# Patient Record
Sex: Male | Born: 1960
Health system: Southern US, Community
[De-identification: ages and names within clinical notes are randomized; demographics above are authoritative.]

## PROBLEM LIST (undated history)

## (undated) DIAGNOSIS — N401 Enlarged prostate with lower urinary tract symptoms: Secondary | ICD-10-CM

## (undated) DIAGNOSIS — G4730 Sleep apnea, unspecified: Secondary | ICD-10-CM

## (undated) DIAGNOSIS — R739 Hyperglycemia, unspecified: Secondary | ICD-10-CM

## (undated) DIAGNOSIS — I10 Essential (primary) hypertension: Secondary | ICD-10-CM

## (undated) DIAGNOSIS — R21 Rash and other nonspecific skin eruption: Secondary | ICD-10-CM

## (undated) DIAGNOSIS — N5082 Scrotal pain: Secondary | ICD-10-CM

## (undated) DIAGNOSIS — N138 Other obstructive and reflux uropathy: Secondary | ICD-10-CM

## (undated) DIAGNOSIS — R944 Abnormal results of kidney function studies: Secondary | ICD-10-CM

## (undated) DIAGNOSIS — N451 Epididymitis: Secondary | ICD-10-CM

## (undated) DIAGNOSIS — E663 Overweight: Secondary | ICD-10-CM

## (undated) DIAGNOSIS — J309 Allergic rhinitis, unspecified: Secondary | ICD-10-CM

## (undated) DIAGNOSIS — H9209 Otalgia, unspecified ear: Secondary | ICD-10-CM

## (undated) DIAGNOSIS — E291 Testicular hypofunction: Secondary | ICD-10-CM

## (undated) HISTORY — DX: Allergic rhinitis, unspecified: J30.9

## (undated) HISTORY — DX: Benign prostatic hyperplasia with lower urinary tract symptoms: N13.8

## (undated) HISTORY — DX: Rash and other nonspecific skin eruption: R21

## (undated) HISTORY — DX: Hyperglycemia, unspecified: R73.9

## (undated) HISTORY — DX: Testicular hypofunction: E29.1

## (undated) HISTORY — DX: Sleep apnea, unspecified: G47.30

## (undated) HISTORY — DX: Other obstructive and reflux uropathy: N40.1

## (undated) HISTORY — DX: Epididymitis: N45.1

## (undated) HISTORY — DX: Otalgia, unspecified ear: H92.09

## (undated) HISTORY — PX: WISDOM TOOTH EXTRACTION: SHX21

## (undated) HISTORY — DX: Scrotal pain: N50.82

## (undated) HISTORY — DX: Overweight: E66.3

## (undated) HISTORY — DX: Essential (primary) hypertension: I10

## (undated) HISTORY — DX: Abnormal results of kidney function studies: R94.4

---

## 2005-03-31 ENCOUNTER — Emergency Department: Payer: Self-pay | Admitting: Internal Medicine

## 2012-07-01 ENCOUNTER — Ambulatory Visit: Payer: Self-pay | Admitting: Gastroenterology

## 2013-05-19 ENCOUNTER — Encounter: Payer: Self-pay | Admitting: *Deleted

## 2013-05-20 ENCOUNTER — Encounter: Payer: Self-pay | Admitting: Cardiovascular Disease

## 2013-05-20 ENCOUNTER — Encounter (INDEPENDENT_AMBULATORY_CARE_PROVIDER_SITE_OTHER): Payer: Self-pay

## 2013-05-20 ENCOUNTER — Ambulatory Visit (INDEPENDENT_AMBULATORY_CARE_PROVIDER_SITE_OTHER): Payer: BC Managed Care – PPO | Admitting: Cardiovascular Disease

## 2013-05-20 VITALS — BP 116/79 | HR 89 | Ht 73.0 in | Wt 258.5 lb

## 2013-05-20 DIAGNOSIS — R0789 Other chest pain: Secondary | ICD-10-CM

## 2013-05-20 DIAGNOSIS — I10 Essential (primary) hypertension: Secondary | ICD-10-CM | POA: Insufficient documentation

## 2013-05-20 DIAGNOSIS — R0602 Shortness of breath: Secondary | ICD-10-CM

## 2013-05-20 MED ORDER — ASPIRIN 81 MG PO TABS: 81.0000 mg | ORAL_TABLET | Freq: Every day | ORAL | Status: DC

## 2013-05-20 NOTE — Assessment & Plan Note (Signed)
Blood pressure is controlled on current dose of lisinopril.

## 2013-05-20 NOTE — Assessment & Plan Note (Signed)
His exertional chest tightness is suggestive of class II angina. Symptoms have been stable over the last year. Physical exam is normal and baseline ECG is also normal. I advised him to start taking aspirin 81 mg once daily. I recommend proceeding with a stress echocardiogram for risk stratification.

## 2013-05-20 NOTE — Progress Notes (Signed)
Primary care physician: Dr. Jacqualine Code  HPI  This is a pleasant 53 year old man who was referred for evaluation of chest pain. He has no previous cardiac history. He was diagnosed with hypertension about one to 2 years ago and was started on lisinopril. Since that time, he reports exertional dizziness and chest tightness. The chest tightness is usually substernal with no radiation. It happens mainly if he goes up one or 2 flights of stairs or he walks uphill. It does not happen if he is walking on a flat level even brisk walking. The tightness usually last for less than half a minute and resolves with rest. It is associated with dizziness and mild shortness of breath. No orthopnea, PND or lower extremity edema. He reports that the symptoms have been stable over the last year. He is a lifelong nonsmoker and does not drink alcohol. 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 He has no family history of coronary artery disease.  No Known Allergies   Current Outpatient Prescriptions on File Prior to Visit  Medication Sig Dispense Refill  . lisinopril (PRINIVIL,ZESTRIL) 10 MG tablet Take 10 mg by mouth 2 (two) times daily.        No current facility-administered medications on file prior to visit.     Past Medical History  Diagnosis Date  . Sebaceous cyst   . Allergic rhinitis, cause unspecified   . Sprain of lumbar region   . Acute sinusitis, unspecified   . Other abnormal glucose   . Dizziness and giddiness   . Rash and other nonspecific skin eruption   . Hypertension      Past Surgical History  Procedure Laterality Date  . Wisdom tooth extraction       Family History  Problem Relation Age of Onset  . Hypertension Mother   . Diabetes Mother   . Hypertension Father   . Prostate cancer Father      History   Social History  . Marital Status: Married    Spouse Name: N/A    Number of Children: N/A  . Years of Education: N/A   Occupational History  . Not on file.     Social History Main Topics  . Smoking status: Never Smoker   . Smokeless tobacco: Not on file  . Alcohol Use: No     Comment: occasional  . Drug Use: No  . Sexual Activity: Not on file   Other Topics Concern  . Not on file   Social History Narrative  . No narrative on file     ROS A 10 point review of system was performed. It is negative other than that mentioned in the history of present illness.   PHYSICAL EXAM   BP 116/79  Pulse 89  Ht 6\' 1"  (1.854 m)  Wt 258 lb 8 oz (117.255 kg)  BMI 34.11 kg/m2 Constitutional: He is oriented to person, place, and time. He appears well-developed and well-nourished. No distress.  HENT: No nasal discharge.  Head: Normocephalic and atraumatic.  Eyes: Pupils are equal and round.  No discharge. Neck: Normal range of motion. Neck supple. No JVD present. No thyromegaly present.  Cardiovascular: Normal rate, regular rhythm, normal heart sounds. Exam reveals no gallop and no friction rub. No murmur heard.  Pulmonary/Chest: Effort normal and breath sounds normal. No stridor. No respiratory distress. He has no wheezes. He has no rales. He exhibits no tenderness.  Abdominal: Soft. Bowel sounds are normal. He exhibits no distension. There is no  tenderness. There is no rebound and no guarding.  Musculoskeletal: Normal range of motion. He exhibits no edema and no tenderness.  Neurological: He is alert and oriented to person, place, and time. Coordination normal.  Skin: Skin is warm and dry. No rash noted. He is not diaphoretic. No erythema. No pallor.  Psychiatric: He has a normal mood and affect. His behavior is normal. Judgment and thought content normal.       EKG: Normal sinus rhythm with no significant ST or T wave changes.   ASSESSMENT AND PLAN

## 2013-05-20 NOTE — Patient Instructions (Signed)
Your physician has requested that you have a stress echocardiogram within 2 weeks. For further information please visit [email or web address]. Please follow instruction sheet as given.   Your physician has recommended you make the following change in your medication:  Start Aspirin 81 mg daily   Your physician recommends that you schedule a follow-up appointment in:  1 month

## 2013-06-17 ENCOUNTER — Telehealth: Payer: Self-pay | Admitting: *Deleted

## 2013-06-17 NOTE — Telephone Encounter (Signed)
Reviewed stress echo instructions with patient  Patient verbalized understanding  

## 2013-06-17 NOTE — Telephone Encounter (Signed)
LVM to discuss Stress Echo instructions for his 5/28 appt.

## 2013-06-19 ENCOUNTER — Other Ambulatory Visit (INDEPENDENT_AMBULATORY_CARE_PROVIDER_SITE_OTHER): Payer: BC Managed Care – PPO

## 2013-06-19 DIAGNOSIS — R079 Chest pain, unspecified: Secondary | ICD-10-CM

## 2013-06-19 DIAGNOSIS — R0789 Other chest pain: Secondary | ICD-10-CM

## 2013-06-19 DIAGNOSIS — R0602 Shortness of breath: Secondary | ICD-10-CM

## 2013-06-23 ENCOUNTER — Ambulatory Visit: Payer: BC Managed Care – PPO | Admitting: Cardiovascular Disease

## 2013-06-24 ENCOUNTER — Ambulatory Visit: Payer: BC Managed Care – PPO | Admitting: Cardiovascular Disease

## 2014-02-20 ENCOUNTER — Ambulatory Visit: Payer: Self-pay

## 2014-07-20 ENCOUNTER — Encounter (INDEPENDENT_AMBULATORY_CARE_PROVIDER_SITE_OTHER): Payer: Self-pay

## 2014-07-20 ENCOUNTER — Other Ambulatory Visit: Payer: BLUE CROSS/BLUE SHIELD

## 2014-07-20 DIAGNOSIS — E291 Testicular hypofunction: Secondary | ICD-10-CM

## 2014-07-21 ENCOUNTER — Telehealth: Payer: Self-pay

## 2014-07-21 ENCOUNTER — Encounter: Payer: Self-pay | Admitting: Family Medicine

## 2014-07-21 ENCOUNTER — Ambulatory Visit (INDEPENDENT_AMBULATORY_CARE_PROVIDER_SITE_OTHER): Payer: BLUE CROSS/BLUE SHIELD | Admitting: Family Medicine

## 2014-07-21 VITALS — BP 138/88 | HR 103 | Temp 98.3°F | Ht 73.0 in | Wt 275.9 lb

## 2014-07-21 DIAGNOSIS — I10 Essential (primary) hypertension: Secondary | ICD-10-CM

## 2014-07-21 DIAGNOSIS — E291 Testicular hypofunction: Secondary | ICD-10-CM | POA: Diagnosis not present

## 2014-07-21 DIAGNOSIS — N529 Male erectile dysfunction, unspecified: Secondary | ICD-10-CM | POA: Insufficient documentation

## 2014-07-21 DIAGNOSIS — L723 Sebaceous cyst: Secondary | ICD-10-CM

## 2014-07-21 DIAGNOSIS — R7989 Other specified abnormal findings of blood chemistry: Secondary | ICD-10-CM | POA: Insufficient documentation

## 2014-07-21 DIAGNOSIS — S39012A Strain of muscle, fascia and tendon of lower back, initial encounter: Secondary | ICD-10-CM | POA: Insufficient documentation

## 2014-07-21 LAB — TESTOSTERONE: Testosterone: 174 ng/dL — ABNORMAL LOW (ref 348–1197)

## 2014-07-21 MED ORDER — LOSARTAN POTASSIUM 50 MG PO TABS: 50.0000 mg | ORAL_TABLET | Freq: Every day | ORAL | Status: DC

## 2014-07-21 NOTE — Telephone Encounter (Signed)
-----   Message from Nori Riis, PA-C sent at 07/21/2014  8:27 AM EDT ----- Patient's testosterone is still low.  He will need to talk about other options with me at an office visit.

## 2014-07-21 NOTE — Telephone Encounter (Signed)
Spoke with pt and made aware of testosterone. Pt was transferred to the front to make f/u appt. Cw,lpn

## 2014-07-21 NOTE — Progress Notes (Signed)
Name: Seth Meadows   MRN: [id number]    DOB: 12-07-1960   Date:07/21/2014       Progress Note  Subjective  Chief Complaint  Chief Complaint  Patient presents with  . Hypertension    med refill  . Establish Care    Dr Jacqualine Code pt.    Hypertension This is a chronic problem. The problem is unchanged. The problem is controlled (usually controlled at home.). Associated symptoms include malaise/fatigue. Pertinent negatives include no chest pain, headaches, peripheral edema or shortness of breath. Past treatments include ACE inhibitors. The current treatment provides moderate improvement. There are no compliance problems.  There is no history of angina, kidney disease or CAD/MI.   patient was on lisinopril and experienced some side effects, due to which she requested a change in therapy for hypertension. Dr. Jacqualine Code started him on Losartan 50 mg daily but he did not want to change the therapy at that time because he was dealing with medication management for low testosterone levels and did not want to change two medications at one time.    Past Medical History  Diagnosis Date  . Sebaceous cyst   . Allergic rhinitis, cause unspecified   . Sprain of lumbar region   . Acute sinusitis, unspecified   . Other abnormal glucose   . Dizziness and giddiness   . Rash and other nonspecific skin eruption   . Hypertension     Past Surgical History  Procedure Laterality Date  . Wisdom tooth extraction      Family History  Problem Relation Age of Onset  . Hypertension Mother   . Diabetes Mother   . Hypertension Father   . Prostate cancer Father     History   Social History  . Marital Status: Married    Spouse Name: N/A  . Number of Children: N/A  . Years of Education: N/A   Occupational History  . Not on file.   Social History Main Topics  . Smoking status: Never Smoker   . Smokeless tobacco: Not on file  . Alcohol Use: No     Comment: occasional  . Drug Use: No  . Sexual  Activity: Not on file   Other Topics Concern  . Not on file   Social History Narrative     Current outpatient prescriptions:  .  lisinopril (PRINIVIL,ZESTRIL) 10 MG tablet, Take 10 mg by mouth 2 (two) times daily. , Disp: , Rfl:  .  aspirin 81 MG tablet, Take 1 tablet (81 mg total) by mouth daily. (Patient not taking: Reported on 07/21/2014), Disp: 30 tablet, Rfl:   No Known Allergies   Review of Systems  Constitutional: Positive for malaise/fatigue.  Respiratory: Negative for shortness of breath.   Cardiovascular: Negative for chest pain.  Neurological: Negative for headaches.      Objective  Filed Vitals:   07/21/14 1419  BP: 138/88  Pulse: 103  Temp: 98.3 F (36.8 C)  TempSrc: Oral  Height: 6\' 1"  (1.854 m)  Weight: 275 lb 14.4 oz (125.147 kg)  SpO2: 96%    Physical Exam  Constitutional: He is oriented to person, place, and time and well-developed, well-nourished, and in no distress.  HENT:  Head: Normocephalic and atraumatic.  Eyes: Conjunctivae are normal. Pupils are equal, round, and reactive to light.  Cardiovascular: Normal rate and regular rhythm.   Pulmonary/Chest: Effort normal and breath sounds normal.  Abdominal: Soft. Bowel sounds are normal.  Musculoskeletal: He exhibits no edema.  Neurological: He is alert and  oriented to person, place, and time.  Skin: Skin is warm and dry.  Palpable nontender cyst on the upper back, no drainage visible.  Psychiatric: Affect normal.  Nursing note and vitals reviewed.      Recent Results (from the past 2160 hour(s))  Testosterone     Status: Abnormal   Collection Time: 07/20/14  8:44 AM  Result Value Ref Range   Testosterone 174 (L) 348 - 1197 ng/dL   Comment, Testosterone Comment     Comment: Adult male reference interval is based on a population of lean males up to 55 years old.      Assessment & Plan 1. Essential hypertension Patient will be switched to losartan 50 mg daily and he is to DC this  and Toprol and check his blood pressure regularly at home. BP logs will be reviewed in 4 weeks. - losartan (COZAAR) 50 MG tablet; Take 1 tablet (50 mg total) by mouth daily.  Dispense: 90 tablet; Refill: 0 - Comprehensive Metabolic Panel (CMET)  2. Sebaceous cyst Patient will be referred to general surgery for evaluation and possible excision of the cyst. - Ambulatory referral to General Surgery  3. Low testosterone Patient is being followed by urology.  There are no diagnoses linked to this encounter.  Tanganyika Bowlds [facility] 07/21/2014 2:32 PM

## 2014-07-22 ENCOUNTER — Encounter: Payer: Self-pay | Admitting: *Deleted

## 2014-07-22 LAB — COMPREHENSIVE METABOLIC PANEL
ALT: 29 IU/L (ref 0–44)
AST: 27 IU/L (ref 0–40)
Albumin/Globulin Ratio: 1.6 (ref 1.1–2.5)
Albumin: 4.2 g/dL (ref 3.5–5.5)
Alkaline Phosphatase: 57 IU/L (ref 39–117)
BUN/Creatinine Ratio: 15 (ref 9–20)
BUN: 17 mg/dL (ref 6–24)
Bilirubin Total: 0.3 mg/dL (ref 0.0–1.2)
CO2: 22 mmol/L (ref 18–29)
Calcium: 9.1 mg/dL (ref 8.7–10.2)
Chloride: 101 mmol/L (ref 97–108)
Creatinine, Ser: 1.13 mg/dL (ref 0.76–1.27)
GFR calc Af Amer: 84 mL/min/{1.73_m2} (ref >59)
GFR calc non Af Amer: 73 mL/min/{1.73_m2} (ref >59)
Globulin, Total: 2.6 g/dL (ref 1.5–4.5)
Glucose: 91 mg/dL (ref 65–99)
Potassium: 4.4 mmol/L (ref 3.5–5.2)
Sodium: 141 mmol/L (ref 134–144)
Total Protein: 6.8 g/dL (ref 6.0–8.5)

## 2014-07-29 ENCOUNTER — Encounter: Payer: Self-pay | Admitting: *Deleted

## 2014-07-30 ENCOUNTER — Encounter: Payer: Self-pay | Admitting: General Surgery

## 2014-07-30 ENCOUNTER — Ambulatory Visit (INDEPENDENT_AMBULATORY_CARE_PROVIDER_SITE_OTHER): Payer: BLUE CROSS/BLUE SHIELD | Admitting: Urology

## 2014-07-30 ENCOUNTER — Ambulatory Visit (INDEPENDENT_AMBULATORY_CARE_PROVIDER_SITE_OTHER): Payer: BLUE CROSS/BLUE SHIELD | Admitting: General Surgery

## 2014-07-30 ENCOUNTER — Encounter: Payer: Self-pay | Admitting: Urology

## 2014-07-30 VITALS — BP 116/72 | HR 90 | Resp 16 | Ht 73.0 in | Wt 272.0 lb

## 2014-07-30 VITALS — BP 114/76 | HR 98 | Ht 73.0 in | Wt 271.7 lb

## 2014-07-30 DIAGNOSIS — L723 Sebaceous cyst: Secondary | ICD-10-CM | POA: Diagnosis not present

## 2014-07-30 DIAGNOSIS — E291 Testicular hypofunction: Secondary | ICD-10-CM | POA: Diagnosis not present

## 2014-07-30 NOTE — Patient Instructions (Addendum)
Follow up as needed or if symptoms worsen. The patient is aware to call back for any questions or concerns.  Epidermal Cyst An epidermal cyst is sometimes called a sebaceous cyst, epidermal inclusion cyst, or infundibular cyst. These cysts usually contain a substance that looks "pasty" or "cheesy" and may have a bad smell. This substance is a protein called keratin. Epidermal cysts are usually found on the face, neck, or trunk. They may also occur in the vaginal area or other parts of the genitalia of both men and women. Epidermal cysts are usually small, painless, slow-growing bumps or lumps that move freely under the skin. It is important not to try to pop them. This may cause an infection and lead to tenderness and swelling. CAUSES  Epidermal cysts may be caused by a deep penetrating injury to the skin or a plugged hair follicle, often associated with acne. SYMPTOMS  Epidermal cysts can become inflamed and cause:  Redness.  Tenderness.  Increased temperature of the skin over the bumps or lumps.  Grayish-white, bad smelling material that drains from the bump or lump. DIAGNOSIS  Epidermal cysts are easily diagnosed by your caregiver during an exam. Rarely, a tissue sample (biopsy) may be taken to rule out other conditions that may resemble epidermal cysts. TREATMENT   Epidermal cysts often get better and disappear on their own. They are rarely ever cancerous.  If a cyst becomes infected, it may become inflamed and tender. This may require opening and draining the cyst. Treatment with antibiotics may be necessary. When the infection is gone, the cyst may be removed with minor surgery.  Small, inflamed cysts can often be treated with antibiotics or by injecting steroid medicines.  Sometimes, epidermal cysts become large and bothersome. If this happens, surgical removal in your caregiver's office may be necessary. HOME CARE INSTRUCTIONS  Only take over-the-counter or prescription  medicines as directed by your caregiver.  Take your antibiotics as directed. Finish them even if you start to feel better. SEEK MEDICAL CARE IF:   Your cyst becomes tender, red, or swollen.  Your condition is not improving or is getting worse.  You have any other questions or concerns. MAKE SURE YOU:  Understand these instructions.  Will watch your condition.  Will get help right away if you are not doing well or get worse. Document Released: 12/11/2003 Document Revised: 04/03/2011 Document Reviewed: 07/18/2010 [facility] Patient Information 2015 [place], [place]. This information is not intended to replace advice given to you by your health care provider. Make sure you discuss any questions you have with your health care provider.

## 2014-07-30 NOTE — Progress Notes (Signed)
Patient ID: Seth Meadows, male   DOB: 12-07-1960, 54 y.o.   MRN: [id number]  Chief Complaint  Patient presents with  . Other    cyst on back    HPI Seth Meadows is a 54 y.o. male here today for a evaluation of a cyst on mid upper back. Patient states he noticed for several years. The area is not draining or non-tender. He does seem to think it has gotten minimally larger over the past several years. Denies any injury or trauma to the area.  HPI  Past Medical History  Diagnosis Date  . Hypertension   . Hypogonadism in male   . Erectile dysfunction   . Earache   . Lumbar strain   . Allergic rhinitis   . Scrotal pain   . Kidney function test abnormal   . Cutaneous eruption   . Hyperglycemia   . Overweight   . BPH with obstruction/lower urinary tract symptoms   . Sleep apnea   . Epididymitis, left     Past Surgical History  Procedure Laterality Date  . Wisdom tooth extraction      Family History  Problem Relation Age of Onset  . Hypertension Mother   . Diabetes Mother   . Hypertension Father   . Prostate cancer Father   . Kidney disease Neg Hx   . Bladder Cancer Neg Hx     Social History History  Substance Use Topics  . Smoking status: Never Smoker   . Smokeless tobacco: Not on file  . Alcohol Use: No     Comment: occasional    No Known Allergies  Current Outpatient Prescriptions  Medication Sig Dispense Refill  . losartan (COZAAR) 50 MG tablet Take 1 tablet (50 mg total) by mouth daily. 90 tablet 0   No current facility-administered medications for this visit.    Review of Systems Review of Systems  Constitutional: Negative.   Respiratory: Negative.   Cardiovascular: Negative.     Blood pressure 116/72, pulse 90, resp. rate 16, height 6\' 1"  (1.854 m), weight 272 lb (123.378 kg).  Physical Exam Physical Exam  Constitutional: He is oriented to person, place, and time. He appears well-developed and well-nourished.  Eyes: Conjunctivae are normal. No  scleral icterus.  Neck: Neck supple.  Lymphadenopathy:    He has no cervical adenopathy.  Neurological: He is alert and oriented to person, place, and time.  Skin: Skin is warm and dry.  3 cm non inflamed sebaceous cyst left of mid line at T4  Psychiatric: He has a normal mood and affect.    Data Reviewed Recent PCP evaluation  Assessment    Asymptomatic sebaceous cyst of the back.    Plan    Should the area and large significantly, be uncomfortable or develop any inflammatory changes he was encouraged to call for reassessment.     Follow up as needed or if symptoms worsen. The patient is aware to call back for any questions or concerns.   PCP:  Raynelle Chary 08/01/2014, 8:32 AM

## 2014-07-30 NOTE — Progress Notes (Signed)
07/30/2014 4:53 PM   [name] [birth year] [id number]  Referring provider: Roselee Nova, MD [address] [place], [place] [address]  Chief Complaint  Patient presents with  . Hypogonadism    patient states lab labs last week androgel not working    HPI: Mr. Seth Meadows is a 54 year old white male with hypogonadism who presents today to discuss alternative treatment options because the AndroGel has not helped him to achieve therapeutic levels in  his testosterone.   His most recent testosterone is 174 ng/dL and this is with him applying 4 pumps daily of the AndroGel.   He is applying the AndroGel correctly alternating application sites and applying it after he showers.   He is also compliant with his CPAP machine.  He discussed IM injections, pellets, nasal spray and buccal mucosal administrations. He is mostly interested in the pellets.  PMH: Past Medical History  Diagnosis Date  . Hypertension   . Hypogonadism in male   . Erectile dysfunction   . Earache   . Lumbar strain   . Allergic rhinitis   . Scrotal pain   . Kidney function test abnormal   . Cutaneous eruption   . Hyperglycemia   . Overweight   . BPH with obstruction/lower urinary tract symptoms   . Sleep apnea   . Epididymitis, left     Surgical History: Past Surgical History  Procedure Laterality Date  . Wisdom tooth extraction      Home Medications:    Medication List       This list is accurate as of: 07/30/14  4:53 PM.  Always use your most recent med list.               losartan 50 MG tablet  Commonly known as:  COZAAR  Take 1 tablet (50 mg total) by mouth daily.        Allergies: No Known Allergies  Family History: Family History  Problem Relation Age of Onset  . Hypertension Mother   . Diabetes Mother   . Hypertension Father   . Prostate cancer Father   . Kidney disease Neg Hx   . Bladder Cancer Neg Hx     Social History:  reports that he has never smoked. He  does not have any smokeless tobacco history on file. He reports that he does not drink alcohol or use illicit drugs.  ROS: Urological Symptom Review  Patient is experiencing the following symptoms: Erection problems (male only)   Review of Systems  Gastrointestinal (upper)  : Negative for upper GI symptoms  Gastrointestinal (lower) : Negative for lower GI symptoms  Constitutional : Fatigue  Skin: Negative for skin symptoms  Eyes: Negative for eye symptoms  Ear/Nose/Throat : Negative for Ear/Nose/Throat symptoms  Hematologic/Lymphatic: Negative for Hematologic/Lymphatic symptoms  Cardiovascular : Negative for cardiovascular symptoms  Respiratory : Negative for respiratory symptoms  Endocrine: Negative for endocrine symptoms  Musculoskeletal: Negative for musculoskeletal symptoms  Neurological: Negative for neurological symptoms  Psychologic: Negative for psychiatric symptoms   Physical Exam: BP 114/76 mmHg  Pulse 98  Ht 6\' 1"  (1.854 m)  Wt 271 lb 11.2 oz (123.242 kg)  BMI 35.85 kg/m2    Laboratory Data: Results for orders placed or performed in visit on 07/21/14  Comprehensive Metabolic Panel (CMET)  Result Value Ref Range   Glucose 91 65 - 99 mg/dL   BUN 17 6 - 24 mg/dL   Creatinine, Ser 1.13 0.76 - 1.27 mg/dL   GFR calc  non Af Amer 73 >59 mL/min/1.73   GFR calc Af Amer 84 >59 mL/min/1.73   BUN/Creatinine Ratio 15 9 - 20   Sodium 141 134 - 144 mmol/L   Potassium 4.4 3.5 - 5.2 mmol/L   Chloride 101 97 - 108 mmol/L   CO2 22 18 - 29 mmol/L   Calcium 9.1 8.7 - 10.2 mg/dL   Total Protein 6.8 6.0 - 8.5 g/dL   Albumin 4.2 3.5 - 5.5 g/dL   Globulin, Total 2.6 1.5 - 4.5 g/dL   Albumin/Globulin Ratio 1.6 1.1 - 2.5   Bilirubin Total 0.3 0.0 - 1.2 mg/dL   Alkaline Phosphatase 57 39 - 117 IU/L   AST 27 0 - 40 IU/L   ALT 29 0 - 44 IU/L   No results found for: WBC, HGB, HCT, MCV, PLT  Lab Results  Component Value Date   CREATININE 1.13  07/21/2014    No results found for: PSA  Lab Results  Component Value Date   TESTOSTERONE 174* 07/20/2014    No results found for: HGBA1C  Urinalysis No results found for: COLORURINE, APPEARANCEUR, LABSPEC, PHURINE, GLUCOSEU, HGBUR, BILIRUBINUR, KETONESUR, PROTEINUR, UROBILINOGEN, NITRITE, LEUKOCYTESUR  Pertinent Imaging:   Assessment & Plan:    1. Hypogonadism:   Patient found the AndroGel ineffective and would like to pursue a different treatment modality.  We discussed IM injections, pellets, nasal spray and buccal mucosal administration.  He is mostly interested in the pellets.  He has filled out the Testopel reimbursement forms and we will schedule the pellet insertion in two weeks.  There are no diagnoses linked to this encounter.  No Follow-up on file.  Zara Council, [facility] [address], [place], [place] [address] [phone]

## 2014-08-12 ENCOUNTER — Telehealth: Payer: Self-pay | Admitting: *Deleted

## 2014-08-12 NOTE — Telephone Encounter (Signed)
Spoke with patient and let him know we would have to cancel his appointment for testopel on the 21st because we still don't have approval from insurance co. I let Patient know i will call them back on Friday around lunch if they have not contacted [place] with approval by then and I would call patient and inform him of progress.Patient ok with plan.

## 2014-08-13 ENCOUNTER — Ambulatory Visit: Payer: Self-pay | Admitting: Urology

## 2014-08-17 ENCOUNTER — Ambulatory Visit (INDEPENDENT_AMBULATORY_CARE_PROVIDER_SITE_OTHER): Payer: BLUE CROSS/BLUE SHIELD | Admitting: Urology

## 2014-08-17 VITALS — BP 122/78 | HR 76 | Ht 73.0 in | Wt 274.3 lb

## 2014-08-17 DIAGNOSIS — J309 Allergic rhinitis, unspecified: Secondary | ICD-10-CM | POA: Insufficient documentation

## 2014-08-17 DIAGNOSIS — R7989 Other specified abnormal findings of blood chemistry: Secondary | ICD-10-CM

## 2014-08-17 DIAGNOSIS — R739 Hyperglycemia, unspecified: Secondary | ICD-10-CM | POA: Insufficient documentation

## 2014-08-17 DIAGNOSIS — R944 Abnormal results of kidney function studies: Secondary | ICD-10-CM | POA: Insufficient documentation

## 2014-08-17 DIAGNOSIS — H9209 Otalgia, unspecified ear: Secondary | ICD-10-CM | POA: Insufficient documentation

## 2014-08-17 DIAGNOSIS — E291 Testicular hypofunction: Secondary | ICD-10-CM | POA: Diagnosis not present

## 2014-08-17 MED ORDER — TESTOSTERONE 75 MG IL PLLT
75.0000 mg | PELLET | Freq: Once | Status: AC
  Administered 2014-08-24: 75 mg

## 2014-08-17 NOTE — Progress Notes (Signed)
This is a 54 -year-old male with hypogonadism and he is managed with Testopel. He presents today for Testopel insertion.  Patient is placed on the exam table in the right lateral jackknife position.  Identified upper outer quadrant of hip for insertion; prepped area with Betadine and injected 10 cc's of Lidocaine 1% with Epinephrine to anesthetize superficially and distally along trocar tract.  Made 3 mm incision using 15 blade of scalpel; trocar with sharp ended stylet was inserted into subcutaneous tissue in line with femur. Sharp stylet was withdrawn and 6 pellets were placed into trocar well. Testopel pellets advanced into tissue using blunt ended stylet. Trocar removed and incision closed using 6 Steri-Strips. Cleansed area to remove Betadine and covered Steri-Strips with outer Band-Aid.  Careful inspection of insertion is done and patient informed of post procedure instructions.  He will return in three month for serum testosterone before 9:00am.

## 2014-08-20 ENCOUNTER — Other Ambulatory Visit: Payer: Self-pay | Admitting: Family Medicine

## 2014-08-20 ENCOUNTER — Telehealth: Payer: Self-pay

## 2014-08-20 ENCOUNTER — Ambulatory Visit
Admission: RE | Admit: 2014-08-20 | Discharge: 2014-08-20 | Disposition: A | Discharge status: Home / Self Care | Payer: BLUE CROSS/BLUE SHIELD | Source: Ambulatory Visit | Attending: Family Medicine | Admitting: Family Medicine

## 2014-08-20 ENCOUNTER — Ambulatory Visit (INDEPENDENT_AMBULATORY_CARE_PROVIDER_SITE_OTHER): Payer: BLUE CROSS/BLUE SHIELD | Admitting: Family Medicine

## 2014-08-20 ENCOUNTER — Encounter: Payer: Self-pay | Admitting: Family Medicine

## 2014-08-20 ENCOUNTER — Ambulatory Visit: Payer: BLUE CROSS/BLUE SHIELD | Admitting: Family Medicine

## 2014-08-20 VITALS — BP 120/80 | HR 75 | Temp 98.5°F | Resp 18 | Ht 73.0 in | Wt 274.7 lb

## 2014-08-20 DIAGNOSIS — M79651 Pain in right thigh: Secondary | ICD-10-CM

## 2014-08-20 DIAGNOSIS — I10 Essential (primary) hypertension: Secondary | ICD-10-CM

## 2014-08-20 MED ORDER — LOSARTAN POTASSIUM 50 MG PO TABS: 50.0000 mg | ORAL_TABLET | Freq: Every day | ORAL | Status: DC

## 2014-08-20 NOTE — Addendum Note (Signed)
Addended byManuella Ghazi, Horace Wishon A A on: 08/20/2014 04:41 PM   Modules accepted: Orders

## 2014-08-20 NOTE — Telephone Encounter (Signed)
Called patient and notified him that I have scheduled him for this ultrasound tomorrow, 08-21-2014 at 9:30am ,arrive at 9:00am, [facility].

## 2014-08-20 NOTE — Addendum Note (Signed)
Addended byManuella Ghazi, Tanisia Yokley A A on: 08/20/2014 04:57 PM   Modules accepted: Orders

## 2014-08-20 NOTE — Progress Notes (Signed)
Name: Seth Meadows   MRN: [id number]    DOB: 1960-11-11   Date:08/20/2014       Progress Note  Subjective  Chief Complaint  Chief Complaint  Patient presents with  . Follow-up    4 wk.  . Hypertension    Hypertension This is a chronic problem. The problem is controlled. Pertinent negatives include no chest pain, headaches, palpitations or shortness of breath (has shortness of breath when walking up a flight of long stairs.). Past treatments include angiotensin blockers. The current treatment provides significant improvement. There is no history of kidney disease, CAD/MI or CVA.   Right thigh pain 3 month hx of right lateral thigh pain. Started with numbness and tingling and now has a burning/sharp pain to touch while he has a dull low grade pain all day. He has been taking Aleve and took a left-over Hydrocodne last night, which seemed to help relieve the pain. No history of trauma, no symptoms of claudication, and no musculoskeletal instability  Past Medical History  Diagnosis Date  . Hypertension   . Hypogonadism in male   . Erectile dysfunction   . Earache   . Lumbar strain   . Allergic rhinitis   . Scrotal pain   . Kidney function test abnormal   . Cutaneous eruption   . Hyperglycemia   . Overweight   . BPH with obstruction/lower urinary tract symptoms   . Sleep apnea   . Epididymitis, left     Past Surgical History  Procedure Laterality Date  . Wisdom tooth extraction      Family History  Problem Relation Age of Onset  . Hypertension Mother   . Diabetes Mother   . Hypertension Father   . Prostate cancer Father   . Kidney disease Neg Hx   . Bladder Cancer Neg Hx     History   Social History  . Marital Status: Married    Spouse Name: N/A  . Number of Children: N/A  . Years of Education: N/A   Occupational History  . Not on file.   Social History Main Topics  . Smoking status: Never Smoker   . Smokeless tobacco: Not on file  . Alcohol Use: No   Comment: occasional  . Drug Use: No  . Sexual Activity: Not on file   Other Topics Concern  . Not on file   Social History Narrative     Current outpatient prescriptions:  .  losartan (COZAAR) 50 MG tablet, Take 1 tablet (50 mg total) by mouth daily., Disp: 90 tablet, Rfl: 0 .  ANDROGEL PUMP 20.25 MG/ACT (1.62%) GEL, , Disp: , Rfl: 1  Current facility-administered medications:  .  Testosterone PLLT 75 mg, 75 mg, Implant, Once, Shannon A McGowan, PA-C  No Known Allergies   Review of Systems  Respiratory: Negative for shortness of breath (has shortness of breath when walking up a flight of long stairs.).   Cardiovascular: Negative for chest pain and palpitations.  Neurological: Negative for headaches.    Objective  Filed Vitals:   08/20/14 1141  BP: 120/80  Pulse: 75  Temp: 98.5 F (36.9 C)  TempSrc: Oral  Resp: 18  Height: 6\' 1"  (1.854 m)  Weight: 274 lb 11.2 oz (124.603 kg)  SpO2: 96%    Physical Exam  Constitutional: He is oriented to person, place, and time and well-developed, well-nourished, and in no distress.  HENT:  Head: Normocephalic and atraumatic.  Eyes: Pupils are equal, round, and reactive to light.  Cardiovascular:  Normal rate and regular rhythm.   Pulmonary/Chest: Effort normal and breath sounds normal.  Musculoskeletal:       Legs: Neurological: He is alert and oriented to person, place, and time.  Skin: Skin is warm and dry.  Psychiatric: Memory, affect and judgment normal.  Nursing note and vitals reviewed.    Assessment & Plan 1. Essential hypertension Blood pressure is at goal. Continue current management. - losartan (COZAAR) 50 MG tablet; Take 1 tablet (50 mg total) by mouth daily.  Dispense: 90 tablet; Refill: 0  2. Right thigh pain We will obtain x-rays to rule out skeletal etiology and a Doppler ultrasound to rule out DVT. Follow-up in 4 weeks - DG FEMUR, MIN 2 VIEWS RIGHT; Future - Ultrasound doppler venous legs bilat;  Future - CBC with Differential - DG HIP UNILAT W OR W/O PELVIS 2-3 VIEWS RIGHT; Future    Milee Qualls [facility] 08/20/2014 11:57 AM

## 2014-08-21 ENCOUNTER — Other Ambulatory Visit: Payer: Self-pay

## 2014-08-21 ENCOUNTER — Ambulatory Visit
Admission: RE | Admit: 2014-08-21 | Discharge: 2014-08-21 | Disposition: A | Discharge status: Home / Self Care | Payer: BLUE CROSS/BLUE SHIELD | Source: Ambulatory Visit | Attending: Family Medicine | Admitting: Family Medicine

## 2014-08-21 DIAGNOSIS — M79651 Pain in right thigh: Secondary | ICD-10-CM | POA: Diagnosis not present

## 2014-08-21 LAB — CBC WITH DIFFERENTIAL/PLATELET
Basophils Absolute: 0 10*3/uL (ref 0.0–0.2)
Basos: 1 %
EOS (ABSOLUTE): 0.3 10*3/uL (ref 0.0–0.4)
Eos: 4 %
Hematocrit: 48.1 % (ref 37.5–51.0)
Hemoglobin: 16.2 g/dL (ref 12.6–17.7)
Immature Grans (Abs): 0 10*3/uL (ref 0.0–0.1)
Immature Granulocytes: 0 %
Lymphocytes Absolute: 2.6 10*3/uL (ref 0.7–3.1)
Lymphs: 39 %
MCH: 28.5 pg (ref 26.6–33.0)
MCHC: 33.7 g/dL (ref 31.5–35.7)
MCV: 85 fL (ref 79–97)
Monocytes Absolute: 0.7 10*3/uL (ref 0.1–0.9)
Monocytes: 10 %
Neutrophils Absolute: 3 10*3/uL (ref 1.4–7.0)
Neutrophils: 46 %
Platelets: 206 10*3/uL (ref 150–379)
RBC: 5.69 x10E6/uL (ref 4.14–5.80)
RDW: 13.8 % (ref 12.3–15.4)
WBC: 6.6 10*3/uL (ref 3.4–10.8)

## 2014-08-24 ENCOUNTER — Encounter: Payer: Self-pay | Admitting: Urology

## 2014-08-24 DIAGNOSIS — E291 Testicular hypofunction: Secondary | ICD-10-CM | POA: Diagnosis not present

## 2014-08-25 NOTE — Progress Notes (Signed)
Patient notified

## 2014-09-17 ENCOUNTER — Other Ambulatory Visit: Payer: BLUE CROSS/BLUE SHIELD

## 2014-09-17 ENCOUNTER — Other Ambulatory Visit: Payer: Self-pay

## 2014-09-17 DIAGNOSIS — E291 Testicular hypofunction: Secondary | ICD-10-CM

## 2014-09-18 ENCOUNTER — Telehealth: Payer: Self-pay

## 2014-09-18 DIAGNOSIS — R972 Elevated prostate specific antigen [PSA]: Secondary | ICD-10-CM

## 2014-09-18 DIAGNOSIS — E291 Testicular hypofunction: Secondary | ICD-10-CM

## 2014-09-18 LAB — TESTOSTERONE: Testosterone: 387 ng/dL (ref 348–1197)

## 2014-09-18 LAB — HEMATOCRIT: Hematocrit: 46.8 % (ref 37.5–51.0)

## 2014-09-18 NOTE — Telephone Encounter (Signed)
LMOM

## 2014-09-18 NOTE — Telephone Encounter (Signed)
-----   Message from Nori Riis, PA-C sent at 09/18/2014  8:38 AM EDT ----- Patient's labs are normal.  I will need to see him in late September or early October for an exam and blood work prior to next Testopel.  (HCT, PSA and a serum total testosterone before 9AM)

## 2014-09-18 NOTE — Telephone Encounter (Signed)
Spoke with pt in reference to lab results. Pt voiced understanding. Pt was transferred to front to make f/u appt. Orders are in for labs.

## 2014-09-21 ENCOUNTER — Ambulatory Visit (INDEPENDENT_AMBULATORY_CARE_PROVIDER_SITE_OTHER): Payer: BLUE CROSS/BLUE SHIELD | Admitting: Family Medicine

## 2014-09-21 ENCOUNTER — Encounter: Payer: Self-pay | Admitting: Family Medicine

## 2014-09-21 VITALS — BP 140/78 | HR 78 | Temp 98.6°F | Resp 18 | Ht 73.0 in | Wt 267.5 lb

## 2014-09-21 DIAGNOSIS — M5489 Other dorsalgia: Secondary | ICD-10-CM | POA: Diagnosis not present

## 2014-09-21 DIAGNOSIS — M546 Pain in thoracic spine: Secondary | ICD-10-CM

## 2014-09-21 DIAGNOSIS — M545 Low back pain, unspecified: Secondary | ICD-10-CM | POA: Insufficient documentation

## 2014-09-21 DIAGNOSIS — M25551 Pain in right hip: Secondary | ICD-10-CM | POA: Diagnosis not present

## 2014-09-21 DIAGNOSIS — G8929 Other chronic pain: Secondary | ICD-10-CM | POA: Diagnosis not present

## 2014-09-21 DIAGNOSIS — M549 Dorsalgia, unspecified: Secondary | ICD-10-CM

## 2014-09-21 NOTE — Progress Notes (Signed)
Name: Seth Meadows   MRN: [id number]    DOB: [birth year]   Date:09/21/2014       Progress Note  Subjective  Chief Complaint  Chief Complaint  Patient presents with  . Follow-up    4 wk Right leg Numb  . Hypertension    HPI Pt. Is here for follow up of right low back and right hip pain. He was here in July 2016 and had an X ray of right hip performed which showed a cystic lesion over the right femoral head. He experiences pain and numbness in the right upper thigh (from hip to knee). Pain is better from last month but still persistent. Pt. has no difficulty walking, no history of trauma to the right hip.  Past Medical History  Diagnosis Date  . Hypertension   . Hypogonadism in male   . Erectile dysfunction   . Earache   . Lumbar strain   . Allergic rhinitis   . Scrotal pain   . Kidney function test abnormal   . Cutaneous eruption   . Hyperglycemia   . Overweight   . BPH with obstruction/lower urinary tract symptoms   . Sleep apnea   . Epididymitis, left     Past Surgical History  Procedure Laterality Date  . Wisdom tooth extraction      Family History  Problem Relation Age of Onset  . Hypertension Mother   . Diabetes Mother   . Hypertension Father   . Prostate cancer Father   . Kidney disease Neg Hx   . Bladder Cancer Neg Hx     Social History   Social History  . Marital Status: Married    Spouse Name: N/A  . Number of Children: N/A  . Years of Education: N/A   Occupational History  . Not on file.   Social History Main Topics  . Smoking status: Never Smoker   . Smokeless tobacco: Not on file  . Alcohol Use: No     Comment: occasional  . Drug Use: No  . Sexual Activity: Not on file   Other Topics Concern  . Not on file   Social History Narrative     Current outpatient prescriptions:  .  ANDROGEL PUMP 20.25 MG/ACT (1.62%) GEL, , Disp: , Rfl: 1 .  losartan (COZAAR) 50 MG tablet, Take 1 tablet (50 mg total) by mouth daily., Disp: 90 tablet, Rfl:  0  No Known Allergies   ROS   Objective  Filed Vitals:   09/21/14 1224  BP: 140/78  Pulse: 78  Temp: 98.6 F (37 C)  TempSrc: Oral  Resp: 18  Height: 6\' 1"  (1.854 m)  Weight: 267 lb 8 oz (121.337 kg)  SpO2: 97%    Physical Exam  Constitutional: He is well-developed, well-nourished, and in no distress.  Musculoskeletal:       Right hip: He exhibits normal range of motion, normal strength, no tenderness and no swelling.       Back:  Intermittent sharp, piercing pain in the right side of middle back Pain and numbness in the right lateral pelvis and over the right antero-lateral thigh.  Nursing note and vitals reviewed.  Assessment & Plan  1. Right hip pain  MRI of right hip for evaluation of cystic lesion at the right femoral head seen on x-ray with persistent right hip pain.  - MR Hip Right W Wo Contrast; Future  2. Chronic mid back pain  X-ray of thoracic and lumbar spine for evaluation of intermittent  but recurrent pain in the mid to lower back.  - DG Lumbar Spine Complete; Future - DG Thoracic Spine W/Swimmers; Future   Courtany Mcmurphy [facility] 09/21/2014 12:37 PM

## 2014-10-02 ENCOUNTER — Ambulatory Visit
Admission: RE | Admit: 2014-10-02 | Discharge: 2014-10-02 | Disposition: A | Discharge status: Home / Self Care | Payer: BLUE CROSS/BLUE SHIELD | Source: Ambulatory Visit | Attending: Family Medicine | Admitting: Family Medicine

## 2014-10-02 DIAGNOSIS — G8929 Other chronic pain: Secondary | ICD-10-CM | POA: Diagnosis present

## 2014-10-02 DIAGNOSIS — M5489 Other dorsalgia: Secondary | ICD-10-CM | POA: Diagnosis present

## 2014-10-02 DIAGNOSIS — M5134 Other intervertebral disc degeneration, thoracic region: Secondary | ICD-10-CM | POA: Diagnosis not present

## 2014-10-02 DIAGNOSIS — M549 Dorsalgia, unspecified: Secondary | ICD-10-CM

## 2014-10-02 DIAGNOSIS — M546 Pain in thoracic spine: Principal | ICD-10-CM

## 2014-10-07 ENCOUNTER — Ambulatory Visit
Admission: RE | Admit: 2014-10-07 | Discharge: 2014-10-07 | Disposition: A | Discharge status: Home / Self Care | Payer: BLUE CROSS/BLUE SHIELD | Source: Ambulatory Visit | Attending: Family Medicine | Admitting: Family Medicine

## 2014-10-07 DIAGNOSIS — M25551 Pain in right hip: Secondary | ICD-10-CM | POA: Diagnosis not present

## 2014-10-07 MED ORDER — GADOBENATE DIMEGLUMINE 529 MG/ML IV SOLN
20.0000 mL | Freq: Once | INTRAVENOUS | Status: AC | PRN
  Administered 2014-10-07: 20 mL via INTRAVENOUS

## 2014-10-19 ENCOUNTER — Telehealth: Payer: Self-pay | Admitting: Family Medicine

## 2014-10-19 NOTE — Telephone Encounter (Signed)
Patient is requesting a return call for test results. Also he is requesting a refill on losartin please send to [facility]. He has appointment scheduled for this coming Thursday but he is completely out now.

## 2014-10-20 ENCOUNTER — Telehealth: Payer: Self-pay | Admitting: Family Medicine

## 2014-10-20 NOTE — Telephone Encounter (Signed)
Pt would like a call back about his test results. Pt was in a meeting when you called and could not answer his phone.

## 2014-10-21 ENCOUNTER — Other Ambulatory Visit: Payer: Self-pay | Admitting: Family Medicine

## 2014-10-21 DIAGNOSIS — M24852 Other specific joint derangements of left hip, not elsewhere classified: Secondary | ICD-10-CM

## 2014-10-21 NOTE — Telephone Encounter (Signed)
Results have been reported and referral has been ordered

## 2014-10-22 ENCOUNTER — Ambulatory Visit (INDEPENDENT_AMBULATORY_CARE_PROVIDER_SITE_OTHER): Payer: BLUE CROSS/BLUE SHIELD | Admitting: Family Medicine

## 2014-10-22 ENCOUNTER — Encounter: Payer: Self-pay | Admitting: Family Medicine

## 2014-10-22 VITALS — BP 134/78 | HR 80 | Temp 97.5°F | Resp 16 | Ht 73.0 in | Wt 265.3 lb

## 2014-10-22 DIAGNOSIS — M25551 Pain in right hip: Secondary | ICD-10-CM

## 2014-10-22 DIAGNOSIS — M47816 Spondylosis without myelopathy or radiculopathy, lumbar region: Secondary | ICD-10-CM

## 2014-10-22 DIAGNOSIS — M479 Spondylosis, unspecified: Secondary | ICD-10-CM

## 2014-10-22 DIAGNOSIS — I10 Essential (primary) hypertension: Secondary | ICD-10-CM | POA: Diagnosis not present

## 2014-10-22 NOTE — Progress Notes (Signed)
Referral appointment has been scheduled per [facility]

## 2014-10-22 NOTE — Progress Notes (Signed)
Name: Seth Meadows   MRN: [id number]    DOB: December 04, 1960   Date:10/22/2014       Progress Note  Subjective  Chief Complaint  Chief Complaint  Patient presents with  . Pain    pt here for 55month folllow up    HPI Pt. Is here for follow up of MRI of Right hip. He has been experiencing intermittent low back and right hip pain. Occasionally, his right thigh also goes numb. His MRI showed normal right hip and left hip paralabral cyst suggesting an underlying tear. He has been referred to orthopedics for further evaluation.  Past Medical History  Diagnosis Date  . Hypertension   . Hypogonadism in male   . Erectile dysfunction   . Earache   . Lumbar strain   . Allergic rhinitis   . Scrotal pain   . Kidney function test abnormal   . Cutaneous eruption   . Hyperglycemia   . Overweight   . BPH with obstruction/lower urinary tract symptoms   . Sleep apnea   . Epididymitis, left     Past Surgical History  Procedure Laterality Date  . Wisdom tooth extraction      Family History  Problem Relation Age of Onset  . Hypertension Mother   . Diabetes Mother   . Hypertension Father   . Prostate cancer Father   . Kidney disease Neg Hx   . Bladder Cancer Neg Hx     Social History   Social History  . Marital Status: Married    Spouse Name: N/A  . Number of Children: N/A  . Years of Education: N/A   Occupational History  . Not on file.   Social History Main Topics  . Smoking status: Never Smoker   . Smokeless tobacco: Not on file  . Alcohol Use: No     Comment: occasional  . Drug Use: No  . Sexual Activity: Not on file   Other Topics Concern  . Not on file   Social History Narrative    Current outpatient prescriptions:  .  ANDROGEL PUMP 20.25 MG/ACT (1.62%) GEL, , Disp: , Rfl: 1 .  losartan (COZAAR) 50 MG tablet, Take 1 tablet (50 mg total) by mouth daily., Disp: 90 tablet, Rfl: 0  No Known Allergies   Review of Systems  Musculoskeletal: Positive for myalgias,  back pain and joint pain.    Objective  Filed Vitals:   10/22/14 1200  BP: 134/78  Pulse: 80  Temp: 97.5 F (36.4 C)  Resp: 16  Height: 6\' 1"  (1.854 m)  Weight: 265 lb 5 oz (120.345 kg)  SpO2: 96%    Physical Exam  Constitutional: He is oriented to person, place, and time and well-developed, well-nourished, and in no distress.  HENT:  Head: Normocephalic and atraumatic.  Cardiovascular: Normal rate and regular rhythm.   Pulmonary/Chest: Effort normal and breath sounds normal.  Musculoskeletal:       Lumbar back: He exhibits normal range of motion, no tenderness, no pain and no spasm.  Neurological: He is alert and oriented to person, place, and time.  Nursing note and vitals reviewed.   Assessment & Plan  1. Arthritis of lumbar spine Current symptoms. Recommended OTC Aleve as needed for now. Follow-up in 3 months.  2. Right hip pain Referral to orthopedics for evaluation of left hip paralabral cyst. Right hip appears normal. Patient was reassured.  3. Essential hypertension Blood pressure is stable and controlled on present therapy.     Dossie Der  [facility] 10/22/2014 12:22 PM

## 2014-10-27 ENCOUNTER — Other Ambulatory Visit: Payer: BLUE CROSS/BLUE SHIELD

## 2014-10-27 DIAGNOSIS — E291 Testicular hypofunction: Secondary | ICD-10-CM

## 2014-10-27 DIAGNOSIS — R972 Elevated prostate specific antigen [PSA]: Secondary | ICD-10-CM

## 2014-10-28 LAB — PSA: Prostate Specific Ag, Serum: 1.5 ng/mL (ref 0.0–4.0)

## 2014-10-28 LAB — HEMATOCRIT: Hematocrit: 47.9 % (ref 37.5–51.0)

## 2014-10-28 LAB — TESTOSTERONE: Testosterone: 348 ng/dL (ref 348–1197)

## 2014-10-29 ENCOUNTER — Ambulatory Visit (INDEPENDENT_AMBULATORY_CARE_PROVIDER_SITE_OTHER): Payer: BLUE CROSS/BLUE SHIELD | Admitting: Urology

## 2014-10-29 ENCOUNTER — Encounter: Payer: Self-pay | Admitting: Urology

## 2014-10-29 VITALS — BP 134/86 | HR 91 | Ht 73.0 in | Wt 266.5 lb

## 2014-10-29 DIAGNOSIS — E291 Testicular hypofunction: Secondary | ICD-10-CM

## 2014-10-29 DIAGNOSIS — N528 Other male erectile dysfunction: Secondary | ICD-10-CM | POA: Diagnosis not present

## 2014-10-29 DIAGNOSIS — N529 Male erectile dysfunction, unspecified: Secondary | ICD-10-CM

## 2014-10-29 MED ORDER — SILDENAFIL CITRATE 100 MG PO TABS
100.0000 mg | ORAL_TABLET | Freq: Every day | ORAL | Status: DC | PRN
Start: 2014-10-29 — End: 2017-03-08

## 2014-10-29 NOTE — Progress Notes (Signed)
10/29/2014 4:38 PM   Seth Meadows 03-30-1960 [id number]  Referring provider: Roselee Nova, MD [address] [place], [place] [address]  No chief complaint on file.   HPI: Patient is a 54 year old white male with hypogonadism and erectile dysfunction who presents today for 6 month follow-up.  Patient is complains of reduced libido and  erectile dysfunction.  He is also experiencing a reduced incidence of spontaneous erections, a reduced need to shave, an increase of body fat, disturbances in sleep patterns (patient has sleep apnea, but he using his CPAP),  decreased energy and motivation.  His pretreatment testosterone level was 309 ng/dL on 01/28/2014.   He is currently managing his hypogonadism with Testopel.    His erectile dysfunction is worsening. It is no longer responsive to the testosterone treatment. He is suffering from blood pressure issues and the above-mentioned sleep apnea.  He is having spontaneous erections in the morning. He would like to try a heating 5 inhibitor at this time.  PMH: Past Medical History  Diagnosis Date  . Hypertension   . Hypogonadism in male   . Erectile dysfunction   . Earache   . Lumbar strain   . Allergic rhinitis   . Scrotal pain   . Kidney function test abnormal   . Cutaneous eruption   . Hyperglycemia   . Overweight   . BPH with obstruction/lower urinary tract symptoms   . Sleep apnea   . Epididymitis, left     Surgical History: Past Surgical History  Procedure Laterality Date  . Wisdom tooth extraction      Home Medications:    Medication List       This list is accurate as of: 10/29/14  4:38 PM.  Always use your most recent med list.               TESTOPEL 75 MG Pllt  Generic drug:  Testosterone  75 mg by Implant route every 3 (three) months. 6 pellets at a time     ANDROGEL PUMP 20.25 MG/ACT (1.62%) Gel  Generic drug:  Testosterone     losartan 50 MG tablet  Commonly known as:  COZAAR  Take 1  tablet (50 mg total) by mouth daily.     sildenafil 100 MG tablet  Commonly known as:  VIAGRA  Take 1 tablet (100 mg total) by mouth daily as needed for erectile dysfunction.        Allergies: No Known Allergies  Family History: Family History  Problem Relation Age of Onset  . Hypertension Mother   . Diabetes Mother   . Hypertension Father   . Prostate cancer Father   . Kidney disease Neg Hx   . Bladder Cancer Neg Hx     Social History:  reports that he has never smoked. He does not have any smokeless tobacco history on file. He reports that he does not drink alcohol or use illicit drugs.  ROS: UROLOGY Frequent Urination?: No Hard to postpone urination?: No Burning/pain with urination?: No Get up at night to urinate?: No Leakage of urine?: No Urine stream starts and stops?: No Trouble starting stream?: No Do you have to strain to urinate?: No Blood in urine?: No Urinary tract infection?: No Sexually transmitted disease?: No Injury to kidneys or bladder?: No Painful intercourse?: No Weak stream?: No Erection problems?: Yes Penile pain?: No  Gastrointestinal Nausea?: No Vomiting?: No Indigestion/heartburn?: No Diarrhea?: No Constipation?: No  Constitutional Fever: No Night sweats?: No Weight loss?:  No Fatigue?: No  Skin Skin rash/lesions?: No Itching?: No  Eyes Blurred vision?: No Double vision?: No  Ears/Nose/Throat Sore throat?: No Sinus problems?: No  Hematologic/Lymphatic Swollen glands?: No Easy bruising?: No  Cardiovascular Leg swelling?: No Chest pain?: No  Respiratory Cough?: No Shortness of breath?: No  Endocrine Excessive thirst?: No  Musculoskeletal Back pain?: No Joint pain?: No  Neurological Headaches?: No Dizziness?: No  Psychologic Depression?: No Anxiety?: No  Physical Exam: BP 134/86 mmHg  Pulse 91  Ht 6\' 1"  (1.854 m)  Wt 266 lb 8 oz (120.884 kg)  BMI 35.17 kg/m2  GU: Patient with circumcised phallus.   Urethral meatus is patent.  No penile discharge. No penile lesions or rashes. Scrotum without lesions, cysts, rashes and/or edema.  Testicles are located scrotally bilaterally. No masses are appreciated in the testicles. Left and right epididymis are normal. Rectal: Patient with  normal sphincter tone. Perineum without scarring or rashes. No rectal masses are appreciated. Prostate is approximately 50 grams, no nodules are appreciated. Seminal vesicles are normal.    Laboratory Data: Lab Results  Component Value Date   WBC 6.6 08/20/2014   HCT 47.9 10/27/2014    Lab Results  Component Value Date   CREATININE 1.13 07/21/2014    Lab Results  Component Value Date   PSA 1.5 10/27/2014    Lab Results  Component Value Date   TESTOSTERONE 348 10/27/2014     Assessment & Plan:    1. Erectile dysfunction of organic origin:   He is warned not to take the Viagra with medications that contain nitrates.  I also advised him of the side effects, such as: headache, flushing, dyspepsia, abnormal vision, nasal congestion, back pain, myalgia, nausea, dizziness, and rash.  - sildenafil (VIAGRA) 100 MG tablet; Take 1 tablet (100 mg total) by mouth daily as needed for erectile dysfunction.  Dispense: 10 tablet; Refill: 12  2. Hypogonadism:    Patient would like to continue with the Testopel.  He will schedule an insertion in the next 2 weeks.    Return for schedule Testopel.  Zara Council, [facility] [address] [place], [place] [address] [phone]

## 2014-11-19 ENCOUNTER — Ambulatory Visit (INDEPENDENT_AMBULATORY_CARE_PROVIDER_SITE_OTHER): Payer: BLUE CROSS/BLUE SHIELD | Admitting: Urology

## 2014-11-19 ENCOUNTER — Encounter: Payer: Self-pay | Admitting: Urology

## 2014-11-19 VITALS — BP 158/100 | HR 80 | Ht 73.0 in | Wt 265.0 lb

## 2014-11-19 DIAGNOSIS — E291 Testicular hypofunction: Secondary | ICD-10-CM | POA: Diagnosis not present

## 2014-11-19 MED ORDER — TESTOSTERONE 75 MG IL PLLT
75.0000 mg | PELLET | Freq: Once | Status: AC
  Administered 2014-11-19: 75 mg

## 2014-11-20 NOTE — Progress Notes (Signed)
This is a 54 -year-old male with hypogonadism and he is managed with Testopel. He presents today for Testopel insertion.  Patient is placed on the exam table in the right lateral jackknife position.  Identified upper outer quadrant of hip for insertion; prepped area with Betadine and injected 10 cc's of Lidocaine 1% with Epinephrine to anesthetize superficially and distally along trocar tract.  Made 3 mm incision using 15 blade of scalpel; trocar with sharp ended stylet was inserted into subcutaneous tissue in line with femur. Sharp stylet was withdrawn and 6 pellets were placed into trocar well. Testopel pellets advanced into tissue using blunt ended stylet. Trocar removed and incision closed using 6 Steri-Strips. Cleansed area to remove Betadine and covered Steri-Strips with outer Band-Aid.  Careful inspection of insertion is done and patient informed of post procedure instructions.  He will return in one month for serum testosterone before 9:00am. 

## 2014-12-22 ENCOUNTER — Encounter: Payer: Self-pay | Admitting: Family Medicine

## 2014-12-22 ENCOUNTER — Ambulatory Visit (INDEPENDENT_AMBULATORY_CARE_PROVIDER_SITE_OTHER): Payer: BLUE CROSS/BLUE SHIELD | Admitting: Family Medicine

## 2014-12-22 ENCOUNTER — Other Ambulatory Visit: Payer: BLUE CROSS/BLUE SHIELD

## 2014-12-22 VITALS — BP 146/110 | HR 76 | Temp 97.7°F | Resp 16 | Ht 73.0 in | Wt 266.5 lb

## 2014-12-22 DIAGNOSIS — M479 Spondylosis, unspecified: Secondary | ICD-10-CM

## 2014-12-22 DIAGNOSIS — I10 Essential (primary) hypertension: Secondary | ICD-10-CM | POA: Diagnosis not present

## 2014-12-22 DIAGNOSIS — E291 Testicular hypofunction: Secondary | ICD-10-CM

## 2014-12-22 DIAGNOSIS — M47816 Spondylosis without myelopathy or radiculopathy, lumbar region: Secondary | ICD-10-CM

## 2014-12-22 MED ORDER — TRAMADOL HCL 50 MG PO TABS: 50.0000 mg | ORAL_TABLET | Freq: Two times a day (BID) | ORAL | Status: DC | PRN

## 2014-12-22 MED ORDER — LOSARTAN POTASSIUM 100 MG PO TABS: 100.0000 mg | ORAL_TABLET | Freq: Every day | ORAL | Status: DC

## 2014-12-22 NOTE — Progress Notes (Signed)
Name: Seth Meadows   MRN: [id number]    DOB: [birth year]   Date:12/22/2014       Progress Note  Subjective  Chief Complaint  Chief Complaint  Patient presents with  . Follow-up    2 mo    Hypertension This is a chronic problem. The problem is unchanged. The problem is uncontrolled (BP elevated, reports he did not get a good night's sleep last night). Pertinent negatives include no blurred vision, chest pain, headaches, malaise/fatigue, palpitations or shortness of breath. (He shouldn't woke up earlier this morning with a nosebleed, something which is unusual for him. Nosebleed has not recurred since then.) Past treatments include angiotensin blockers. The current treatment provides moderate improvement. There are no compliance problems (May have missed a few doses while he was on vacation.).  There is no history of kidney disease, CAD/MI or CVA.  Back Pain This is a chronic problem. The problem is unchanged. The pain is present in the lumbar spine. The quality of the pain is described as shooting (sharp pain). The pain does not radiate. The pain is at a severity of 10/10 (when it coms on, pain is described as 10/10, but it does not last very long and gradually goes away). The pain is severe. The symptoms are aggravated by bending and position (comes on when trying to stand from a seated position, once he stands up and starts walking, it gradually goes away.). Pertinent negatives include no chest pain, fever, headaches, leg pain, numbness, paresis, paresthesias or weakness. He has tried analgesics (OTC Tylenol and Aleve) for the symptoms. The treatment provided no relief (seen by orthopedics but not started on any medication, advised stretching.).    Past Medical History  Diagnosis Date  . Hypertension   . Hypogonadism in male   . Erectile dysfunction   . Earache   . Lumbar strain   . Allergic rhinitis   . Scrotal pain   . Kidney function test abnormal   . Cutaneous eruption   .  Hyperglycemia   . Overweight   . BPH with obstruction/lower urinary tract symptoms   . Sleep apnea   . Epididymitis, left     Past Surgical History  Procedure Laterality Date  . Wisdom tooth extraction      Family History  Problem Relation Age of Onset  . Hypertension Mother   . Diabetes Mother   . Hypertension Father   . Prostate cancer Father   . Kidney disease Neg Hx   . Bladder Cancer Neg Hx     Social History   Social History  . Marital Status: Married    Spouse Name: N/A  . Number of Children: N/A  . Years of Education: N/A   Occupational History  . Not on file.   Social History Main Topics  . Smoking status: Never Smoker   . Smokeless tobacco: Not on file  . Alcohol Use: No     Comment: occasional  . Drug Use: No  . Sexual Activity: Not on file   Other Topics Concern  . Not on file   Social History Narrative    Current outpatient prescriptions:  .  losartan (COZAAR) 50 MG tablet, Take 1 tablet (50 mg total) by mouth daily., Disp: 90 tablet, Rfl: 0 .  sildenafil (VIAGRA) 100 MG tablet, Take 1 tablet (100 mg total) by mouth daily as needed for erectile dysfunction., Disp: 10 tablet, Rfl: 12 .  Testosterone (TESTOPEL) 75 MG PLLT, 75 mg by Implant route every 3 (  three) months. 6 pellets at a time, Disp: , Rfl:   No Known Allergies   Review of Systems  Constitutional: Negative for fever, chills and malaise/fatigue.  HENT: Negative for ear pain.   Eyes: Negative for blurred vision and double vision.  Respiratory: Negative for shortness of breath.   Cardiovascular: Negative for chest pain and palpitations.  Musculoskeletal: Positive for back pain.  Neurological: Negative for weakness, numbness, headaches and paresthesias.   Objective  Filed Vitals:   12/22/14 1204  BP: 150/99  Pulse: 76  Temp: 97.7 F (36.5 C)  TempSrc: Oral  Resp: 16  Height: 6\' 1"  (1.854 m)  Weight: 266 lb 8 oz (120.884 kg)  SpO2: 98%    Physical Exam  Constitutional:  He is well-developed, well-nourished, and in no distress.  HENT:  Head: Normocephalic and atraumatic.  Nose: No mucosal edema.  Dried clotted blood in the right nasal cavity.  Eyes: Pupils are equal, round, and reactive to light.  Cardiovascular: Normal rate, regular rhythm and normal heart sounds.   Pulmonary/Chest: Effort normal and breath sounds normal.  Musculoskeletal:       Lumbar back: Normal.  Nursing note and vitals reviewed.    Assessment & Plan  1. Essential hypertension  BP elevated and worse on manual repeat. We will increase losartan to 100 mg daily. Patient advised to check his blood pressure regularly and if his blood pressure is systemically below his usual reading, he should decrease the dose in half at his usual dosage of 50 mg daily. Verbalized agreement. Recheck BP logs in one month.  - losartan (COZAAR) 100 MG tablet; Take 1 tablet (100 mg total) by mouth daily.  Dispense: 30 tablet; Refill: 0  2. Arthritis of lumbar spine Orthopedist has recommended stretching for lower back pain. We will start on tramadol 50 mg taken twice daily as needed. Follow-up in one month. - traMADol (ULTRAM) 50 MG tablet; Take 1 tablet (50 mg total) by mouth every 12 (twelve) hours as needed. Start with Tramadol 25 mg q12hr PRN x 1 week.  Dispense: 60 tablet; Refill: 0   Ruari Mudgett [facility] 12/22/2014 12:15 PM

## 2014-12-23 ENCOUNTER — Telehealth: Payer: Self-pay

## 2014-12-23 DIAGNOSIS — E291 Testicular hypofunction: Secondary | ICD-10-CM

## 2014-12-23 LAB — TESTOSTERONE: Testosterone: 454 ng/dL (ref 348–1197)

## 2014-12-23 NOTE — Telephone Encounter (Signed)
Spoke with pt in reference to labs and testopel. Pt will RTC 02/19/15 for labs and paperwork.

## 2014-12-23 NOTE — Telephone Encounter (Signed)
LMOM

## 2014-12-23 NOTE — Telephone Encounter (Signed)
-----   Message from Nori Riis, PA-C sent at 12/23/2014  8:27 AM EST ----- Patient's testosterone is within normal limits.  He will need to return at the end of January for HCT, lipids and a testosterone.  He will most likely need to fill out another Testopel form prior to his next insertion as well.

## 2014-12-24 ENCOUNTER — Telehealth: Payer: Self-pay

## 2014-12-24 ENCOUNTER — Telehealth: Payer: Self-pay | Admitting: Family Medicine

## 2014-12-24 DIAGNOSIS — M47816 Spondylosis without myelopathy or radiculopathy, lumbar region: Secondary | ICD-10-CM

## 2014-12-24 NOTE — Telephone Encounter (Signed)
-----   Message from Nori Riis, PA-C sent at 12/23/2014  8:27 AM EST ----- Patient's testosterone is within normal limits.  He will need to return at the end of January for HCT, lipids and a testosterone.  He will most likely need to fill out another Testopel form prior to his next insertion as well.

## 2014-12-24 NOTE — Telephone Encounter (Signed)
Pt called and states pharmacy did not receive his Tramodol and that RX has to be picked up. Pt will come by once we call him to get it.

## 2014-12-25 MED ORDER — TRAMADOL HCL 50 MG PO TABS: 50.0000 mg | ORAL_TABLET | Freq: Two times a day (BID) | ORAL | Status: DC | PRN

## 2014-12-25 NOTE — Telephone Encounter (Signed)
Prescription is ready for pickup and patient has been notified

## 2014-12-25 NOTE — Telephone Encounter (Signed)
Called patient and left a voice message. Prescription reprinted and is ready for pickup

## 2014-12-25 NOTE — Telephone Encounter (Signed)
Correction prescription is not ready for pickup, note has been routed to Dr. Manuella Ghazi for approval will notify patient once it has been approved by Dr. Manuella Ghazi

## 2015-01-27 ENCOUNTER — Encounter: Payer: Self-pay | Admitting: Family Medicine

## 2015-01-27 ENCOUNTER — Ambulatory Visit (INDEPENDENT_AMBULATORY_CARE_PROVIDER_SITE_OTHER): Payer: BLUE CROSS/BLUE SHIELD | Admitting: Family Medicine

## 2015-01-27 VITALS — BP 138/89 | HR 72 | Temp 97.4°F | Resp 17 | Ht 73.0 in | Wt 272.1 lb

## 2015-01-27 DIAGNOSIS — I10 Essential (primary) hypertension: Secondary | ICD-10-CM

## 2015-01-27 MED ORDER — AMLODIPINE BESYLATE 5 MG PO TABS: 5.0000 mg | ORAL_TABLET | Freq: Every day | ORAL | Status: DC

## 2015-01-27 NOTE — Progress Notes (Signed)
Name: Seth Meadows   MRN: [id number]    DOB: 05/02/1960   Date:01/27/2015       Progress Note  Subjective  Chief Complaint  Chief Complaint  Patient presents with  . Follow-up    1 mo    Hypertension This is a chronic problem. The problem is controlled. Pertinent negatives include no blurred vision, chest pain, headaches, malaise/fatigue, orthopnea, palpitations, shortness of breath or sweats. Past treatments include angiotensin blockers. Compliance problems include medication side effects (SOmetimes has transient dizziness especially after exertion such as with a long flight of stairs or after working out in the gym, this has gotten somewhat worse after Losartan was increased to 100mg  daily.).  There is no history of kidney disease, CAD/MI or CVA.    Past Medical History  Diagnosis Date  . Hypertension   . Hypogonadism in male   . Erectile dysfunction   . Earache   . Lumbar strain   . Allergic rhinitis   . Scrotal pain   . Kidney function test abnormal   . Cutaneous eruption   . Hyperglycemia   . Overweight   . BPH with obstruction/lower urinary tract symptoms   . Sleep apnea   . Epididymitis, left     Past Surgical History  Procedure Laterality Date  . Wisdom tooth extraction      Family History  Problem Relation Age of Onset  . Hypertension Mother   . Diabetes Mother   . Hypertension Father   . Prostate cancer Father   . Kidney disease Neg Hx   . Bladder Cancer Neg Hx     Social History   Social History  . Marital Status: Married    Spouse Name: N/A  . Number of Children: N/A  . Years of Education: N/A   Occupational History  . Not on file.   Social History Main Topics  . Smoking status: Never Smoker   . Smokeless tobacco: Not on file  . Alcohol Use: No     Comment: occasional  . Drug Use: No  . Sexual Activity: Not on file   Other Topics Concern  . Not on file   Social History Narrative     Current outpatient prescriptions:  .  losartan  (COZAAR) 100 MG tablet, Take 1 tablet (100 mg total) by mouth daily., Disp: 30 tablet, Rfl: 0 .  sildenafil (VIAGRA) 100 MG tablet, Take 1 tablet (100 mg total) by mouth daily as needed for erectile dysfunction., Disp: 10 tablet, Rfl: 12 .  Testosterone (TESTOPEL) 75 MG PLLT, 75 mg by Implant route every 3 (three) months. 6 pellets at a time, Disp: , Rfl:  .  traMADol (ULTRAM) 50 MG tablet, Take 1 tablet (50 mg total) by mouth every 12 (twelve) hours as needed. Start with Tramadol 25 mg q12hr PRN x 1 week., Disp: 60 tablet, Rfl: 0  No Known Allergies   Review of Systems  Constitutional: Negative for malaise/fatigue.  Eyes: Negative for blurred vision.  Respiratory: Negative for shortness of breath.   Cardiovascular: Negative for chest pain, palpitations and orthopnea.  Neurological: Positive for dizziness. Negative for headaches.     Objective  Filed Vitals:   01/27/15 1215  BP: 138/89  Pulse: 72  Temp: 97.4 F (36.3 C)  TempSrc: Oral  Resp: 17  Height: 6\' 1"  (1.854 m)  Weight: 272 lb 1.6 oz (123.424 kg)  SpO2: 96%    Physical Exam  Constitutional: He is well-developed, well-nourished, and in no distress.  HENT:  Head: Normocephalic and atraumatic.  Right Ear: Tympanic membrane and ear canal normal.  Left Ear: Tympanic membrane and ear canal normal.  Eyes: Pupils are equal, round, and reactive to light.  Cardiovascular: Normal rate, regular rhythm and normal heart sounds.   No murmur heard. Carotid auscultation reveals no bruits  Pulmonary/Chest: Effort normal and breath sounds normal.  Musculoskeletal:       Right ankle: He exhibits no swelling.       Left ankle: He exhibits no swelling.  Nursing note and vitals reviewed.   Assessment & Plan  1. Essential hypertension DC losartan due to side effects of dizziness, worse after dosage of losartan was increased. We'll start on Norvasc 5 mg daily. Repeat BP follow-up in one month. - amLODipine (NORVASC) 5 MG tablet;  Take 1 tablet (5 mg total) by mouth daily.  Dispense: 90 tablet; Refill: 0   Kealan Buchan Asad A. [facility] 01/27/2015 12:18 PM

## 2015-01-29 ENCOUNTER — Encounter: Payer: Self-pay | Admitting: Family Medicine

## 2015-02-19 ENCOUNTER — Ambulatory Visit: Payer: BLUE CROSS/BLUE SHIELD | Admitting: *Deleted

## 2015-02-19 DIAGNOSIS — E291 Testicular hypofunction: Secondary | ICD-10-CM

## 2015-02-19 NOTE — Progress Notes (Signed)
Patient was here for labwork. I gave patient his new Testopel form to fill out, patient completed form and it was faxed for authorization approval. Patient is on the schedule for Thursday February 2 at 4:00. Patient understands if approval not obtained by then that appointment will need to be rescheduled.

## 2015-02-20 LAB — LIPID PANEL
Chol/HDL Ratio: 4.5 ratio units (ref 0.0–5.0)
Cholesterol, Total: 175 mg/dL (ref 100–199)
HDL: 39 mg/dL — ABNORMAL LOW (ref >39)
LDL Calculated: 114 mg/dL — ABNORMAL HIGH (ref 0–99)
Triglycerides: 108 mg/dL (ref 0–149)
VLDL Cholesterol Cal: 22 mg/dL (ref 5–40)

## 2015-02-20 LAB — TESTOSTERONE: Testosterone: 302 ng/dL — ABNORMAL LOW (ref 348–1197)

## 2015-02-20 LAB — HEMATOCRIT: Hematocrit: 45.4 % (ref 37.5–51.0)

## 2015-02-25 ENCOUNTER — Other Ambulatory Visit: Payer: Self-pay

## 2015-02-25 ENCOUNTER — Encounter: Payer: Self-pay | Admitting: Urology

## 2015-02-25 ENCOUNTER — Ambulatory Visit (INDEPENDENT_AMBULATORY_CARE_PROVIDER_SITE_OTHER): Payer: BLUE CROSS/BLUE SHIELD | Admitting: Urology

## 2015-02-25 ENCOUNTER — Ambulatory Visit: Payer: BLUE CROSS/BLUE SHIELD | Admitting: Urology

## 2015-02-25 VITALS — BP 142/96 | HR 73 | Ht 73.0 in | Wt 260.4 lb

## 2015-02-25 DIAGNOSIS — E291 Testicular hypofunction: Secondary | ICD-10-CM

## 2015-02-25 MED ORDER — TESTOSTERONE 75 MG IL PLLT
75.0000 mg | PELLET | Freq: Once | Status: AC
  Administered 2015-02-25: 75 mg

## 2015-02-25 NOTE — Progress Notes (Signed)
This is a 54 -year-old male with hypogonadism and he is managed with Testopel. He presents today for Testopel insertion.  Patient is placed on the exam table in the right lateral jackknife position.  Identified upper outer quadrant of hip for insertion; prepped area with Betadine and injected 10 cc's of Lidocaine 1% with Epinephrine to anesthetize superficially and distally along trocar tract.  Made 3 mm incision using 15 blade of scalpel; trocar with sharp ended stylet was inserted into subcutaneous tissue in line with femur. Sharp stylet was withdrawn and 6 pellets were placed into trocar well. Testopel pellets advanced into tissue using blunt ended stylet. Trocar removed and incision closed using 6 Steri-Strips. Cleansed area to remove Betadine and covered Steri-Strips with outer Band-Aid.  Careful inspection of insertion is done and patient informed of post procedure instructions.  He will return in two month for serum testosterone before 9:00am.

## 2015-03-01 ENCOUNTER — Ambulatory Visit (INDEPENDENT_AMBULATORY_CARE_PROVIDER_SITE_OTHER): Payer: BLUE CROSS/BLUE SHIELD | Admitting: Family Medicine

## 2015-03-01 ENCOUNTER — Encounter: Payer: Self-pay | Admitting: Family Medicine

## 2015-03-01 VITALS — BP 124/82 | HR 75 | Temp 98.2°F | Resp 14 | Wt 264.0 lb

## 2015-03-01 DIAGNOSIS — E785 Hyperlipidemia, unspecified: Secondary | ICD-10-CM | POA: Insufficient documentation

## 2015-03-01 DIAGNOSIS — I10 Essential (primary) hypertension: Secondary | ICD-10-CM

## 2015-03-01 MED ORDER — AMLODIPINE BESYLATE 2.5 MG PO TABS: 2.5000 mg | ORAL_TABLET | Freq: Every day | ORAL | Status: DC

## 2015-03-01 NOTE — Progress Notes (Signed)
Name: Seth Meadows   MRN: [id number]    DOB: [birth year]   Date:03/01/2015       Progress Note  Subjective  Chief Complaint  Chief Complaint  Patient presents with  . Follow-up    1 month  . Hypertension    stopped Losartan at last visit due to dizziness and was started on Norvasc.  Pt states Norvasc was causing low blood pressure and dizziness, so cut it in half and has been doing better.    Hypertension This is a chronic problem. The problem is controlled. Pertinent negatives include no blurred vision, chest pain, headaches, palpitations or shortness of breath. Past treatments include calcium channel blockers. There are no compliance problems (Norvasc was started at 5mg , caused BP to be lower than usual around 105/60, cut pill in half and his BP has been stable now.).  There is no history of kidney disease, CAD/MI or CVA.    Past Medical History  Diagnosis Date  . Hypertension   . Hypogonadism in male   . Earache   . Allergic rhinitis   . Scrotal pain   . Kidney function test abnormal   . Cutaneous eruption   . Hyperglycemia   . Overweight   . BPH with obstruction/lower urinary tract symptoms   . Sleep apnea   . Epididymitis, left     Past Surgical History  Procedure Laterality Date  . Wisdom tooth extraction      Family History  Problem Relation Age of Onset  . Hypertension Mother   . Diabetes Mother   . Hypertension Father   . Prostate cancer Father   . Kidney disease Neg Hx   . Bladder Cancer Neg Hx     Social History   Social History  . Marital Status: Married    Spouse Name: N/A  . Number of Children: N/A  . Years of Education: N/A   Occupational History  . Not on file.   Social History Main Topics  . Smoking status: Never Smoker   . Smokeless tobacco: Not on file  . Alcohol Use: No     Comment: occasional  . Drug Use: No  . Sexual Activity: Not on file   Other Topics Concern  . Not on file   Social History Narrative     Current outpatient  prescriptions:  .  amLODipine (NORVASC) 5 MG tablet, Take 1 tablet (5 mg total) by mouth daily., Disp: 90 tablet, Rfl: 0 .  sildenafil (VIAGRA) 100 MG tablet, Take 1 tablet (100 mg total) by mouth daily as needed for erectile dysfunction., Disp: 10 tablet, Rfl: 12 .  Testosterone (TESTOPEL) 75 MG PLLT, 75 mg by Implant route every 3 (three) months. 6 pellets at a time, Disp: , Rfl:  .  traMADol (ULTRAM) 50 MG tablet, Take 1 tablet (50 mg total) by mouth every 12 (twelve) hours as needed. Start with Tramadol 25 mg q12hr PRN x 1 week. (Patient not taking: Reported on 02/25/2015), Disp: 60 tablet, Rfl: 0  No Known Allergies   Review of Systems  Eyes: Negative for blurred vision.  Respiratory: Negative for shortness of breath.   Cardiovascular: Negative for chest pain and palpitations.  Neurological: Negative for dizziness and headaches.    Objective  Filed Vitals:   03/01/15 1151  BP: 124/82  Pulse: 75  Temp: 98.2 F (36.8 C)  TempSrc: Oral  Resp: 14  Weight: 264 lb (119.75 kg)  SpO2: 97%    Physical Exam  Constitutional: He is oriented  to person, place, and time and well-developed, well-nourished, and in no distress.  Eyes: Pupils are equal, round, and reactive to light.  Cardiovascular: Normal rate and regular rhythm.   Pulmonary/Chest: Effort normal and breath sounds normal.  Musculoskeletal: Normal range of motion. He exhibits no edema.  Neurological: He is alert and oriented to person, place, and time.  Nursing note and vitals reviewed.   Assessment & Plan  1. Essential hypertension BP stable on the lower dose of Norvasc at 2.5 mg daily. - amLODipine (NORVASC) 2.5 MG tablet; Take 1 tablet (2.5 mg total) by mouth daily.  Dispense: 90 tablet; Refill: 0   Tameisha Covell Asad A[facility] 03/01/2015 12:11 PM

## 2015-03-16 ENCOUNTER — Encounter: Payer: Self-pay | Admitting: Family Medicine

## 2015-03-16 ENCOUNTER — Ambulatory Visit (INDEPENDENT_AMBULATORY_CARE_PROVIDER_SITE_OTHER): Payer: BLUE CROSS/BLUE SHIELD | Admitting: Family Medicine

## 2015-03-16 VITALS — BP 124/76 | HR 80 | Temp 98.6°F | Resp 18 | Ht 73.0 in | Wt 265.9 lb

## 2015-03-16 DIAGNOSIS — Z0000 Encounter for general adult medical examination without abnormal findings: Secondary | ICD-10-CM | POA: Diagnosis not present

## 2015-03-16 DIAGNOSIS — Z1211 Encounter for screening for malignant neoplasm of colon: Secondary | ICD-10-CM | POA: Insufficient documentation

## 2015-03-16 NOTE — Progress Notes (Signed)
Name: Seth Meadows   MRN: [id number]    DOB: 1960/09/21   Date:03/16/2015       Progress Note  Subjective  Chief Complaint  Chief Complaint  Patient presents with  . Annual Exam    CPE    HPI  Pt. Is here for a CPE. Last colonoscopy was 02/2012, was told had internal hemorrhoids. No polyps. In usual state of Health.  Past Medical History  Diagnosis Date  . Hypertension   . Hypogonadism in male   . Earache   . Allergic rhinitis   . Scrotal pain   . Kidney function test abnormal   . Cutaneous eruption   . Hyperglycemia   . Overweight   . BPH with obstruction/lower urinary tract symptoms   . Sleep apnea   . Epididymitis, left     Past Surgical History  Procedure Laterality Date  . Wisdom tooth extraction      Family History  Problem Relation Age of Onset  . Hypertension Mother   . Diabetes Mother   . Hypertension Father   . Prostate cancer Father   . Kidney disease Neg Hx   . Bladder Cancer Neg Hx     Social History   Social History  . Marital Status: Married    Spouse Name: N/A  . Number of Children: N/A  . Years of Education: N/A   Occupational History  . Not on file.   Social History Main Topics  . Smoking status: Never Smoker   . Smokeless tobacco: Not on file  . Alcohol Use: No     Comment: occasional  . Drug Use: No  . Sexual Activity: Not on file   Other Topics Concern  . Not on file   Social History Narrative     Current outpatient prescriptions:  .  amLODipine (NORVASC) 2.5 MG tablet, Take 1 tablet (2.5 mg total) by mouth daily., Disp: 90 tablet, Rfl: 0 .  JUBLIA 10 % SOLN, APPLY TO AFFECTED AREA QHS, Disp: , Rfl: 0 .  sildenafil (VIAGRA) 100 MG tablet, Take 1 tablet (100 mg total) by mouth daily as needed for erectile dysfunction., Disp: 10 tablet, Rfl: 12 .  Testosterone (TESTOPEL) 75 MG PLLT, 75 mg by Implant route every 3 (three) months. 6 pellets at a time, Disp: , Rfl:   No Known Allergies   Review of Systems   Constitutional: Negative for fever, chills, weight loss and malaise/fatigue.  HENT: Negative for sore throat.   Eyes: Negative for blurred vision and double vision.  Respiratory: Positive for shortness of breath (occasional shortness of breath, especially with walking upstairs.). Negative for cough.   Cardiovascular: Negative for chest pain and palpitations.  Gastrointestinal: Negative for diarrhea, constipation, blood in stool and melena.  Genitourinary: Negative for dysuria and urgency.  Musculoskeletal: Positive for back pain. Negative for myalgias and joint pain.  Neurological: Negative for dizziness and headaches.  Psychiatric/Behavioral: Negative for depression. The patient is not nervous/anxious and does not have insomnia.     Objective  Filed Vitals:   03/16/15 0922  BP: 124/76  Pulse: 80  Temp: 98.6 F (37 C)  TempSrc: Oral  Resp: 18  Height: 6\' 1"  (1.854 m)  Weight: 265 lb 14.4 oz (120.611 kg)  SpO2: 97%    Physical Exam  Constitutional: He is oriented to person, place, and time and well-developed, well-nourished, and in no distress.  HENT:  Head: Normocephalic and atraumatic.  Eyes: Conjunctivae are normal. Pupils are equal, round, and reactive to  light.  Neck: Normal range of motion. Neck supple.  Cardiovascular: Normal rate and regular rhythm.   Pulmonary/Chest: Effort normal and breath sounds normal.  Abdominal: Soft. Bowel sounds are normal.  Genitourinary: Rectum normal. Rectal exam shows no external hemorrhoid, no internal hemorrhoid and no mass.  Musculoskeletal: Normal range of motion.  Neurological: He is alert and oriented to person, place, and time.  Skin: Skin is warm and dry.  Psychiatric: Mood, memory, affect and judgment normal.  Nursing note and vitals reviewed.    Assessment & Plan  1. Annual physical exam Normal exam, we will obtain age-appropriate screening laboratory workup. - Comprehensive Metabolic Panel (CMET) - Lipid Profile -  TSH - Vitamin D (25 hydroxy)   Mionna Advincula Asad A. [facility] 03/16/2015 9:33 AM

## 2015-03-17 LAB — COMPREHENSIVE METABOLIC PANEL
ALT: 29 IU/L (ref 0–44)
AST: 36 IU/L (ref 0–40)
Albumin/Globulin Ratio: 1.4 (ref 1.1–2.5)
Albumin: 3.9 g/dL (ref 3.5–5.5)
Alkaline Phosphatase: 55 IU/L (ref 39–117)
BUN/Creatinine Ratio: 10 (ref 9–20)
BUN: 12 mg/dL (ref 6–24)
Bilirubin Total: 0.5 mg/dL (ref 0.0–1.2)
CO2: 24 mmol/L (ref 18–29)
Calcium: 8.9 mg/dL (ref 8.7–10.2)
Chloride: 101 mmol/L (ref 96–106)
Creatinine, Ser: 1.17 mg/dL (ref 0.76–1.27)
GFR calc Af Amer: 80 mL/min/{1.73_m2} (ref >59)
GFR calc non Af Amer: 69 mL/min/{1.73_m2} (ref >59)
Globulin, Total: 2.8 g/dL (ref 1.5–4.5)
Glucose: 89 mg/dL (ref 65–99)
Potassium: 4.9 mmol/L (ref 3.5–5.2)
Sodium: 140 mmol/L (ref 134–144)
Total Protein: 6.7 g/dL (ref 6.0–8.5)

## 2015-03-17 LAB — LIPID PANEL
Chol/HDL Ratio: 4.2 ratio units (ref 0.0–5.0)
Cholesterol, Total: 155 mg/dL (ref 100–199)
HDL: 37 mg/dL — ABNORMAL LOW (ref >39)
LDL Calculated: 101 mg/dL — ABNORMAL HIGH (ref 0–99)
Triglycerides: 86 mg/dL (ref 0–149)
VLDL Cholesterol Cal: 17 mg/dL (ref 5–40)

## 2015-03-17 LAB — TSH: TSH: 2.53 u[IU]/mL (ref 0.450–4.500)

## 2015-03-17 LAB — VITAMIN D 25 HYDROXY (VIT D DEFICIENCY, FRACTURES): Vit D, 25-Hydroxy: 25.2 ng/mL — ABNORMAL LOW (ref 30.0–100.0)

## 2015-03-18 ENCOUNTER — Telehealth: Payer: Self-pay

## 2015-03-18 MED ORDER — VITAMIN D (ERGOCALCIFEROL) 1.25 MG (50000 UNIT) PO CAPS: 50000.0000 [IU] | ORAL_CAPSULE | ORAL | Status: DC

## 2015-03-18 NOTE — Telephone Encounter (Signed)
A prescription for vitamin D3 50,000 units has been sent to [facility], per Dr. Manuella Ghazi patient has been notified

## 2015-04-26 ENCOUNTER — Other Ambulatory Visit: Payer: BLUE CROSS/BLUE SHIELD

## 2015-04-26 DIAGNOSIS — E291 Testicular hypofunction: Secondary | ICD-10-CM | POA: Diagnosis not present

## 2015-04-27 ENCOUNTER — Telehealth: Payer: Self-pay | Admitting: Urology

## 2015-04-27 LAB — TESTOSTERONE: Testosterone: 318 ng/dL — ABNORMAL LOW (ref 348–1197)

## 2015-04-27 NOTE — Telephone Encounter (Signed)
Spoke with pt in reference to testosterone results. Made aware will need an office visit to see [facility]. Pt voiced understanding. Pt stated he will call back to make appt.

## 2015-04-27 NOTE — Telephone Encounter (Signed)
Please advise 

## 2015-04-27 NOTE — Telephone Encounter (Signed)
Mr. Seth Meadows called saying he's received his lab results stating he has low testosterone and he's wondering what the next step will be. He'd like a phone call regarding this so he'll know if he needs to take a pill, use a nose spray, or what. Please call the patient.  Pt's ph# [phone] Thank you.

## 2015-04-27 NOTE — Telephone Encounter (Signed)
-----   Message from Nori Riis, PA-C sent at 04/27/2015 10:24 AM EDT ----- Patient's testosterone is low.  He will need an office visit with me.

## 2015-04-29 ENCOUNTER — Encounter: Payer: Self-pay | Admitting: Urology

## 2015-04-29 ENCOUNTER — Ambulatory Visit (INDEPENDENT_AMBULATORY_CARE_PROVIDER_SITE_OTHER): Payer: BLUE CROSS/BLUE SHIELD | Admitting: Urology

## 2015-04-29 VITALS — BP 159/92 | HR 82 | Ht 73.0 in | Wt 261.9 lb

## 2015-04-29 DIAGNOSIS — N529 Male erectile dysfunction, unspecified: Secondary | ICD-10-CM

## 2015-04-29 DIAGNOSIS — N400 Enlarged prostate without lower urinary tract symptoms: Secondary | ICD-10-CM | POA: Diagnosis not present

## 2015-04-29 DIAGNOSIS — E291 Testicular hypofunction: Secondary | ICD-10-CM

## 2015-04-29 DIAGNOSIS — N401 Enlarged prostate with lower urinary tract symptoms: Secondary | ICD-10-CM | POA: Diagnosis not present

## 2015-04-29 DIAGNOSIS — N528 Other male erectile dysfunction: Secondary | ICD-10-CM

## 2015-04-29 DIAGNOSIS — N138 Other obstructive and reflux uropathy: Secondary | ICD-10-CM

## 2015-04-29 MED ORDER — CLOMIPHENE CITRATE 50 MG PO TABS: ORAL_TABLET | ORAL | Status: DC

## 2015-04-29 NOTE — Progress Notes (Signed)
04/29/2015 1:58 PM   Seth Meadows 1960/11/08 [id number]  Referring provider: Roselee Nova, MD [address] [place], [place] [address]  Chief Complaint  Patient presents with  . Hypogonadism    HPI: Patient is 55 year old Caucasian male with hypogonadism, erectile dysfunction and BPH with LUTS who presents today for a 6 month follow up.     Hypogonadism Patient is experiencing a decrease in libido, a lack of energy, a sadness and/or grumpiness and erections being less strong.   This is indicated by his responses to the ADAM questionnaire.  He has been on Testopel and topical gels for testosterone therapy and he has not found them effective.        Androgen Deficiency in the Aging Male      04/29/15 1300       Androgen Deficiency in the Aging Male   Do you have a decrease in libido (sex drive) Yes     Do you have lack of energy Yes     Do you have a decrease in strength and/or endurance No     Have you lost height No     Have you noticed a decreased "enjoyment of life" No     Are you sad and/or grumpy Yes     Are your erections less strong Yes     Have you noticed a recent deterioration in your ability to play sports No     Are you falling asleep after dinner No     Has there been a recent deterioration in your work performance No       Erectile dysfunction His SHIM score is 5, which is severe erectile dysfunction.   He has been having difficulty with erections for the last five years.   His major complaint is not having erections.  His libido is diminished.   His risk factors for ED are age, BPH. HTN, HLD and sleep apnea.  He denies any painful erections or curvatures with his erections.   He has tried Viagra in the past with equivocal results.        SHIM      04/29/15 1349       SHIM: Over the last 6 months:   How do you rate your confidence that you could get and keep an erection? Very Low     When you had erections with sexual stimulation, how  often were your erections hard enough for penetration (entering your partner)? Almost Never or Never     During sexual intercourse, how often were you able to maintain your erection after you had penetrated (entered) your partner? Extremely Difficult     During sexual intercourse, how difficult was it to maintain your erection to completion of intercourse? Extremely Difficult     When you attempted sexual intercourse, how often was it satisfactory for you? Extremely Difficult     SHIM Total Score   SHIM 5        Score: 1-7 Severe ED 8-11 Moderate ED 12-16 Mild-Moderate ED 17-21 Mild ED 22-25 No ED   BPH WITH LUTS His IPSS score today is 1, which is mild lower urinary tract symptomatology. He is delighted with his quality life due to his urinary symptoms.  He denies any dysuria, hematuria or suprapubic pain.   He also denies any recent fevers, chills, nausea or vomiting.  He has a family history of PCa, with his father was diagnosed with prostate cancer.  IPSS      04/29/15 1300       International Prostate Symptom Score   How often have you had the sensation of not emptying your bladder? Not at All     How often have you had to urinate less than every two hours? Not at All     How often have you found you stopped and started again several times when you urinated? Not at All     How often have you found it difficult to postpone urination? Not at All     How often have you had a weak urinary stream? Not at All     How often have you had to strain to start urination? Not at All     How many times did you typically get up at night to urinate? 1 Time     Total IPSS Score 1     Quality of Life due to urinary symptoms   If you were to spend the rest of your life with your urinary condition just the way it is now how would you feel about that? Delighted        Score:  1-7 Mild 8-19 Moderate 20-35 Severe       PMH: Past Medical History  Diagnosis Date  .  Hypertension   . Hypogonadism in male   . Earache   . Allergic rhinitis   . Scrotal pain   . Kidney function test abnormal   . Cutaneous eruption   . Hyperglycemia   . Overweight   . BPH with obstruction/lower urinary tract symptoms   . Sleep apnea   . Epididymitis, left     Surgical History: Past Surgical History  Procedure Laterality Date  . Wisdom tooth extraction      Home Medications:    Medication List       This list is accurate as of: 04/29/15  1:58 PM.  Always use your most recent med list.               amLODipine 2.5 MG tablet  Commonly known as:  NORVASC  Take 1 tablet (2.5 mg total) by mouth daily.     clomiPHENE 50 MG tablet  Commonly known as:  CLOMID  Take 1/2 tablet daily     JUBLIA 10 % Soln  Generic drug:  Efinaconazole  APPLY TO AFFECTED AREA QHS     sildenafil 100 MG tablet  Commonly known as:  VIAGRA  Take 1 tablet (100 mg total) by mouth daily as needed for erectile dysfunction.     TESTOPEL 75 MG Pllt  Generic drug:  Testosterone  75 mg by Implant route every 3 (three) months. 6 pellets at a time     Vitamin D (Ergocalciferol) 50000 units Caps capsule  Commonly known as:  DRISDOL  Take 1 capsule (50,000 Units total) by mouth once a week. For 8 weeks        Allergies: No Known Allergies  Family History: Family History  Problem Relation Age of Onset  . Hypertension Mother   . Diabetes Mother   . Hypertension Father   . Prostate cancer Father   . Kidney disease Neg Hx   . Bladder Cancer Neg Hx     Social History:  reports that he has never smoked. He does not have any smokeless tobacco history on file. He reports that he does not drink alcohol or use illicit drugs.  ROS: UROLOGY Frequent Urination?: No Hard to postpone urination?: No  Burning/pain with urination?: No Get up at night to urinate?: No Leakage of urine?: No Urine stream starts and stops?: No Trouble starting stream?: No Do you have to strain to urinate?:  No Blood in urine?: No Urinary tract infection?: No Sexually transmitted disease?: No Injury to kidneys or bladder?: No Painful intercourse?: No Weak stream?: No Erection problems?: Yes Penile pain?: No  Gastrointestinal Nausea?: No Vomiting?: No Indigestion/heartburn?: No Diarrhea?: No Constipation?: No  Constitutional Fever: No Night sweats?: No Weight loss?: No Fatigue?: No  Skin Skin rash/lesions?: No Itching?: No  Eyes Blurred vision?: No Double vision?: No  Ears/Nose/Throat Sore throat?: No Sinus problems?: No  Hematologic/Lymphatic Swollen glands?: No Easy bruising?: No  Cardiovascular Leg swelling?: No Chest pain?: No  Respiratory Cough?: No Shortness of breath?: No  Endocrine Excessive thirst?: No  Musculoskeletal Back pain?: No Joint pain?: No  Neurological Headaches?: No Dizziness?: No  Psychologic Depression?: No Anxiety?: No  Physical Exam: BP 159/92 mmHg  Pulse 82  Ht 6\' 1"  (1.854 m)  Wt 261 lb 14.4 oz (118.797 kg)  BMI 34.56 kg/m2  Constitutional: Well nourished. Alert and oriented, No acute distress. HEENT: [place] AT, moist mucus membranes. Trachea midline, no masses. Cardiovascular: No clubbing, cyanosis, or edema. Respiratory: Normal respiratory effort, no increased work of breathing. GI: Abdomen is soft, non tender, non distended, no abdominal masses. Liver and spleen not palpable.  No hernias appreciated.  Stool sample for occult testing is not indicated.   GU: No CVA tenderness.  No bladder fullness or masses.  Patient with uncircumcised phallus. Foreskin easily retracted  Urethral meatus is patent.  No penile discharge. No penile lesions or rashes. Scrotum without lesions, cysts, rashes and/or edema.  Testicles are located scrotally bilaterally. No masses are appreciated in the testicles. Left and right epididymis are normal. Rectal: Patient with  normal sphincter tone. Anus and perineum without scarring or rashes. No  rectal masses are appreciated. Prostate is approximately 45 grams, no nodules are appreciated. Seminal vesicles are normal. Skin: No rashes, bruises or suspicious lesions. Lymph: No cervical or inguinal adenopathy. Neurologic: Grossly intact, no focal deficits, moving all 4 extremities. Psychiatric: Normal mood and affect.  Laboratory Data: Lab Results  Component Value Date   WBC 6.6 08/20/2014   HCT 45.4 02/19/2015   MCV 85 08/20/2014   PLT 206 08/20/2014    Lab Results  Component Value Date   CREATININE 1.17 03/16/2015     Lab Results  Component Value Date   TESTOSTERONE 318* 04/26/2015     Lab Results  Component Value Date   TSH 2.530 03/16/2015       Component Value Date/Time   CHOL 155 03/16/2015 1028   HDL 37* 03/16/2015 1028   CHOLHDL 4.2 03/16/2015 1028   LDLCALC 101* 03/16/2015 1028    Lab Results  Component Value Date   AST 36 03/16/2015   Lab Results  Component Value Date   ALT 29 03/16/2015    Assessment & Plan:    1. Hypogonadism in male:   Patient has not had good results with Testopel or topical testosterone gels. He would like to pursue other treatment options at this time.  I  discussed that some men have had success using clomid for hypogonadism.  It does seem to be more successful in younger men, but there are incidences of good results in middle-aged men.  I explained that it is used in male infertility to stimulate the testicles to make more testosterone/sperm.  There has been  no long term data on side effects, but some urologists has been having success with this medication.   His LFT's are within normal limits, so it would be appropriated to initiate Clomid therapy.  I have sent a prescription for the Clomid to the pharmacy.  Clomid 50 mg, 1/2 tablet daily, # 30 with 3 refills.   He will RTC in one month for a morning serum testosterone level before 9 AM.  - Estradiol - Hematocrit  2. Erectile dysfunction:    SHIM score is 5.  He states  he usually does not have difficulty with erections within the initial stages of his testosterone therapy.  He would like to postpone any further evaluation for ED until his testosterone levels are therapeutic.  3. BPH (benign prostatic hyperplasia) with LUTS:   IPSS score is 1/0.  We will continue to monitor as testosterone therapy can worsen BPH symptoms.   He will have his IPSS score, exam and PSA in 6 months.  - PSA   Return in about 1 month (around 05/29/2015) for AM testosterone before 9 AM.  These notes generated with voice recognition software. I apologize for typographical errors.  Zara Council, [facility] [address], [place], [place] [address] [phone]

## 2015-04-30 ENCOUNTER — Telehealth: Payer: Self-pay

## 2015-04-30 DIAGNOSIS — N401 Enlarged prostate with lower urinary tract symptoms: Secondary | ICD-10-CM

## 2015-04-30 DIAGNOSIS — E291 Testicular hypofunction: Secondary | ICD-10-CM

## 2015-04-30 DIAGNOSIS — N138 Other obstructive and reflux uropathy: Secondary | ICD-10-CM

## 2015-04-30 LAB — ESTRADIOL: Estradiol: 26.6 pg/mL (ref 7.6–42.6)

## 2015-04-30 LAB — PSA: Prostate Specific Ag, Serum: 0.9 ng/mL (ref 0.0–4.0)

## 2015-04-30 LAB — HEMATOCRIT: Hematocrit: 45.4 % (ref 37.5–51.0)

## 2015-04-30 NOTE — Telephone Encounter (Signed)
-----   Message from Nori Riis, PA-C sent at 04/30/2015  8:24 AM EDT ----- Patient's labs are normal.  We will repeat his testosterone level in one month before 9 AM.

## 2015-04-30 NOTE — Telephone Encounter (Signed)
Spoke with pt in reference to lab results. Pt voiced understanding.  

## 2015-05-31 ENCOUNTER — Ambulatory Visit (INDEPENDENT_AMBULATORY_CARE_PROVIDER_SITE_OTHER): Payer: BLUE CROSS/BLUE SHIELD | Admitting: Family Medicine

## 2015-05-31 ENCOUNTER — Encounter: Payer: Self-pay | Admitting: Family Medicine

## 2015-05-31 VITALS — BP 122/88 | HR 84 | Temp 98.1°F | Resp 18 | Ht 73.0 in | Wt 252.0 lb

## 2015-05-31 DIAGNOSIS — I10 Essential (primary) hypertension: Secondary | ICD-10-CM | POA: Diagnosis not present

## 2015-05-31 DIAGNOSIS — E785 Hyperlipidemia, unspecified: Secondary | ICD-10-CM | POA: Diagnosis not present

## 2015-05-31 MED ORDER — AMLODIPINE BESYLATE 2.5 MG PO TABS: 2.5000 mg | ORAL_TABLET | Freq: Every day | ORAL | Status: DC

## 2015-05-31 NOTE — Progress Notes (Signed)
Name: Seth Meadows   MRN: [id number]    DOB: August 01, 1960   Date:05/31/2015       Progress Note  Subjective  Chief Complaint  Chief Complaint  Patient presents with  . Hypertension    follow up    Hypertension This is a chronic problem. The problem is unchanged. Pertinent negatives include no blurred vision, chest pain, headaches, palpitations or shortness of breath. Past treatments include calcium channel blockers.  Hyperlipidemia This is a chronic problem. Recent lipid tests were reviewed and are high. Exacerbating diseases include obesity. Pertinent negatives include no chest pain or shortness of breath. He is currently on no antihyperlipidemic treatment.     Past Medical History  Diagnosis Date  . Hypertension   . Hypogonadism in male   . Earache   . Allergic rhinitis   . Scrotal pain   . Kidney function test abnormal   . Cutaneous eruption   . Hyperglycemia   . Overweight   . BPH with obstruction/lower urinary tract symptoms   . Sleep apnea   . Epididymitis, left     Past Surgical History  Procedure Laterality Date  . Wisdom tooth extraction      Family History  Problem Relation Age of Onset  . Hypertension Mother   . Diabetes Mother   . Hypertension Father   . Prostate cancer Father   . Kidney disease Neg Hx   . Bladder Cancer Neg Hx     Social History   Social History  . Marital Status: Married    Spouse Name: N/A  . Number of Children: N/A  . Years of Education: N/A   Occupational History  . Not on file.   Social History Main Topics  . Smoking status: Never Smoker   . Smokeless tobacco: Not on file  . Alcohol Use: No     Comment: occasional  . Drug Use: No  . Sexual Activity: Not on file   Other Topics Concern  . Not on file   Social History Narrative     Current outpatient prescriptions:  .  amLODipine (NORVASC) 2.5 MG tablet, Take 1 tablet (2.5 mg total) by mouth daily., Disp: 90 tablet, Rfl: 0 .  clomiPHENE (CLOMID) 50 MG tablet,  Take 1/2 tablet daily, Disp: 30 tablet, Rfl: 3 .  JUBLIA 10 % SOLN, APPLY TO AFFECTED AREA QHS, Disp: , Rfl: 0 .  sildenafil (VIAGRA) 100 MG tablet, Take 1 tablet (100 mg total) by mouth daily as needed for erectile dysfunction., Disp: 10 tablet, Rfl: 12  No Known Allergies   Review of Systems  Eyes: Negative for blurred vision.  Respiratory: Negative for shortness of breath.   Cardiovascular: Negative for chest pain and palpitations.  Neurological: Positive for dizziness (intermittent dizziness when going upstairs.). Negative for headaches.    Objective  Filed Vitals:   05/31/15 0857  BP: 122/88  Pulse: 84  Temp: 98.1 F (36.7 C)  TempSrc: Oral  Resp: 18  Height: 6\' 1"  (1.854 m)  Weight: 252 lb (114.306 kg)  SpO2: 98%    Physical Exam  Constitutional: He is oriented to person, place, and time and well-developed, well-nourished, and in no distress.  Cardiovascular: Normal rate and regular rhythm.   Pulmonary/Chest: Effort normal and breath sounds normal.  Musculoskeletal:       Right ankle: He exhibits no swelling.       Left ankle: He exhibits no swelling.  Neurological: He is alert and oriented to person, place, and time.  Nursing  note and vitals reviewed.    Assessment & Plan  1. Essential hypertension Blood pressure is stable and controlled on present antihypertensive therapy. - amLODipine (NORVASC) 2.5 MG tablet; Take 1 tablet (2.5 mg total) by mouth daily.  Dispense: 90 tablet; Refill: 0  2. Hyperlipidemia Repeat FLP and consider starting on statin therapy to lower CVD risk. - Lipid Profile   Jamieka Royle Asad A[facility] 05/31/2015 9:01 AM

## 2015-06-01 ENCOUNTER — Other Ambulatory Visit: Payer: BLUE CROSS/BLUE SHIELD

## 2015-06-01 DIAGNOSIS — E291 Testicular hypofunction: Secondary | ICD-10-CM

## 2015-06-01 LAB — LIPID PANEL
Chol/HDL Ratio: 4.4 ratio units (ref 0.0–5.0)
Cholesterol, Total: 146 mg/dL (ref 100–199)
HDL: 33 mg/dL — ABNORMAL LOW (ref >39)
LDL Calculated: 88 mg/dL (ref 0–99)
Triglycerides: 127 mg/dL (ref 0–149)
VLDL Cholesterol Cal: 25 mg/dL (ref 5–40)

## 2015-06-02 LAB — TESTOSTERONE: Testosterone: 487 ng/dL (ref 348–1197)

## 2015-06-04 ENCOUNTER — Ambulatory Visit: Payer: BLUE CROSS/BLUE SHIELD | Admitting: Urology

## 2015-06-04 ENCOUNTER — Telehealth: Payer: Self-pay

## 2015-06-04 DIAGNOSIS — E291 Testicular hypofunction: Secondary | ICD-10-CM

## 2015-06-04 NOTE — Telephone Encounter (Signed)
Per Larene Beach pt still does not need to keep appt. Pt can take a whole tablet of clomid and check labs again in 54mo. Spoke with pt and made aware of Shannon's orders. Pt voiced understanding. Lab appt made and orders placed.

## 2015-06-04 NOTE — Telephone Encounter (Signed)
Per Larene Beach pt did not need to keep appt today. Spoke with pt and made aware he did not need to keep appt. Pt stated that he had a couple of questions and would prefer to keep appt today. Made pt aware that would be fine.

## 2015-06-11 ENCOUNTER — Other Ambulatory Visit: Payer: Self-pay | Admitting: Urology

## 2015-06-11 ENCOUNTER — Telehealth: Payer: Self-pay | Admitting: Urology

## 2015-06-11 DIAGNOSIS — E291 Testicular hypofunction: Secondary | ICD-10-CM

## 2015-06-11 MED ORDER — CLOMIPHENE CITRATE 50 MG PO TABS: ORAL_TABLET | ORAL | Status: DC

## 2015-06-11 NOTE — Telephone Encounter (Signed)
Patient called and stated that he needs a refill of clomiphene, since increasing the dosage, his prescription has run out sooner than expected.  He took his last pill today.  He uses the pharmacy at [facility] on [address].

## 2015-06-11 NOTE — Telephone Encounter (Signed)
Done

## 2015-06-28 DIAGNOSIS — L304 Erythema intertrigo: Secondary | ICD-10-CM | POA: Diagnosis not present

## 2015-06-28 DIAGNOSIS — B351 Tinea unguium: Secondary | ICD-10-CM | POA: Diagnosis not present

## 2015-06-28 DIAGNOSIS — G4733 Obstructive sleep apnea (adult) (pediatric): Secondary | ICD-10-CM | POA: Diagnosis not present

## 2015-07-06 ENCOUNTER — Other Ambulatory Visit: Payer: BLUE CROSS/BLUE SHIELD

## 2015-07-06 DIAGNOSIS — E291 Testicular hypofunction: Secondary | ICD-10-CM

## 2015-07-07 ENCOUNTER — Telehealth: Payer: Self-pay

## 2015-07-07 LAB — TESTOSTERONE: Testosterone: 573 ng/dL (ref 348–1197)

## 2015-07-07 NOTE — Telephone Encounter (Signed)
-----   Message from Nori Riis, PA-C sent at 07/07/2015  8:06 AM EDT ----- Testosterone level is good.  He will need an office visit in September.  I will need a testosterone level before 10 AM, HCT  and PSA drawn the week before

## 2015-07-07 NOTE — Telephone Encounter (Signed)
Spoke with pt and made aware of testosterone results. Pt stated that he does not feel any different taking the clomid than he does without taking the clomid even though his levels are good. Reinforced with pt testopel is synthetic testosterone and the clomid is making his body produce natural testosterone. Pt stated that he wants to do something more. Please advise.

## 2015-07-11 NOTE — Telephone Encounter (Signed)
His testosterone levels have been the highest they've been in the last 4 months. We can discuss this further at his next office visit.

## 2015-07-12 NOTE — Telephone Encounter (Signed)
Spoke with pt in reference to testosterone levels. Pt stated that he has been doing some research and found that the free and total testosterone lab draws tell more. Pt stated that he would like to have the free and total drawn instead of the total. Pt stated that when he was getting the pellets he would do well for the first 6 weeks and then he would have a drop off. Pt described his current feelings to be in the "drop off mode" constantly. Please advise.

## 2015-07-12 NOTE — Telephone Encounter (Signed)
That is fine.  I don't know if insurance covers this test and I am not sure how much it costs.

## 2015-07-12 NOTE — Telephone Encounter (Signed)
Spoke with pt in reference to lab test. Made aware insurance may not cover lab. Pt stated that he will talk with his insurance company and call back.

## 2015-08-14 ENCOUNTER — Other Ambulatory Visit: Payer: Self-pay | Admitting: Family Medicine

## 2015-08-31 ENCOUNTER — Encounter: Payer: Self-pay | Admitting: Family Medicine

## 2015-08-31 ENCOUNTER — Ambulatory Visit (INDEPENDENT_AMBULATORY_CARE_PROVIDER_SITE_OTHER): Payer: BLUE CROSS/BLUE SHIELD | Admitting: Family Medicine

## 2015-08-31 VITALS — Temp 98.8°F | Resp 17 | Ht 73.0 in | Wt 257.5 lb

## 2015-08-31 DIAGNOSIS — E785 Hyperlipidemia, unspecified: Secondary | ICD-10-CM | POA: Diagnosis not present

## 2015-08-31 DIAGNOSIS — I10 Essential (primary) hypertension: Secondary | ICD-10-CM

## 2015-08-31 DIAGNOSIS — E559 Vitamin D deficiency, unspecified: Secondary | ICD-10-CM | POA: Insufficient documentation

## 2015-08-31 DIAGNOSIS — R42 Dizziness and giddiness: Secondary | ICD-10-CM

## 2015-08-31 NOTE — Progress Notes (Signed)
Name: Seth SmackRalph Meadows   MRN: [id number]    DOB: 05/22/1960   Date:08/31/2015       Progress Note  Subjective  Chief Complaint  Chief Complaint  Patient presents with  . Follow-up    3 mo    Hypertension  This is a chronic problem. The problem is unchanged. Pertinent negatives include no blurred vision, headaches or palpitations. Chest pain: also feels tightness in his chest occasionally when going up stairs.  Past treatments include calcium channel blockers. There is no history of kidney disease, CAD/MI or CVA.  Dizziness  This is a recurrent problem. Episode frequency: happens every time he goes up a long flight of stairs, feels dizzy for a few seconds (10-15), then feels fine. The problem has been unchanged. Pertinent negatives include no headaches or visual change. Chest pain: also feels tightness in his chest occasionally when going up stairs.     Past Medical History:  Diagnosis Date  . Allergic rhinitis   . BPH with obstruction/lower urinary tract symptoms   . Cutaneous eruption   . Earache   . Epididymitis, left   . Hyperglycemia   . Hypertension   . Hypogonadism in male   . Kidney function test abnormal   . Overweight   . Scrotal pain   . Sleep apnea     Past Surgical History:  Procedure Laterality Date  . WISDOM TOOTH EXTRACTION      Family History  Problem Relation Age of Onset  . Hypertension Mother   . Diabetes Mother   . Hypertension Father   . Prostate cancer Father   . Kidney disease Neg Hx   . Bladder Cancer Neg Hx     Social History   Social History  . Marital status: Married    Spouse name: N/A  . Number of children: N/A  . Years of education: N/A   Occupational History  . Not on file.   Social History Main Topics  . Smoking status: Never Smoker  . Smokeless tobacco: Never Used  . Alcohol use No     Comment: occasional  . Drug use: No  . Sexual activity: Yes   Other Topics Concern  . Not on file   Social History Narrative  . No  narrative on file     Current Outpatient Prescriptions:  .  amLODipine (NORVASC) 2.5 MG tablet, TAKE ONE TABLET BY MOUTH ONCE DAILY, Disp: 90 tablet, Rfl: 0 .  clomiPHENE (CLOMID) 50 MG tablet, Take 1 tablet daily, Disp: 30 tablet, Rfl: 3 .  JUBLIA 10 % SOLN, APPLY TO AFFECTED AREA QHS, Disp: , Rfl: 0 .  sildenafil (VIAGRA) 100 MG tablet, Take 1 tablet (100 mg total) by mouth daily as needed for erectile dysfunction., Disp: 10 tablet, Rfl: 12  No Known Allergies   Review of Systems  Eyes: Negative for blurred vision.  Cardiovascular: Negative for palpitations. Chest pain: also feels tightness in his chest occasionally when going up stairs.   Neurological: Positive for dizziness. Negative for headaches.    Objective  Vitals:   08/31/15 0851  BP: 122/80  Pulse: 73  Resp: 17  Temp: 98.8 F (37.1 C)  TempSrc: Oral  SpO2: 97%  Weight: 257 lb 8 oz (116.8 kg)  Height: 6\' 1"  (1.854 m)    Physical Exam  Constitutional: He is oriented to person, place, and time and well-developed, well-nourished, and in no distress.  HENT:  Head: Normocephalic and atraumatic.  Cardiovascular: Normal rate, regular rhythm and normal heart  sounds.   No murmur heard. Pulmonary/Chest: Effort normal and breath sounds normal. He has no wheezes.  Abdominal: Soft. Bowel sounds are normal. There is no tenderness.  Musculoskeletal: He exhibits no edema.  Neurological: He is alert and oriented to person, place, and time.  Psychiatric: Mood, memory, affect and judgment normal.  Nursing note and vitals reviewed.     Assessment & Plan  1. Essential hypertension Blood pressure is stable.  2. Dyslipidemia Repeat FLP, calculate Framingham score and consider statin therapy - Lipid Profile  3. Dizziness, nonspecific Orthostatic vital signs are normal, advised to DC amlodipine for one week to see if his symptoms resolve.  4. Vitamin D insufficiency  - Vitamin D (25 hydroxy)   Seth Meadows Seth A.  [facility] 08/31/2015 9:13 AM

## 2015-09-28 DIAGNOSIS — G4733 Obstructive sleep apnea (adult) (pediatric): Secondary | ICD-10-CM | POA: Diagnosis not present

## 2015-10-07 ENCOUNTER — Telehealth: Payer: Self-pay | Admitting: Urology

## 2015-10-07 NOTE — Telephone Encounter (Signed)
LMOM to call office back. ? Medication concern?

## 2015-11-29 ENCOUNTER — Other Ambulatory Visit: Payer: Self-pay | Admitting: Urology

## 2015-11-29 DIAGNOSIS — N529 Male erectile dysfunction, unspecified: Secondary | ICD-10-CM

## 2015-12-01 ENCOUNTER — Ambulatory Visit: Payer: BLUE CROSS/BLUE SHIELD | Admitting: Family Medicine

## 2015-12-30 DIAGNOSIS — G4733 Obstructive sleep apnea (adult) (pediatric): Secondary | ICD-10-CM | POA: Diagnosis not present

## 2016-02-02 DIAGNOSIS — E349 Endocrine disorder, unspecified: Secondary | ICD-10-CM | POA: Diagnosis not present

## 2016-02-02 DIAGNOSIS — Z8669 Personal history of other diseases of the nervous system and sense organs: Secondary | ICD-10-CM | POA: Diagnosis not present

## 2016-02-15 DIAGNOSIS — Z23 Encounter for immunization: Secondary | ICD-10-CM | POA: Diagnosis not present

## 2016-02-28 DIAGNOSIS — K219 Gastro-esophageal reflux disease without esophagitis: Secondary | ICD-10-CM | POA: Diagnosis not present

## 2016-02-28 DIAGNOSIS — Z9989 Dependence on other enabling machines and devices: Secondary | ICD-10-CM

## 2016-02-28 DIAGNOSIS — G4733 Obstructive sleep apnea (adult) (pediatric): Secondary | ICD-10-CM | POA: Diagnosis not present

## 2016-03-01 DIAGNOSIS — K219 Gastro-esophageal reflux disease without esophagitis: Secondary | ICD-10-CM | POA: Insufficient documentation

## 2016-03-23 ENCOUNTER — Telehealth: Payer: Self-pay | Admitting: Family Medicine

## 2016-03-23 NOTE — Telephone Encounter (Signed)
PT CALLED AND ASKED TO GET AN APPT AND YOU DID NOT HAVE ANYTHING. [place] AND [place] AND HE WILL BE GONE SEVERAL WEEKS. HE IS NEEDING HIS BLOOD PRESSURE MEDICATION REFILLED. HE IS MAKING A APPT IN HOPES THAT HE WILL BE HOME A FEW WEEKS FROM NOW. WOULD YOU BE WILLING TO REFILL HIS BP MEDICATION TILL HE CAN GET IN HERE FOR HIS APPT? PHARM IS [facility] ON [address]

## 2016-03-24 ENCOUNTER — Encounter: Payer: Self-pay | Admitting: Family Medicine

## 2016-03-24 ENCOUNTER — Ambulatory Visit (INDEPENDENT_AMBULATORY_CARE_PROVIDER_SITE_OTHER): Payer: BLUE CROSS/BLUE SHIELD | Admitting: Family Medicine

## 2016-03-24 VITALS — BP 130/79 | HR 100 | Temp 98.6°F | Resp 18 | Ht 73.0 in | Wt 268.5 lb

## 2016-03-24 DIAGNOSIS — R1084 Generalized abdominal pain: Secondary | ICD-10-CM | POA: Diagnosis not present

## 2016-03-24 DIAGNOSIS — I10 Essential (primary) hypertension: Secondary | ICD-10-CM

## 2016-03-24 LAB — CBC WITH DIFFERENTIAL/PLATELET
Basophils Absolute: 69 cells/uL (ref 0–200)
Basophils Relative: 1 %
Eosinophils Absolute: 138 cells/uL (ref 15–500)
Eosinophils Relative: 2 %
HCT: 43.2 % (ref 38.5–50.0)
Hemoglobin: 14.5 g/dL (ref 13.2–17.1)
Lymphocytes Relative: 34 %
Lymphs Abs: 2346 cells/uL (ref 850–3900)
MCH: 27.9 pg (ref 27.0–33.0)
MCHC: 33.6 g/dL (ref 32.0–36.0)
MCV: 83.1 fL (ref 80.0–100.0)
MPV: 10.1 fL (ref 7.5–12.5)
Monocytes Absolute: 690 cells/uL (ref 200–950)
Monocytes Relative: 10 %
Neutro Abs: 3657 cells/uL (ref 1500–7800)
Neutrophils Relative %: 53 %
Platelets: 215 10*3/uL (ref 140–400)
RBC: 5.2 MIL/uL (ref 4.20–5.80)
RDW: 14.1 % (ref 11.0–15.0)
WBC: 6.9 10*3/uL (ref 3.8–10.8)

## 2016-03-24 LAB — COMPLETE METABOLIC PANEL WITH GFR
ALT: 19 U/L (ref 9–46)
AST: 22 U/L (ref 10–35)
Albumin: 3.8 g/dL (ref 3.6–5.1)
Alkaline Phosphatase: 58 U/L (ref 40–115)
BUN: 14 mg/dL (ref 7–25)
CO2: 26 mmol/L (ref 20–31)
Calcium: 9 mg/dL (ref 8.6–10.3)
Chloride: 105 mmol/L (ref 98–110)
Creat: 1.19 mg/dL (ref 0.70–1.33)
GFR, Est African American: 78 mL/min (ref >=60)
GFR, Est Non African American: 67 mL/min (ref >=60)
Glucose, Bld: 89 mg/dL (ref 65–99)
Potassium: 4.2 mmol/L (ref 3.5–5.3)
Sodium: 139 mmol/L (ref 135–146)
Total Bilirubin: 0.4 mg/dL (ref 0.2–1.2)
Total Protein: 6.7 g/dL (ref 6.1–8.1)

## 2016-03-24 MED ORDER — AMLODIPINE BESYLATE 5 MG PO TABS: 5.0000 mg | ORAL_TABLET | Freq: Every day | ORAL | 0 refills | Status: DC

## 2016-03-24 NOTE — Telephone Encounter (Signed)
Patient was seen today and his blood pressure medication was refilled.

## 2016-03-24 NOTE — Progress Notes (Signed)
Name: Seth Meadows   MRN: [id number]    DOB: [birth year]   Date:03/24/2016       Progress Note  Subjective  Chief Complaint  Chief Complaint  Patient presents with  . Hypertension    Has not been taken meds as directed. Taking Amlodipine what he had left from previous script.  . Abdominal Pain    Hypertension  This is a chronic problem. The problem has been gradually worsening (stopped taking Amlodipine 2 weeks ago) since onset. The problem is controlled. Pertinent negatives include no blurred vision, chest pain, headaches, palpitations or shortness of breath. Past treatments include calcium channel blockers. There is no history of kidney disease, CAD/MI or CVA.  Abdominal Pain  This is a new problem. The pain is located in the generalized abdominal region (described as a 'discomfort' rather than pain, more when he pushes in with his hand, also feels bloated, feels like he is passing more gas,  belching is about the same). The pain is at a severity of 4/10. The quality of the pain is a sensation of fullness. The abdominal pain does not radiate. Associated symptoms include belching and flatus. Pertinent negatives include no constipation, diarrhea, fever, headaches, hematochezia, hematuria, melena, nausea or vomiting. He has tried H2 blockers for the symptoms. The treatment provided moderate relief. There is no history of Crohn's disease, GERD or irritable bowel syndrome.    Past Medical History:  Diagnosis Date  . Allergic rhinitis   . BPH with obstruction/lower urinary tract symptoms   . Cutaneous eruption   . Earache   . Epididymitis, left   . Hyperglycemia   . Hypertension   . Hypogonadism in male   . Kidney function test abnormal   . Overweight   . Scrotal pain   . Sleep apnea     Past Surgical History:  Procedure Laterality Date  . WISDOM TOOTH EXTRACTION      Family History  Problem Relation Age of Onset  . Hypertension Mother   . Diabetes Mother   . Hypertension Father    . Prostate cancer Father   . Kidney disease Neg Hx   . Bladder Cancer Neg Hx     Social History   Social History  . Marital status: Married    Spouse name: N/A  . Number of children: N/A  . Years of education: N/A   Occupational History  . Not on file.   Social History Main Topics  . Smoking status: Never Smoker  . Smokeless tobacco: Never Used  . Alcohol use No     Comment: occasional  . Drug use: No  . Sexual activity: Yes   Other Topics Concern  . Not on file   Social History Narrative  . No narrative on file     Current Outpatient Prescriptions:  .  amLODipine (NORVASC) 2.5 MG tablet, TAKE ONE TABLET BY MOUTH ONCE DAILY, Disp: 90 tablet, Rfl: 0 .  clomiPHENE (CLOMID) 50 MG tablet, Take 1 tablet daily (Patient not taking: Reported on 03/24/2016), Disp: 30 tablet, Rfl: 3 .  JUBLIA 10 % SOLN, APPLY TO AFFECTED AREA QHS, Disp: , Rfl: 0 .  sildenafil (VIAGRA) 100 MG tablet, Take 1 tablet (100 mg total) by mouth daily as needed for erectile dysfunction. (Patient not taking: Reported on 03/24/2016), Disp: 10 tablet, Rfl: 12 .  testosterone cypionate (DEPOTESTOSTERONE CYPIONATE) 200 MG/ML injection, , Disp: , Rfl: 0  No Known Allergies   Review of Systems  Constitutional: Negative for fever.  Eyes:  Negative for blurred vision.  Respiratory: Negative for shortness of breath.   Cardiovascular: Negative for chest pain and palpitations.  Gastrointestinal: Positive for abdominal pain and flatus. Negative for constipation, diarrhea, hematochezia, melena, nausea and vomiting.  Genitourinary: Negative for hematuria.  Neurological: Negative for headaches.     Objective  Vitals:   03/24/16 1448  BP: 130/79  Pulse: 100  Resp: 18  Temp: 98.6 F (37 C)  TempSrc: Oral  SpO2: 96%  Weight: 268 lb 8 oz (121.8 kg)  Height: 6\' 1"  (1.854 m)    Physical Exam  Constitutional: He is oriented to person, place, and time and well-developed, well-nourished, and in no distress.   HENT:  Head: Normocephalic and atraumatic.  Mouth/Throat: Oropharynx is clear and moist. No posterior oropharyngeal erythema.  Cardiovascular: Normal rate, regular rhythm and normal heart sounds.   No murmur heard. Pulmonary/Chest: Effort normal and breath sounds normal. He has no wheezes.  Abdominal: Soft. Bowel sounds are normal. There is tenderness in the right lower quadrant, suprapubic area and left lower quadrant. There is no rebound.  Neurological: He is alert and oriented to person, place, and time.  Psychiatric: Mood, memory, affect and judgment normal.  Nursing note and vitals reviewed.     Assessment & Plan  1. Essential hypertension Advised to restart amlodipine at 5 mg daily, refills provided and follow-up in 3 months - amLODipine (NORVASC) 5 MG tablet; Take 1 tablet (5 mg total) by mouth daily.  Dispense: 90 tablet; Refill: 0  2. Generalized abdominal pain Unclear etiology, recurrent abdominal pain. Obtain CT of abdomen and pelvis for evaluation - CBC with Differential/Platelet - COMPLETE METABOLIC PANEL WITH GFR - CT Abdomen Pelvis Wo Contrast; Future   Lauryl Seyer Asad A. [facility] 03/24/2016 3:01 PM

## 2016-03-27 ENCOUNTER — Telehealth: Payer: Self-pay

## 2016-03-27 NOTE — Telephone Encounter (Signed)
I contacted this patient to inform him that he has been scheduled to have his CT on 03/31/16 @ 10am at the [facility], but there was no answer. A message was left to let him know this and that he is to pick up a prep kit prior to his appt and to arrive 52mns. Also it was mentioned that he is not to eat anything 4 hrs prior to his appt.

## 2016-03-27 NOTE — Addendum Note (Signed)
Addended by: Saunders Glance A on: 03/27/2016 12:35 PM   Modules accepted: Orders

## 2016-03-30 DIAGNOSIS — G4733 Obstructive sleep apnea (adult) (pediatric): Secondary | ICD-10-CM | POA: Diagnosis not present

## 2016-03-31 ENCOUNTER — Ambulatory Visit: Admission: RE | Admit: 2016-03-31 | Payer: BLUE CROSS/BLUE SHIELD | Source: Ambulatory Visit

## 2016-04-18 ENCOUNTER — Telehealth: Payer: Self-pay | Admitting: Family Medicine

## 2016-04-18 NOTE — Telephone Encounter (Signed)
Pt would like a call back about some test that need to be done before he can get a CT scan.

## 2016-04-19 NOTE — Telephone Encounter (Signed)
Insurance will not pay for CT Scan due to cost. IS there another xray or scan at less cost that be ordered.

## 2016-04-21 NOTE — Telephone Encounter (Signed)
Based on patient's symptoms and presentation of abdominal pain, CT scan of abdomen and pelvis is the best initial test that is likely to give [place] the most information to rule in and rule out different possible etiologies.

## 2016-04-28 NOTE — Telephone Encounter (Signed)
Is there another facility he may need to go to

## 2016-05-17 DIAGNOSIS — E349 Endocrine disorder, unspecified: Secondary | ICD-10-CM | POA: Diagnosis not present

## 2016-05-17 DIAGNOSIS — N5203 Combined arterial insufficiency and corporo-venous occlusive erectile dysfunction: Secondary | ICD-10-CM | POA: Diagnosis not present

## 2016-05-25 ENCOUNTER — Encounter: Payer: BLUE CROSS/BLUE SHIELD | Admitting: Family Medicine

## 2016-05-29 DIAGNOSIS — X32XXXD Exposure to sunlight, subsequent encounter: Secondary | ICD-10-CM | POA: Diagnosis not present

## 2016-05-29 DIAGNOSIS — Z1283 Encounter for screening for malignant neoplasm of skin: Secondary | ICD-10-CM | POA: Diagnosis not present

## 2016-05-29 DIAGNOSIS — L918 Other hypertrophic disorders of the skin: Secondary | ICD-10-CM | POA: Diagnosis not present

## 2016-05-29 DIAGNOSIS — L570 Actinic keratosis: Secondary | ICD-10-CM | POA: Diagnosis not present

## 2016-05-29 DIAGNOSIS — B351 Tinea unguium: Secondary | ICD-10-CM | POA: Diagnosis not present

## 2016-06-13 DIAGNOSIS — B351 Tinea unguium: Secondary | ICD-10-CM | POA: Diagnosis not present

## 2016-06-16 ENCOUNTER — Telehealth: Payer: Self-pay | Admitting: Family Medicine

## 2016-06-16 ENCOUNTER — Other Ambulatory Visit: Payer: Self-pay | Admitting: Emergency Medicine

## 2016-06-16 DIAGNOSIS — I10 Essential (primary) hypertension: Secondary | ICD-10-CM

## 2016-06-16 MED ORDER — AMLODIPINE BESYLATE 5 MG PO TABS: 5.0000 mg | ORAL_TABLET | Freq: Every day | ORAL | 0 refills | Status: DC

## 2016-06-16 NOTE — Telephone Encounter (Signed)
Script sent  

## 2016-06-16 NOTE — Telephone Encounter (Signed)
Pt is needing refill on BP medication. Pharm is [facility] on [address]. He has an appt on Tuesday with you. He is out of them.

## 2016-06-20 ENCOUNTER — Ambulatory Visit (INDEPENDENT_AMBULATORY_CARE_PROVIDER_SITE_OTHER): Payer: BLUE CROSS/BLUE SHIELD | Admitting: Family Medicine

## 2016-06-20 ENCOUNTER — Encounter: Payer: Self-pay | Admitting: Family Medicine

## 2016-06-20 VITALS — BP 136/84 | HR 92 | Temp 98.1°F | Resp 16 | Ht 73.0 in | Wt 275.2 lb

## 2016-06-20 DIAGNOSIS — E785 Hyperlipidemia, unspecified: Secondary | ICD-10-CM

## 2016-06-20 DIAGNOSIS — I10 Essential (primary) hypertension: Secondary | ICD-10-CM | POA: Diagnosis not present

## 2016-06-20 MED ORDER — AMLODIPINE BESYLATE 5 MG PO TABS: 5.0000 mg | ORAL_TABLET | Freq: Every day | ORAL | 0 refills | Status: DC

## 2016-06-20 NOTE — Progress Notes (Signed)
Name: Seth Meadows   MRN: [id number]    DOB: [birth year]   Date:06/20/2016       Progress Note  Subjective  Chief Complaint  Chief Complaint  Patient presents with  . Medication Refill  . Hypertension    Denies any symptoms    Hypertension  This is a chronic problem. The problem is unchanged. The problem is controlled. Pertinent negatives include no blurred vision, chest pain or headaches. Past treatments include calcium channel blockers. There is no history of kidney disease, CAD/MI or CVA.  Hyperlipidemia  This is a chronic problem. The problem is controlled. Recent lipid tests were reviewed and are normal (HDL below normal). Pertinent negatives include no chest pain. Current antihyperlipidemic treatment includes diet change.     Past Medical History:  Diagnosis Date  . Allergic rhinitis   . BPH with obstruction/lower urinary tract symptoms   . Cutaneous eruption   . Earache   . Epididymitis, left   . Hyperglycemia   . Hypertension   . Hypogonadism in male   . Kidney function test abnormal   . Overweight   . Scrotal pain   . Sleep apnea     Past Surgical History:  Procedure Laterality Date  . WISDOM TOOTH EXTRACTION      Family History  Problem Relation Age of Onset  . Hypertension Mother   . Diabetes Mother   . Hypertension Father   . Prostate cancer Father   . Kidney disease Neg Hx   . Bladder Cancer Neg Hx     Social History   Social History  . Marital status: Married    Spouse name: N/A  . Number of children: N/A  . Years of education: N/A   Occupational History  . Not on file.   Social History Main Topics  . Smoking status: Never Smoker  . Smokeless tobacco: Never Used  . Alcohol use No     Comment: occasional  . Drug use: No  . Sexual activity: Yes   Other Topics Concern  . Not on file   Social History Narrative  . No narrative on file     Current Outpatient Prescriptions:  .  amLODipine (NORVASC) 5 MG tablet, Take 1 tablet (5 mg  total) by mouth daily., Disp: 90 tablet, Rfl: 0 .  sildenafil (VIAGRA) 100 MG tablet, Take 1 tablet (100 mg total) by mouth daily as needed for erectile dysfunction., Disp: 10 tablet, Rfl: 12 .  testosterone cypionate (DEPOTESTOSTERONE CYPIONATE) 200 MG/ML injection, , Disp: , Rfl: 0  No Known Allergies   Review of Systems  Eyes: Negative for blurred vision.  Cardiovascular: Negative for chest pain.  Neurological: Negative for headaches.      Objective  Vitals:   06/20/16 1456  BP: 136/84  Pulse: 92  Resp: 16  Temp: 98.1 F (36.7 C)  TempSrc: Oral  SpO2: 96%  Weight: 275 lb 3.2 oz (124.8 kg)  Height: 6\' 1"  (1.854 m)    Physical Exam  Constitutional: He is oriented to person, place, and time and well-developed, well-nourished, and in no distress.  HENT:  Head: Normocephalic and atraumatic.  Cardiovascular: Normal rate, regular rhythm and normal heart sounds.   No murmur heard. Pulmonary/Chest: Effort normal and breath sounds normal. He has no wheezes.  Abdominal: Soft. Bowel sounds are normal. There is no tenderness.  Musculoskeletal: He exhibits no edema.  Neurological: He is alert and oriented to person, place, and time.  Psychiatric: Mood, memory, affect and judgment normal.  Nursing note and vitals reviewed.      Assessment & Plan  1. Essential hypertension BP stable on present anti- hypertensive therapy - amLODipine (NORVASC) 5 MG tablet; Take 1 tablet (5 mg total) by mouth daily.  Dispense: 90 tablet; Refill: 0 - Lipid panel   2. Dyslipidemia HDL is below normal on fasting lipid profile in May 2017, recheck today and follow-up  - Lipid panel  Seth Meadows Seth A. Seth Meadows 06/20/2016 3:15 PM

## 2016-06-30 DIAGNOSIS — G4733 Obstructive sleep apnea (adult) (pediatric): Secondary | ICD-10-CM | POA: Diagnosis not present

## 2016-07-05 DIAGNOSIS — F4320 Adjustment disorder, unspecified: Secondary | ICD-10-CM | POA: Diagnosis not present

## 2016-07-10 ENCOUNTER — Other Ambulatory Visit: Payer: Self-pay | Admitting: Family Medicine

## 2016-07-10 DIAGNOSIS — I10 Essential (primary) hypertension: Secondary | ICD-10-CM | POA: Diagnosis not present

## 2016-07-10 LAB — LIPID PANEL
Cholesterol: 146 mg/dL (ref <200)
HDL: 29 mg/dL — ABNORMAL LOW (ref >40)
LDL Cholesterol: 105 mg/dL — ABNORMAL HIGH (ref <100)
Total CHOL/HDL Ratio: 5 Ratio — ABNORMAL HIGH (ref <5.0)
Triglycerides: 61 mg/dL (ref <150)
VLDL: 12 mg/dL (ref <30)

## 2016-07-12 ENCOUNTER — Encounter: Payer: Self-pay | Admitting: Family Medicine

## 2016-07-12 ENCOUNTER — Ambulatory Visit (INDEPENDENT_AMBULATORY_CARE_PROVIDER_SITE_OTHER): Payer: BLUE CROSS/BLUE SHIELD | Admitting: Family Medicine

## 2016-07-12 VITALS — BP 133/76 | HR 97 | Temp 97.7°F | Resp 16 | Ht 73.0 in | Wt 267.7 lb

## 2016-07-12 DIAGNOSIS — Z1159 Encounter for screening for other viral diseases: Secondary | ICD-10-CM

## 2016-07-12 DIAGNOSIS — E785 Hyperlipidemia, unspecified: Secondary | ICD-10-CM

## 2016-07-12 DIAGNOSIS — Z0000 Encounter for general adult medical examination without abnormal findings: Secondary | ICD-10-CM | POA: Diagnosis not present

## 2016-07-12 DIAGNOSIS — D171 Benign lipomatous neoplasm of skin and subcutaneous tissue of trunk: Secondary | ICD-10-CM | POA: Diagnosis not present

## 2016-07-12 LAB — CBC WITH DIFFERENTIAL/PLATELET
Basophils Absolute: 63 cells/uL (ref 0–200)
Basophils Relative: 1 %
Eosinophils Absolute: 189 cells/uL (ref 15–500)
Eosinophils Relative: 3 %
HCT: 45.6 % (ref 38.5–50.0)
Hemoglobin: 15.3 g/dL (ref 13.2–17.1)
Lymphocytes Relative: 28 %
Lymphs Abs: 1764 cells/uL (ref 850–3900)
MCH: 27.3 pg (ref 27.0–33.0)
MCHC: 33.6 g/dL (ref 32.0–36.0)
MCV: 81.4 fL (ref 80.0–100.0)
MPV: 10.2 fL (ref 7.5–12.5)
Monocytes Absolute: 693 cells/uL (ref 200–950)
Monocytes Relative: 11 %
Neutro Abs: 3591 cells/uL (ref 1500–7800)
Neutrophils Relative %: 57 %
Platelets: 232 10*3/uL (ref 140–400)
RBC: 5.6 MIL/uL (ref 4.20–5.80)
RDW: 14.7 % (ref 11.0–15.0)
WBC: 6.3 10*3/uL (ref 3.8–10.8)

## 2016-07-12 LAB — TSH: TSH: 1.98 mIU/L (ref 0.40–4.50)

## 2016-07-12 LAB — HEPATITIS C ANTIBODY: HCV Ab: NEGATIVE

## 2016-07-12 MED ORDER — ATORVASTATIN CALCIUM 10 MG PO TABS: 10.0000 mg | ORAL_TABLET | Freq: Every day | ORAL | 0 refills | Status: DC

## 2016-07-12 NOTE — Progress Notes (Signed)
Name: Seth Meadows   MRN: [id number]    DOB: 1960/06/10   Date:07/12/2016       Progress Note  Subjective  Chief Complaint  Chief Complaint  Patient presents with  . Annual Exam    CPE    HPI  Patient presents for Complete Physical Exam. His last colonoscopy was in 2014. He is due for Hepatitis C screening.  He follows up with Urology at [facility].   Past Medical History:  Diagnosis Date  . Allergic rhinitis   . BPH with obstruction/lower urinary tract symptoms   . Cutaneous eruption   . Earache   . Epididymitis, left   . Hyperglycemia   . Hypertension   . Hypogonadism in male   . Kidney function test abnormal   . Overweight   . Scrotal pain   . Sleep apnea     Past Surgical History:  Procedure Laterality Date  . WISDOM TOOTH EXTRACTION      Family History  Problem Relation Age of Onset  . Hypertension Mother   . Diabetes Mother   . Hypertension Father   . Prostate cancer Father   . Kidney disease Neg Hx   . Bladder Cancer Neg Hx     Social History   Social History  . Marital status: Married    Spouse name: N/A  . Number of children: N/A  . Years of education: N/A   Occupational History  . Not on file.   Social History Main Topics  . Smoking status: Never Smoker  . Smokeless tobacco: Never Used  . Alcohol use No     Comment: occasional  . Drug use: No  . Sexual activity: Yes   Other Topics Concern  . Not on file   Social History Narrative  . No narrative on file     Current Outpatient Prescriptions:  .  amLODipine (NORVASC) 5 MG tablet, Take 1 tablet (5 mg total) by mouth daily., Disp: 90 tablet, Rfl: 0 .  sildenafil (VIAGRA) 100 MG tablet, Take 1 tablet (100 mg total) by mouth daily as needed for erectile dysfunction., Disp: 10 tablet, Rfl: 12 .  terbinafine (LAMISIL) 250 MG tablet, Take 250 mg by mouth daily., Disp: , Rfl:  .  testosterone cypionate (DEPOTESTOSTERONE CYPIONATE) 200 MG/ML injection, , Disp: , Rfl: 0 .  atorvastatin  (LIPITOR) 10 MG tablet, Take 1 tablet (10 mg total) by mouth daily at 6 PM., Disp: 90 tablet, Rfl: 0  No Known Allergies   Review of Systems  Constitutional: Negative for chills, fever and malaise/fatigue.  HENT: Negative for congestion, ear pain, sinus pain and sore throat.   Eyes: Negative for blurred vision and double vision.  Respiratory: Negative for cough, sputum production and shortness of breath.   Cardiovascular: Negative for chest pain and leg swelling.  Gastrointestinal: Negative for blood in stool, constipation, diarrhea, nausea and vomiting.  Genitourinary: Negative for dysuria, hematuria and urgency.  Musculoskeletal: Positive for back pain (chronic low back pain.). Negative for neck pain.  Neurological: Negative for dizziness and headaches.  Psychiatric/Behavioral: Negative for depression. The patient is not nervous/anxious and does not have insomnia.       Objective  Vitals:   07/12/16 1104  BP: 133/76  Pulse: 97  Resp: 16  Temp: 97.7 F (36.5 C)  TempSrc: Oral  SpO2: 96%  Weight: 267 lb 11.2 oz (121.4 kg)  Height: 6\' 1"  (1.854 m)    Physical Exam  Constitutional: He is oriented to person, place, and  time and well-developed, well-nourished, and in no distress.  HENT:  Head: Normocephalic and atraumatic.  Eyes: Conjunctivae are normal. Pupils are equal, round, and reactive to light.  Neck: Normal range of motion. Neck supple.  Cardiovascular: Normal rate, regular rhythm and normal heart sounds.   No murmur heard. Pulmonary/Chest: Effort normal and breath sounds normal. He has no wheezes.  Abdominal: Soft. Bowel sounds are normal.  Genitourinary: Rectum normal. Rectal exam shows no external hemorrhoid, no internal hemorrhoid and no mass.  Musculoskeletal: Normal range of motion. He exhibits no edema.       Back:  2 cystic masses in the back, larger one measuring 4 cm in length and 2.5 centimeter in width. A smaller mass inferior and lateral to the larger  mass measuring 2 cm in length  Neurological: He is alert and oriented to person, place, and time.  Skin: Skin is warm and dry.  Psychiatric: Mood, memory, affect and judgment normal.  Nursing note and vitals reviewed.    Assessment & Plan  1. Annual physical exam Obtain age-appropriate laboratory screening - CBC with Differential/Platelet - TSH - VITAMIN D 25 Hydroxy (Vit-D Deficiency, Fractures)  2. Need for hepatitis C screening test  - Hepatitis C antibody  3. Dyslipidemia Based on 10 year risk of CVD at 10%, we'll start on low-dose statin - atorvastatin (LIPITOR) 10 MG tablet; Take 1 tablet (10 mg total) by mouth daily at 6 PM.  Dispense: 90 tablet; Refill: 0  4. Lipoma of back Marked and measured, advised to check and call back if l the mass seems to be changing and/or growing.   Rishabh Rinkenberger Asad A. [facility] 07/12/2016 5:26 PM

## 2016-07-13 LAB — VITAMIN D 25 HYDROXY (VIT D DEFICIENCY, FRACTURES): Vit D, 25-Hydroxy: 31 ng/mL (ref 30–100)

## 2016-07-24 DIAGNOSIS — B351 Tinea unguium: Secondary | ICD-10-CM | POA: Diagnosis not present

## 2016-08-14 ENCOUNTER — Encounter: Payer: Self-pay | Admitting: Family Medicine

## 2016-10-03 DIAGNOSIS — G4733 Obstructive sleep apnea (adult) (pediatric): Secondary | ICD-10-CM | POA: Diagnosis not present

## 2016-10-09 ENCOUNTER — Ambulatory Visit: Payer: BLUE CROSS/BLUE SHIELD | Admitting: Family Medicine

## 2016-10-12 ENCOUNTER — Ambulatory Visit: Payer: BLUE CROSS/BLUE SHIELD | Admitting: Family Medicine

## 2016-10-30 ENCOUNTER — Ambulatory Visit: Payer: BLUE CROSS/BLUE SHIELD | Admitting: Family Medicine

## 2016-11-20 DIAGNOSIS — E349 Endocrine disorder, unspecified: Secondary | ICD-10-CM | POA: Diagnosis not present

## 2016-12-06 ENCOUNTER — Ambulatory Visit (INDEPENDENT_AMBULATORY_CARE_PROVIDER_SITE_OTHER): Payer: BLUE CROSS/BLUE SHIELD | Admitting: Family Medicine

## 2016-12-06 ENCOUNTER — Encounter: Payer: Self-pay | Admitting: Family Medicine

## 2016-12-06 VITALS — BP 138/86 | HR 80 | Temp 97.8°F | Resp 14 | Wt 263.8 lb

## 2016-12-06 DIAGNOSIS — Z23 Encounter for immunization: Secondary | ICD-10-CM

## 2016-12-06 DIAGNOSIS — R7989 Other specified abnormal findings of blood chemistry: Secondary | ICD-10-CM | POA: Diagnosis not present

## 2016-12-06 DIAGNOSIS — I10 Essential (primary) hypertension: Secondary | ICD-10-CM

## 2016-12-06 DIAGNOSIS — E785 Hyperlipidemia, unspecified: Secondary | ICD-10-CM | POA: Diagnosis not present

## 2016-12-06 MED ORDER — AMLODIPINE BESYLATE 10 MG PO TABS: 10.0000 mg | ORAL_TABLET | Freq: Every day | ORAL | 0 refills | Status: DC

## 2016-12-06 NOTE — Progress Notes (Signed)
Name: Seth Meadows   MRN: [id number]    DOB: [birth year]   Date:12/06/2016       Progress Note  Subjective  Chief Complaint  Chief Complaint  Patient presents with  . Medication Refill    BP meds  . Medication Problem    stop choesterol meds thinking his liver or kidney function decline due to it  . Hypertension    F/U    Hypertension  This is a chronic problem. The problem is unchanged. The problem is controlled. Pertinent negatives include no blurred vision, chest pain, headaches, palpitations, peripheral edema or shortness of breath. Past treatments include calcium channel blockers. There is no history of kidney disease, CAD/MI or CVA.  Hyperlipidemia  This is a chronic problem. Recent lipid tests were reviewed and are normal. Exacerbating diseases include obesity. Pertinent negatives include no chest pain or shortness of breath. Current antihyperlipidemic treatment includes statins (he is not taking Statin because he was concerned about elevated creatinine, liver enzymes were normal.).     Past Medical History:  Diagnosis Date  . Allergic rhinitis   . BPH with obstruction/lower urinary tract symptoms   . Cutaneous eruption   . Earache   . Epididymitis, left   . Hyperglycemia   . Hypertension   . Hypogonadism in male   . Kidney function test abnormal   . Overweight   . Scrotal pain   . Sleep apnea     Past Surgical History:  Procedure Laterality Date  . WISDOM TOOTH EXTRACTION      Family History  Problem Relation Age of Onset  . Hypertension Mother   . Diabetes Mother   . Hypertension Father   . Prostate cancer Father   . Kidney disease Neg Hx   . Bladder Cancer Neg Hx     Social History   Socioeconomic History  . Marital status: Married    Spouse name: Not on file  . Number of children: Not on file  . Years of education: Not on file  . Highest education level: Not on file  Social Needs  . Financial resource strain: Not on file  . Food insecurity -  worry: Not on file  . Food insecurity - inability: Not on file  . Transportation needs - medical: Not on file  . Transportation needs - non-medical: Not on file  Occupational History  . Not on file  Tobacco Use  . Smoking status: Never Smoker  . Smokeless tobacco: Never Used  Substance and Sexual Activity  . Alcohol use: No    Comment: occasional  . Drug use: No  . Sexual activity: Yes  Other Topics Concern  . Not on file  Social History Narrative  . Not on file     Current Outpatient Medications:  .  amLODipine (NORVASC) 5 MG tablet, Take 1 tablet (5 mg total) by mouth daily. (Patient taking differently: Take 10 mg daily by mouth. ), Disp: 90 tablet, Rfl: 0 .  testosterone cypionate (DEPOTESTOSTERONE CYPIONATE) 200 MG/ML injection, , Disp: , Rfl: 0 .  atorvastatin (LIPITOR) 10 MG tablet, Take 1 tablet (10 mg total) by mouth daily at 6 PM. (Patient not taking: Reported on 12/06/2016), Disp: 90 tablet, Rfl: 0 .  sildenafil (VIAGRA) 100 MG tablet, Take 1 tablet (100 mg total) by mouth daily as needed for erectile dysfunction. (Patient not taking: Reported on 12/06/2016), Disp: 10 tablet, Rfl: 12 .  terbinafine (LAMISIL) 250 MG tablet, Take 250 mg by mouth daily., Disp: , Rfl:  No Known Allergies   Review of Systems  Eyes: Negative for blurred vision.  Respiratory: Negative for shortness of breath.   Cardiovascular: Negative for chest pain and palpitations.  Neurological: Negative for headaches.     Objective  Vitals:   12/06/16 1453  BP: 138/86  Pulse: 80  Resp: 14  Temp: 97.8 F (36.6 C)  TempSrc: Oral  SpO2: 95%  Weight: 263 lb 12.8 oz (119.7 kg)    Physical Exam  Constitutional: He is oriented to person, place, and time and well-developed, well-nourished, and in no distress.  HENT:  Head: Normocephalic and atraumatic.  Cardiovascular: Normal rate, regular rhythm and normal heart sounds.  No murmur heard. Pulmonary/Chest: Effort normal and breath sounds  normal.  Abdominal: Soft. Bowel sounds are normal. He exhibits no distension. There is no tenderness.  Neurological: He is alert and oriented to person, place, and time.  Psychiatric: Mood, memory, affect and judgment normal.  Nursing note and vitals reviewed.    Assessment & Plan  1. Needs flu shot  - Flu Vaccine QUAD 6+ mos PF IM (Fluarix Quad PF)  2. Elevated serum creatinine Repeat serum creatinine and GFR and manage accordingly - COMPLETE METABOLIC PANEL WITH GFR  3. Essential hypertension Blood pressure stable on amlodipine 10 mg, refills provided - amLODipine (NORVASC) 10 MG tablet; Take 1 tablet (10 mg total) daily by mouth.  Dispense: 90 tablet; Refill: 0  4. Dyslipidemia  - Lipid panel ' [facility] Meadows [facility] 12/06/2016 3:06 PM

## 2016-12-07 DIAGNOSIS — E785 Hyperlipidemia, unspecified: Secondary | ICD-10-CM | POA: Diagnosis not present

## 2016-12-07 DIAGNOSIS — R7989 Other specified abnormal findings of blood chemistry: Secondary | ICD-10-CM | POA: Diagnosis not present

## 2016-12-07 LAB — COMPLETE METABOLIC PANEL WITH GFR
AG Ratio: 1.2 (calc) (ref 1.0–2.5)
ALT: 22 U/L (ref 9–46)
AST: 27 U/L (ref 10–35)
Albumin: 4.2 g/dL (ref 3.6–5.1)
Alkaline phosphatase (APISO): 73 U/L (ref 40–115)
BUN: 19 mg/dL (ref 7–25)
CO2: 29 mmol/L (ref 20–32)
Calcium: 9.2 mg/dL (ref 8.6–10.3)
Chloride: 101 mmol/L (ref 98–110)
Creat: 1.06 mg/dL (ref 0.70–1.33)
GFR, Est African American: 90 mL/min/{1.73_m2} (ref >=60)
GFR, Est Non African American: 78 mL/min/{1.73_m2} (ref >=60)
Globulin: 3.5 g/dL (calc) (ref 1.9–3.7)
Glucose, Bld: 93 mg/dL (ref 65–99)
Potassium: 4.2 mmol/L (ref 3.5–5.3)
Sodium: 138 mmol/L (ref 135–146)
Total Bilirubin: 0.7 mg/dL (ref 0.2–1.2)
Total Protein: 7.7 g/dL (ref 6.1–8.1)

## 2016-12-07 LAB — LIPID PANEL
Cholesterol: 180 mg/dL (ref <200)
HDL: 39 mg/dL — ABNORMAL LOW (ref >40)
LDL Cholesterol (Calc): 123 mg/dL (calc) — ABNORMAL HIGH
Non-HDL Cholesterol (Calc): 141 mg/dL (calc) — ABNORMAL HIGH (ref <130)
Total CHOL/HDL Ratio: 4.6 (calc) (ref <5.0)
Triglycerides: 83 mg/dL (ref <150)

## 2016-12-20 ENCOUNTER — Other Ambulatory Visit: Payer: Self-pay

## 2016-12-20 DIAGNOSIS — E785 Hyperlipidemia, unspecified: Secondary | ICD-10-CM

## 2016-12-20 MED ORDER — ATORVASTATIN CALCIUM 20 MG PO TABS: ORAL_TABLET | ORAL | 0 refills | Status: DC

## 2016-12-20 NOTE — Progress Notes (Signed)
Due to abnormal labs Dr. Manuella Ghazi started the patient on Lipitor 20 mg daily at bedtime.

## 2017-01-05 DIAGNOSIS — R7989 Other specified abnormal findings of blood chemistry: Secondary | ICD-10-CM | POA: Diagnosis not present

## 2017-01-18 DIAGNOSIS — G4733 Obstructive sleep apnea (adult) (pediatric): Secondary | ICD-10-CM | POA: Diagnosis not present

## 2017-01-23 HISTORY — PX: EYELID LACERATION REPAIR: SHX1564

## 2017-02-14 ENCOUNTER — Ambulatory Visit (INDEPENDENT_AMBULATORY_CARE_PROVIDER_SITE_OTHER): Payer: BLUE CROSS/BLUE SHIELD | Admitting: Family Medicine

## 2017-02-14 ENCOUNTER — Encounter: Payer: Self-pay | Admitting: Family Medicine

## 2017-02-14 VITALS — BP 130/80 | HR 80 | Temp 97.6°F | Resp 18 | Ht 73.0 in | Wt 261.0 lb

## 2017-02-14 DIAGNOSIS — H0013 Chalazion right eye, unspecified eyelid: Secondary | ICD-10-CM | POA: Diagnosis not present

## 2017-02-14 DIAGNOSIS — H019 Unspecified inflammation of eyelid: Secondary | ICD-10-CM | POA: Diagnosis not present

## 2017-02-14 NOTE — Progress Notes (Signed)
Name: Seth Meadows   MRN: [id number]    DOB: May 18, 1960   Date:02/14/2017       Progress Note  Subjective  Chief Complaint  Chief Complaint  Patient presents with  . Eye Problem    eye lid swollen, sore in center of lid for 1 month    HPI  Pt presents with concern for RIGHT eyelid infection - he notes that there has been a small bump that has been slowly growing for about a month on the RIGHT upper lid.  This morning, he woke up and his right upper lid is red, tender/painful, and a new raised white lesion is present to the center of the upper lid along the lash line.  He denies vision changes, but notes that the swelling is causing his lid to block his eye a bit.  Denies fevers/chills, body aches, recent travel, no recent insect bites, no pain with ocular movements.  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  Patient Active Problem List   Diagnosis Date Noted  . Need for hepatitis C screening test 07/12/2016  . Generalized abdominal pain 03/24/2016  . Vitamin D insufficiency 08/31/2015  . BPH with obstruction/lower urinary tract symptoms 04/30/2015  . Annual physical exam 03/16/2015  . Dyslipidemia 03/01/2015  . Erectile dysfunction of organic origin 10/29/2014  . Arthritis of lumbar spine 10/22/2014  . Right hip pain 09/21/2014  . Chronic mid back pain 09/21/2014  . Right thigh pain 08/20/2014  . Allergic rhinitis 08/17/2014  . Blood glucose elevated 08/17/2014  . Abnormal kidney function study 08/17/2014  . Hypogonadism in male 07/30/2014  . Sebaceous cyst 07/21/2014  . Failure of erection 07/21/2014  . Low testosterone 07/21/2014  . Low back strain 07/21/2014  . Hypertension     Social History   Tobacco Use  . Smoking status: Never Smoker  . Smokeless tobacco: Never Used  Substance Use Topics  . Alcohol use: No    Comment: occasional     Current Outpatient Medications:  .  amLODipine (NORVASC) 10 MG tablet, Take 1 tablet (10 mg  total) daily by mouth., Disp: 90 tablet, Rfl: 0 .  atorvastatin (LIPITOR) 20 MG tablet, Take 1 tablet ( 20 mg total) daily at bedtime., Disp: 90 tablet, Rfl: 0 .  sildenafil (VIAGRA) 100 MG tablet, Take 1 tablet (100 mg total) by mouth daily as needed for erectile dysfunction., Disp: 10 tablet, Rfl: 12 .  terbinafine (LAMISIL) 250 MG tablet, Take 250 mg by mouth daily., Disp: , Rfl:  .  testosterone cypionate (DEPOTESTOSTERONE CYPIONATE) 200 MG/ML injection, , Disp: , Rfl: 0  No Known Allergies  ROS  Constitutional: Negative for fever or weight change.  Respiratory: Negative for cough and shortness of breath.   Cardiovascular: Negative for chest pain or palpitations.  Gastrointestinal: Negative for abdominal pain, no bowel changes.  Musculoskeletal: Negative for gait problem or joint swelling.  Skin: Negative for rash.  Neurological: Negative for dizziness or headache.  No other specific complaints in a complete review of systems (except as listed in HPI above).  Objective  Vitals:   02/14/17 0949  BP: 130/80  Pulse: 80  Resp: 18  Temp: 97.6 F (36.4 C)  TempSrc: Oral  SpO2: 94%  Weight: 261 lb (118.4 kg)  Height: 6\' 1"  (1.854 m)   Body mass index is 34.43 kg/m.  Nursing Note and Vital Signs reviewed.  Physical Exam  Constitutional: Patient appears well-developed and well-nourished. Obese. No distress.  HEENT: head atraumatic, normocephalic, pupils equal and reactive to light, EOM's intact, TM's without erythema or bulging, no maxillary or frontal sinus tenderness, neck supple without lymphadenopathy, oropharynx pink and moist without exudate.  Right upper lid is red, tender to palpation, and a new raised white lesion is present to the center of the upper lid along the lash line with no drainage or bleeding.  Periorbital area is non-tender and non-edematous; no pain with ocular movements. Cardiovascular: Normal rate, regular rhythm, S1/S2 present.  No murmur or rub heard. No  BLE edema. Pulmonary/Chest: Effort normal and breath sounds clear. No respiratory distress or retractions. Psychiatric: Patient has a normal mood and affect. behavior is normal. Judgment and thought content normal.  No results found for this or any previous visit (from the past 72 hour(s)).  Assessment & Plan  1. Inflammatory lesion of eyelid - Ambulatory referral to Ophthalmology - Appointment at [facility] made for 1pm today with Dr. Thomasene Ripple. - [address] [place], [place] [address]; [phone]

## 2017-02-14 NOTE — Patient Instructions (Addendum)
-   Appointment at [facility] made for 1pm today with Dr. Thomasene Ripple. - [address] [place], [place] [address]; [phone]

## 2017-03-08 ENCOUNTER — Ambulatory Visit (INDEPENDENT_AMBULATORY_CARE_PROVIDER_SITE_OTHER): Payer: BLUE CROSS/BLUE SHIELD | Admitting: Family Medicine

## 2017-03-08 ENCOUNTER — Encounter: Payer: Self-pay | Admitting: Family Medicine

## 2017-03-08 DIAGNOSIS — E785 Hyperlipidemia, unspecified: Secondary | ICD-10-CM

## 2017-03-08 DIAGNOSIS — I10 Essential (primary) hypertension: Secondary | ICD-10-CM | POA: Diagnosis not present

## 2017-03-08 LAB — LIPID PANEL
Cholesterol: 109 mg/dL (ref <200)
HDL: 35 mg/dL — ABNORMAL LOW (ref >40)
LDL Cholesterol (Calc): 61 mg/dL (calc)
Non-HDL Cholesterol (Calc): 74 mg/dL (calc) (ref <130)
Total CHOL/HDL Ratio: 3.1 (calc) (ref <5.0)
Triglycerides: 58 mg/dL (ref <150)

## 2017-03-08 MED ORDER — ATORVASTATIN CALCIUM 20 MG PO TABS: ORAL_TABLET | ORAL | 1 refills | Status: DC

## 2017-03-08 MED ORDER — ATORVASTATIN CALCIUM 20 MG PO TABS: ORAL_TABLET | ORAL | 0 refills | Status: DC

## 2017-03-08 MED ORDER — AMLODIPINE BESYLATE 10 MG PO TABS: 10.0000 mg | ORAL_TABLET | Freq: Every day | ORAL | 0 refills | Status: DC

## 2017-03-08 MED ORDER — AMLODIPINE BESYLATE 10 MG PO TABS: 10.0000 mg | ORAL_TABLET | Freq: Every day | ORAL | 1 refills | Status: DC

## 2017-03-08 NOTE — Progress Notes (Signed)
Name: Seth Meadows   MRN: [id number]    DOB: Jan 20, 1961   Date:03/08/2017       Progress Note  Subjective  Chief Complaint  Chief Complaint  Patient presents with  . Follow-up    3 months  . Medication Refill    Hypertension  This is a chronic problem. The problem is unchanged. The problem is controlled. Pertinent negatives include no blurred vision, chest pain, headaches, palpitations, peripheral edema or shortness of breath. Past treatments include calcium channel blockers. There is no history of kidney disease, CAD/MI or CVA.  Hyperlipidemia  This is a chronic problem. The problem is uncontrolled. Recent lipid tests were reviewed and are high. Exacerbating diseases include obesity. Pertinent negatives include no chest pain, leg pain, myalgias or shortness of breath. Current antihyperlipidemic treatment includes statins.      Past Medical History:  Diagnosis Date  . Allergic rhinitis   . BPH with obstruction/lower urinary tract symptoms   . Cutaneous eruption   . Earache   . Epididymitis, left   . Hyperglycemia   . Hypertension   . Hypogonadism in male   . Kidney function test abnormal   . Overweight   . Scrotal pain   . Sleep apnea     Past Surgical History:  Procedure Laterality Date  . EYELID LACERATION REPAIR  01/2017   cut the the eye lid to drain the infection out  . WISDOM TOOTH EXTRACTION      Family History  Problem Relation Age of Onset  . Hypertension Mother   . Diabetes Mother   . Hypertension Father   . Prostate cancer Father   . Diabetes Brother   . Kidney disease Neg Hx   . Bladder Cancer Neg Hx     Social History   Socioeconomic History  . Marital status: Married    Spouse name: Not on file  . Number of children: Not on file  . Years of education: Not on file  . Highest education level: Not on file  Social Needs  . Financial resource strain: Not on file  . Food insecurity - worry: Not on file  . Food insecurity - inability: Not on file   . Transportation needs - medical: Not on file  . Transportation needs - non-medical: Not on file  Occupational History  . Not on file  Tobacco Use  . Smoking status: Never Smoker  . Smokeless tobacco: Never Used  Substance and Sexual Activity  . Alcohol use: No    Comment: occasional  . Drug use: No  . Sexual activity: Yes  Other Topics Concern  . Not on file  Social History Narrative  . Not on file     Current Outpatient Medications:  .  amLODipine (NORVASC) 10 MG tablet, Take 1 tablet (10 mg total) daily by mouth., Disp: 90 tablet, Rfl: 0 .  atorvastatin (LIPITOR) 20 MG tablet, Take 1 tablet ( 20 mg total) daily at bedtime., Disp: 90 tablet, Rfl: 0 .  sildenafil (VIAGRA) 100 MG tablet, Take 1 tablet (100 mg total) by mouth daily as needed for erectile dysfunction., Disp: 10 tablet, Rfl: 12 .  testosterone cypionate (DEPOTESTOSTERONE CYPIONATE) 200 MG/ML injection, , Disp: , Rfl: 0 .  terbinafine (LAMISIL) 250 MG tablet, Take 250 mg by mouth daily., Disp: , Rfl:   No Known Allergies   Review of Systems  Eyes: Negative for blurred vision.  Respiratory: Negative for shortness of breath.   Cardiovascular: Negative for chest pain and palpitations.  Musculoskeletal: Negative for myalgias.  Neurological: Negative for headaches.      Objective  Vitals:   03/08/17 1024  BP: 124/76  Pulse: 65  Resp: 14  Temp: (!) 97.4 F (36.3 C)  TempSrc: Oral  SpO2: 98%  Weight: 254 lb 14.4 oz (115.6 kg)  Height: 6\' 1"  (1.854 m)    Physical Exam  Constitutional: He is oriented to person, place, and time and well-developed, well-nourished, and in no distress.  HENT:  Head: Normocephalic and atraumatic.  Cardiovascular: Normal rate, regular rhythm and normal heart sounds.  No murmur heard. Pulmonary/Chest: Effort normal and breath sounds normal.  Abdominal: Soft. Bowel sounds are normal. He exhibits no distension. There is no tenderness.  Musculoskeletal: He exhibits no edema.   Neurological: He is alert and oriented to person, place, and time.  Psychiatric: Mood, memory, affect and judgment normal.  Nursing note and vitals reviewed.     Assessment & Plan  1. Dyslipidemia FLP above goal on November 2018, repeat today and continue on statin - Lipid panel - atorvastatin (LIPITOR) 20 MG tablet; Take 1 tablet ( 20 mg total) daily at bedtime.  Dispense: 90 tablet; Refill: 1  2. Essential hypertension BP stable on present antihypertensive treatment - amLODipine (NORVASC) 10 MG tablet; Take 1 tablet (10 mg total) by mouth daily.  Dispense: 90 tablet; Refill: 1  Bently Morath [facility] 03/08/2017 10:40 AM

## 2017-03-19 DIAGNOSIS — R7989 Other specified abnormal findings of blood chemistry: Secondary | ICD-10-CM | POA: Diagnosis not present

## 2017-03-27 IMAGING — CR DG FEMUR 2+V*R*
1 series · 4 of 4 positions shown · non-contrast
Comparison: None.

CLINICAL DATA: Right hip pain extending to the knee for 3 months.

EXAM:
RIGHT FEMUR 2 VIEWS

[Series 1: view not recorded · 0.14mm/px · 4 of 4 slices shown]
[im 1/4]
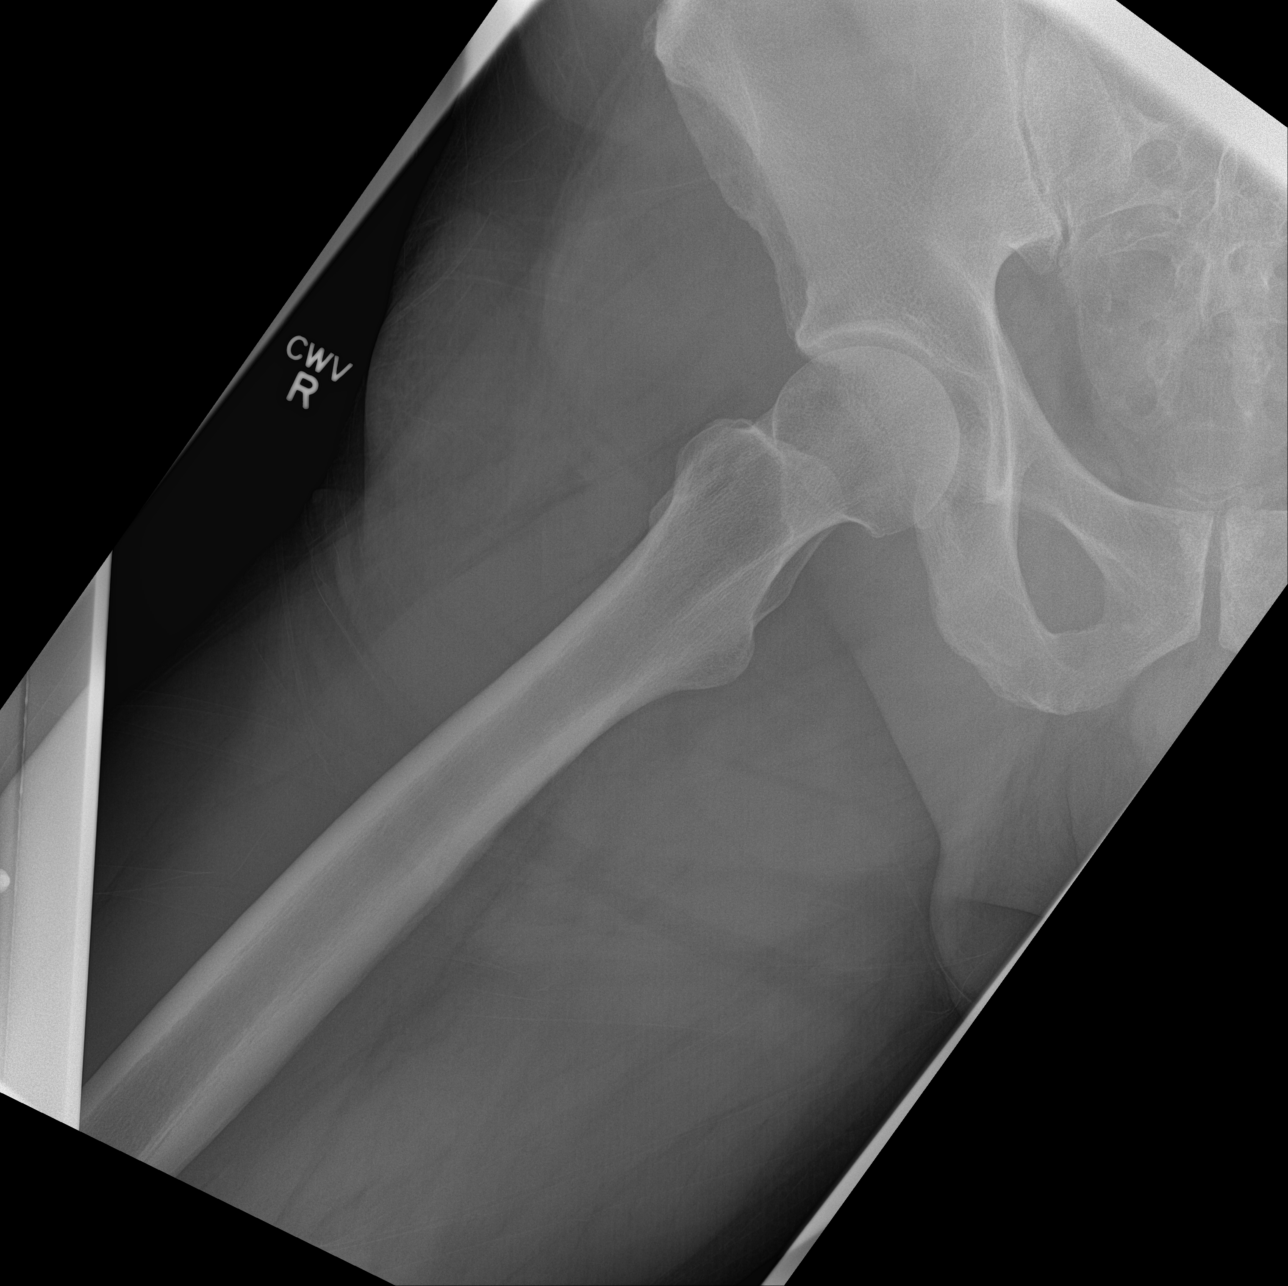
[im 2/4]
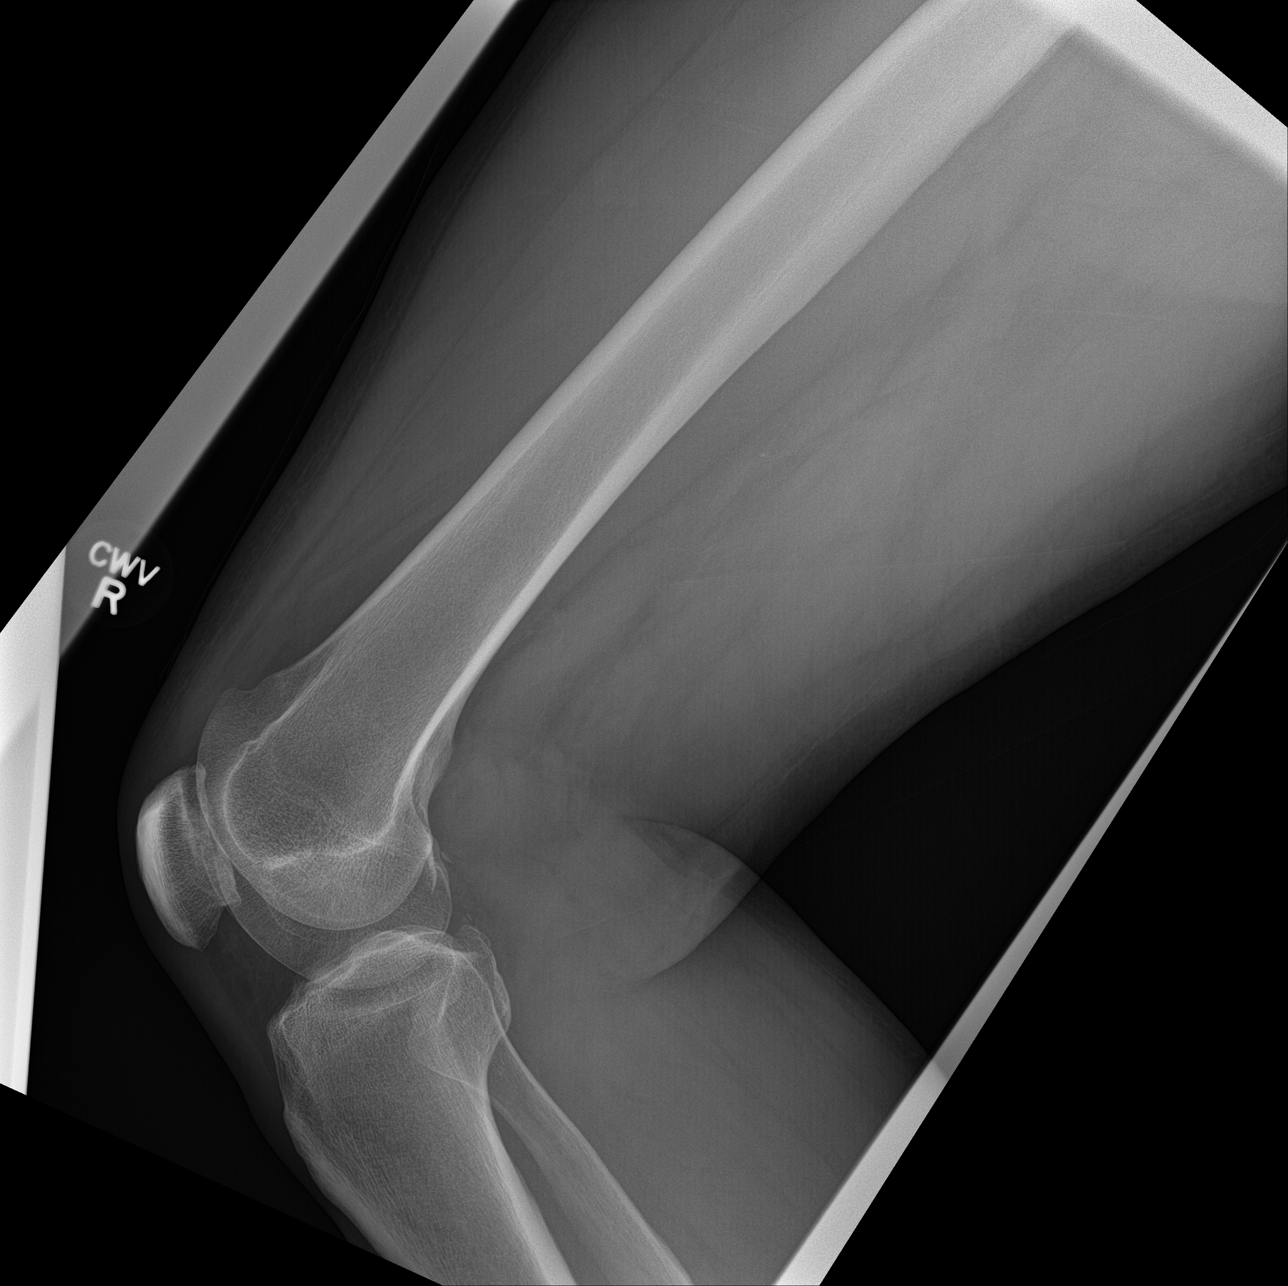
[im 3/4]
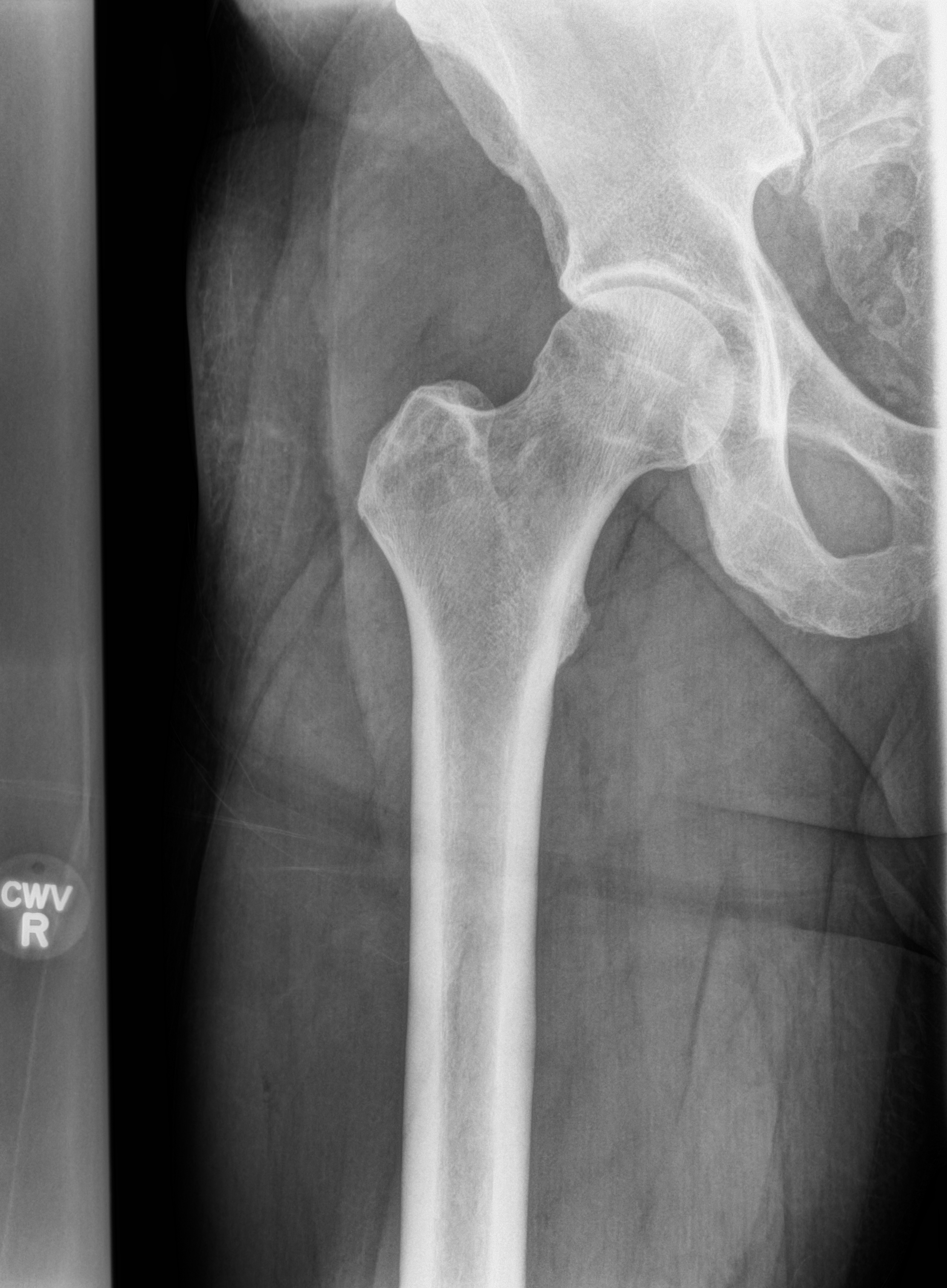
[im 4/4]
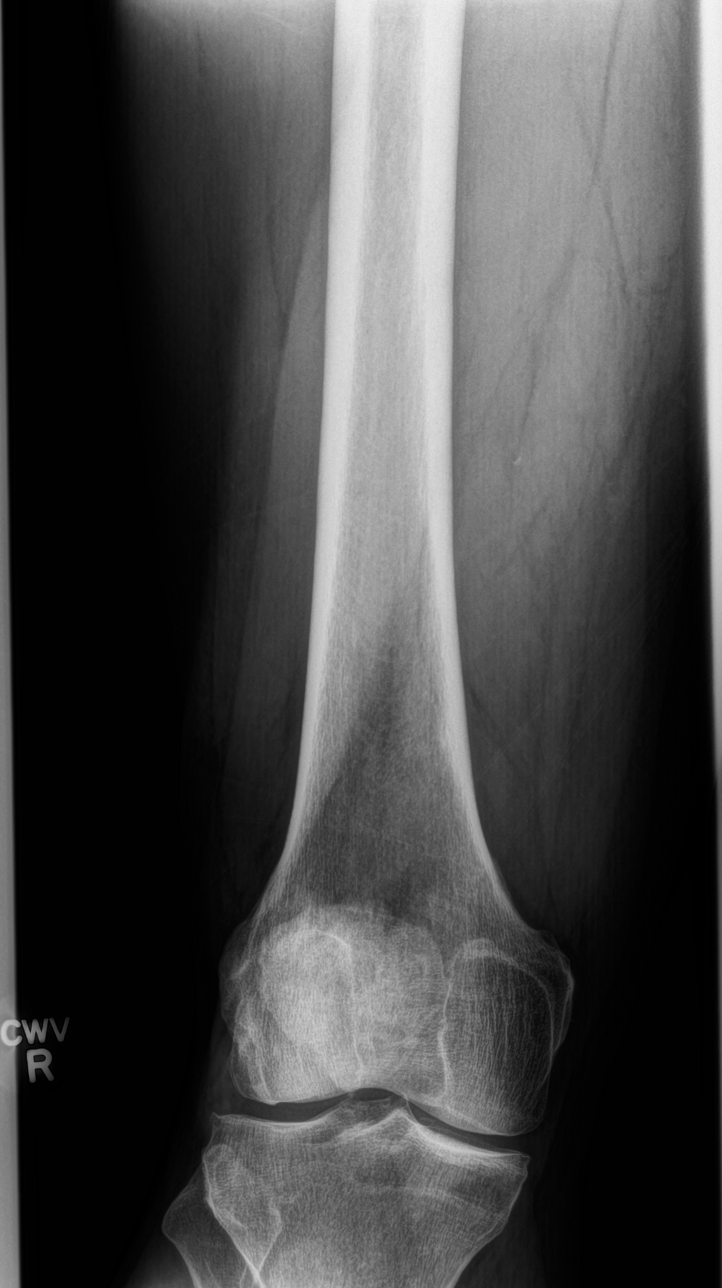

[4 of 4 positions shown; findings below may reference images not displayed]

FINDINGS: There is no fracture or bone destruction. There is osteoarthritis at
the knee joint with small tricompartmental marginal osteophytes. No
appreciable joint effusions.
IMPRESSION: Slight arthritic changes at the knee.  Otherwise normal right femur.

## 2017-04-16 DIAGNOSIS — L821 Other seborrheic keratosis: Secondary | ICD-10-CM | POA: Diagnosis not present

## 2017-04-16 DIAGNOSIS — X32XXXD Exposure to sunlight, subsequent encounter: Secondary | ICD-10-CM | POA: Diagnosis not present

## 2017-04-16 DIAGNOSIS — Z1283 Encounter for screening for malignant neoplasm of skin: Secondary | ICD-10-CM | POA: Diagnosis not present

## 2017-04-16 DIAGNOSIS — L570 Actinic keratosis: Secondary | ICD-10-CM | POA: Diagnosis not present

## 2017-04-18 DIAGNOSIS — G4733 Obstructive sleep apnea (adult) (pediatric): Secondary | ICD-10-CM | POA: Diagnosis not present

## 2017-05-09 IMAGING — CR DG THORACIC SPINE 3V
1 series · 3 of 3 positions shown · non-contrast
Comparison: None in PACs

CLINICAL DATA: Chronic intermittent thoracic and lumbar pain for
several years without known injury; numbness in the right leg.

EXAM:
THORACIC SPINE - 3 VIEWS

[Series 1: view not recorded · 0.14mm/px · 3 of 3 slices shown]
[im 1/3]
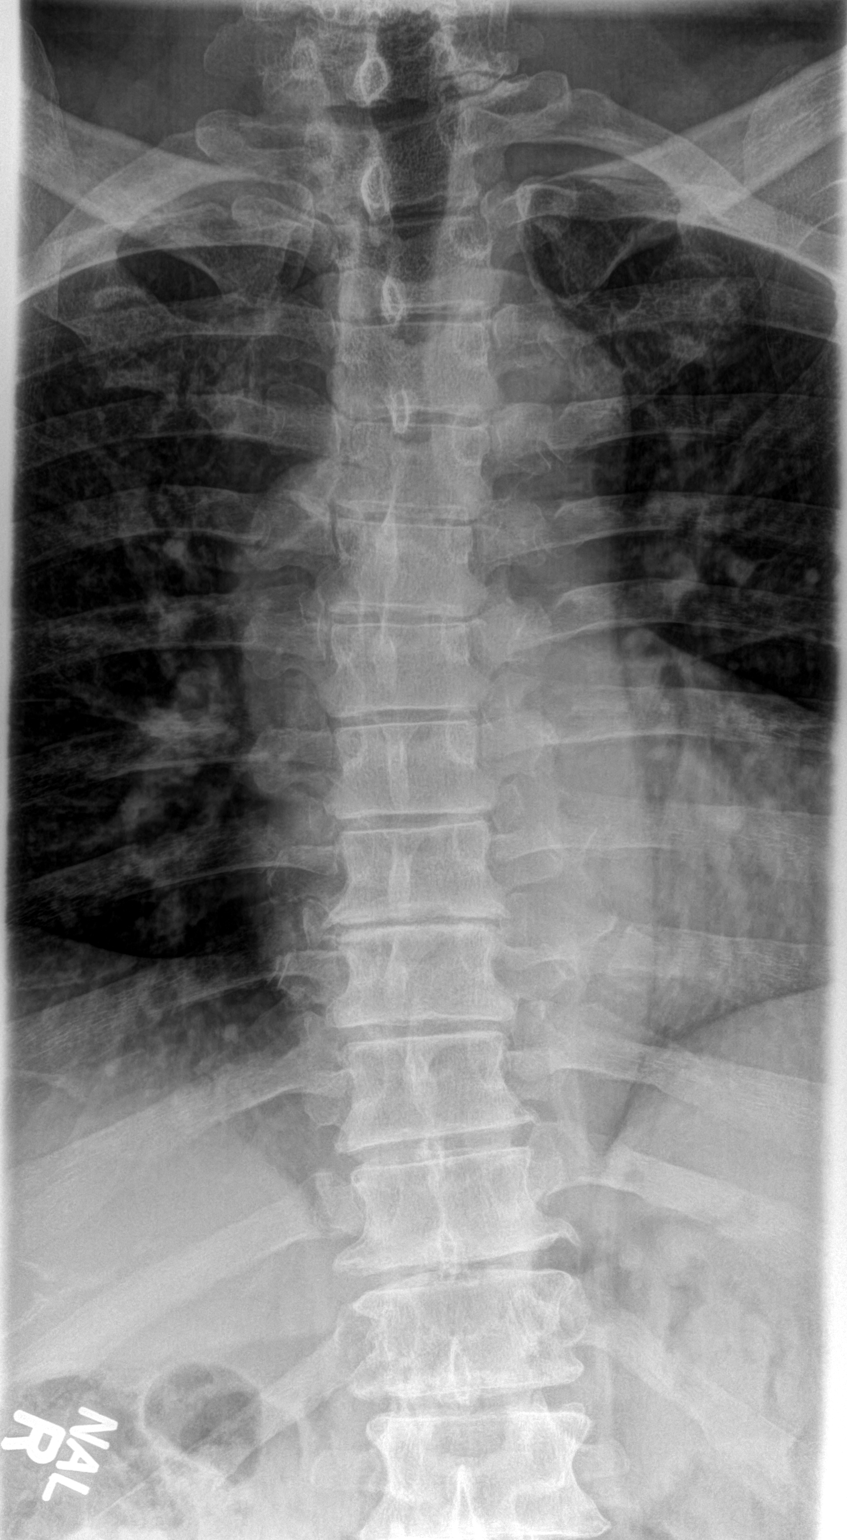
[im 2/3]
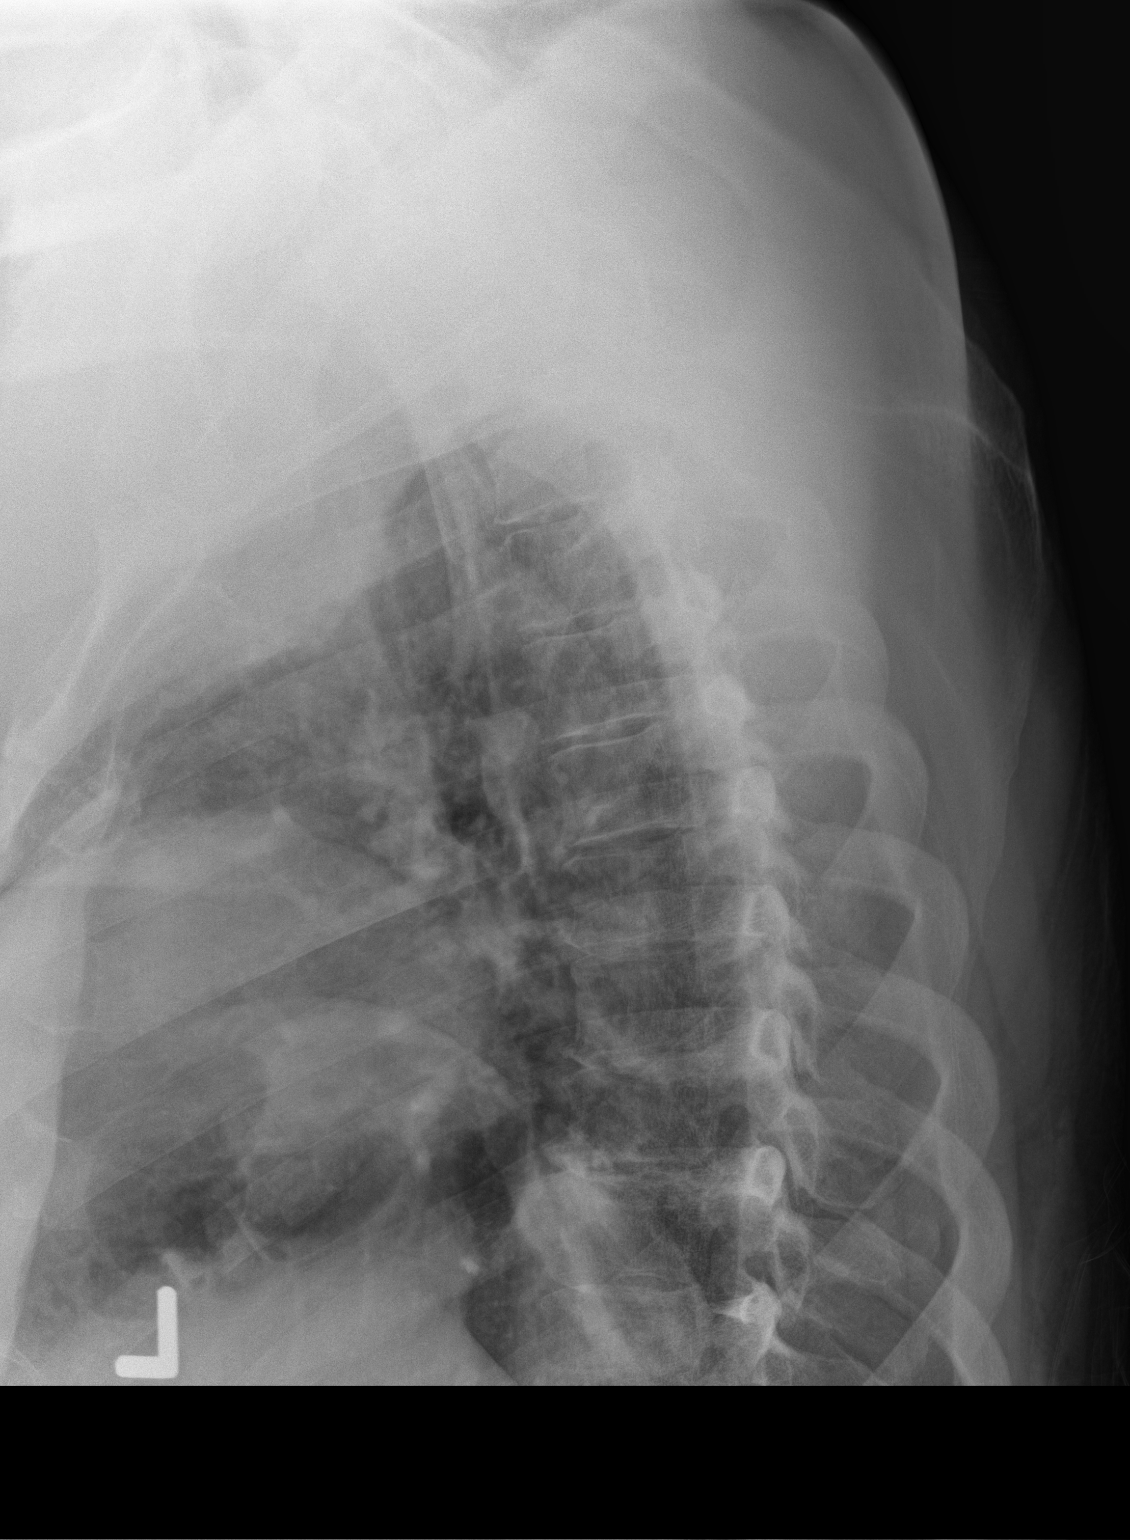
[im 3/3]
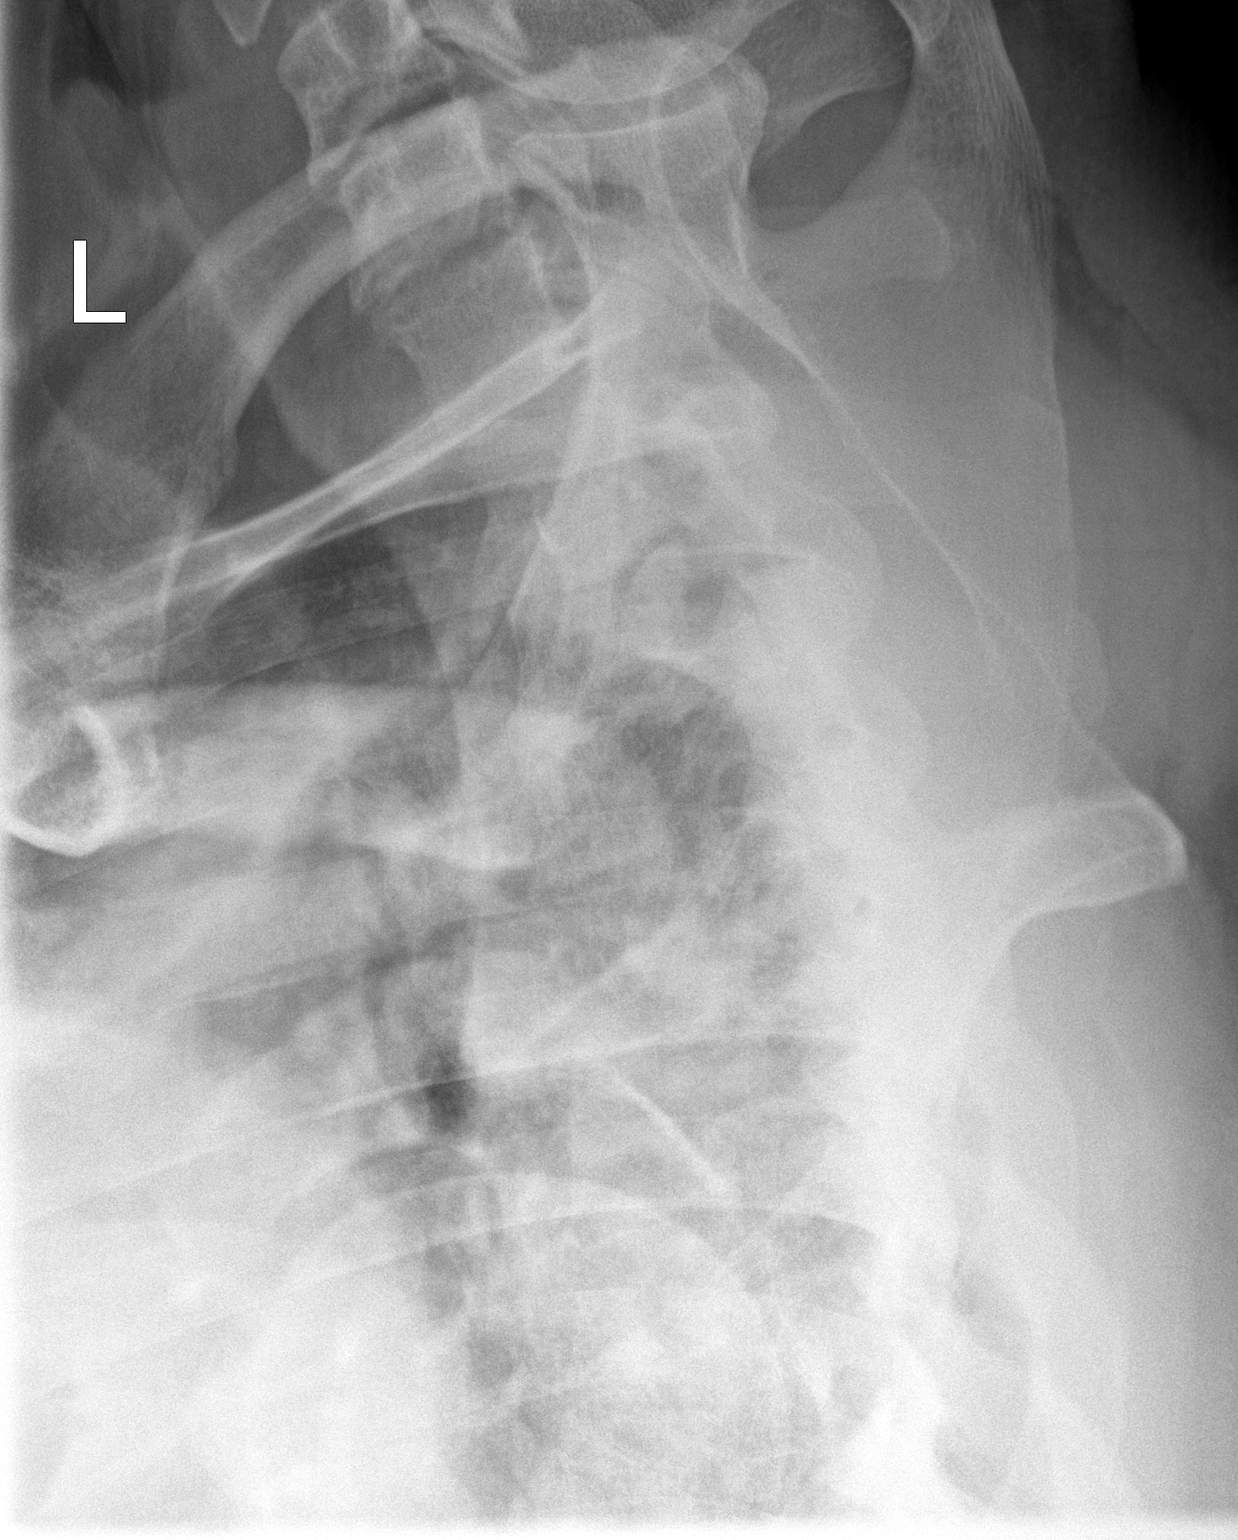

[3 of 3 positions shown; findings below may reference images not displayed]

FINDINGS: The thoracic vertebral bodies are preserved in height. There is
minimal dextro curvature centered in the mid thoracic spine. The
pedicles are intact. There is mild multilevel degenerative disc
space narrowing. The cervicothoracic junction is grossly normal.
There are no abnormal paravertebral soft tissue densities.
IMPRESSION: There is mild multilevel degenerative disc disease of the thoracic
spine. There is no compression fracture nor other acute bony
abnormality.

## 2017-05-09 IMAGING — CR DG LUMBAR SPINE COMPLETE 4+V
1 series · 1 of 1 positions shown · non-contrast
Comparison: None in PACs

CLINICAL DATA: Chronic intermittent thoracic and lumbar spine pain
the right leg.

EXAM:
LUMBAR SPINE - COMPLETE 4+ VIEW

[view not recorded]
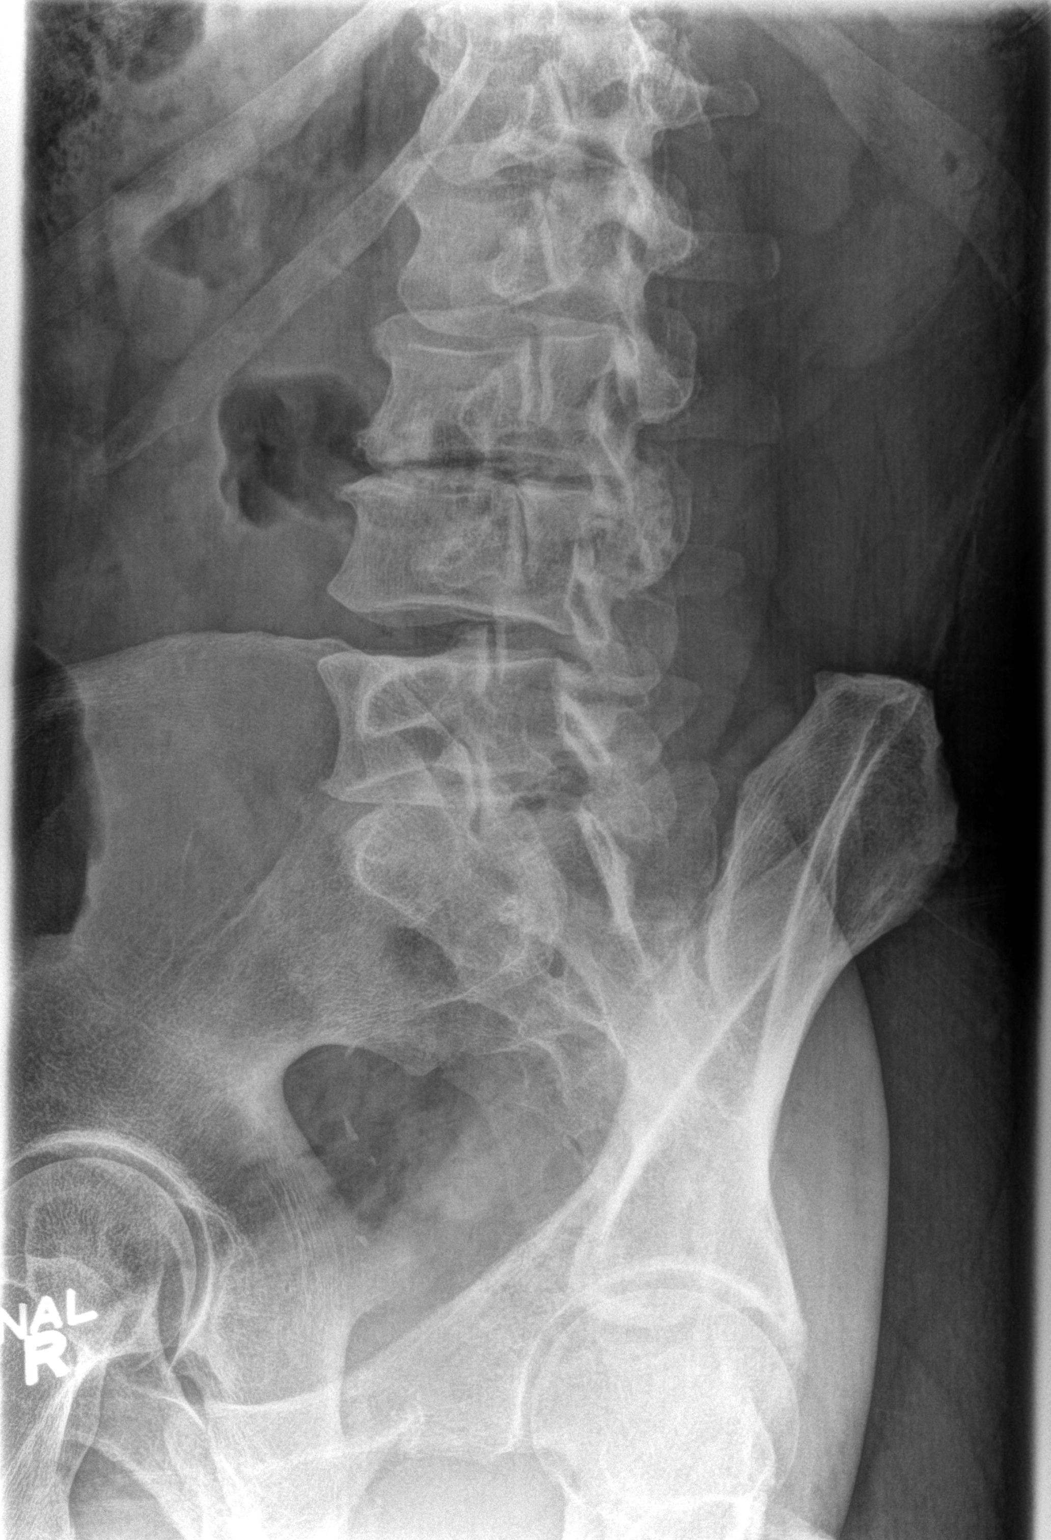

[1 of 1 positions shown; findings below may reference images not displayed]

FINDINGS: The lumbar vertebral bodies are preserved in height. There is
moderate to severe disc space narrowing at L3-4 with milder
narrowing at T12-L1. There is no spondylolisthesis. There is mild
facet joint hypertrophy at L4-5 and at L5-S1. There are anterior and
lateral endplate osteophytes at T12-L1 as well as at L3-L4. The
pedicles and transverse processes are intact. The observed portions
of the sacrum are normal.
IMPRESSION: There is moderate degenerative disc disease as described. There is
also mild facet joint hypertrophy. There is no compression fracture
nor other acute bony abnormality. If the patient has true radicular
symptoms, lumbar spine MRI may be useful.

## 2017-06-14 DIAGNOSIS — R7989 Other specified abnormal findings of blood chemistry: Secondary | ICD-10-CM | POA: Diagnosis not present

## 2017-07-13 ENCOUNTER — Encounter: Payer: BLUE CROSS/BLUE SHIELD | Admitting: Family Medicine

## 2017-08-23 DIAGNOSIS — G4733 Obstructive sleep apnea (adult) (pediatric): Secondary | ICD-10-CM | POA: Diagnosis not present

## 2017-08-23 DIAGNOSIS — R7989 Other specified abnormal findings of blood chemistry: Secondary | ICD-10-CM | POA: Diagnosis not present

## 2017-08-27 DIAGNOSIS — L820 Inflamed seborrheic keratosis: Secondary | ICD-10-CM | POA: Diagnosis not present

## 2017-08-27 DIAGNOSIS — B078 Other viral warts: Secondary | ICD-10-CM | POA: Diagnosis not present

## 2017-10-03 ENCOUNTER — Encounter: Payer: Self-pay | Admitting: Medical

## 2017-10-03 ENCOUNTER — Ambulatory Visit: Payer: BLUE CROSS/BLUE SHIELD | Admitting: Medical

## 2017-10-03 VITALS — BP 132/93 | HR 74 | Temp 97.8°F | Resp 16 | Wt 237.2 lb

## 2017-10-03 DIAGNOSIS — M7701 Medial epicondylitis, right elbow: Secondary | ICD-10-CM

## 2017-10-03 MED ORDER — IBUPROFEN 800 MG PO TABS: 800.0000 mg | ORAL_TABLET | Freq: Three times a day (TID) | ORAL | 0 refills | Status: DC | PRN

## 2017-10-03 NOTE — Progress Notes (Addendum)
   Subjective:    Patient ID: Seth Meadows, male    DOB: Sep 12, 1960, 57 y.o.   MRN: [id number]  HPI  57 yo male in non acuate distress. Presents today with complaints of right sided inside elbow pain x 2 months. Taking Aleve for pain last week, not sure if it helped. Elbow not hurting as much as last week. Today it is 1-2 / 10. He is right handed. 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  No previous history of elbow pain. No weakness in grip.Denies any known injury Sore to touch on the inside of elbow, when  resting it on an arm of a chair, or any pressure on it would cause pain. Denies numbness or tingling.   Review of Systems  Constitutional: Negative for chills and fever.  HENT: Negative for congestion, ear discharge and sore throat.   Musculoskeletal:       Right medial elbow pain.  Skin: Negative for rash.  Psychiatric/Behavioral: Negative for behavioral problems, confusion, self-injury and suicidal ideas.       Objective:   Physical Exam  Constitutional: He is oriented to person, place, and time. He appears well-developed and well-nourished.  HENT:  Head: Normocephalic and atraumatic.  Eyes: Pupils are equal, round, and reactive to light. Conjunctivae and EOM are normal.  Neck: Normal range of motion.  Musculoskeletal: He exhibits tenderness ("I can feel it").  Neurological: He is alert and oriented to person, place, and time.  Skin: Skin is warm and dry.  Psychiatric: He has a normal mood and affect. His behavior is normal. Judgment and thought content normal.  Nursing note and vitals reviewed.    No swelling or redness noted to the skin of right elbow. Equal girp bilateral  5/5 2+ radial pulse. Mild tenderness with palpation.  Can feel it with passive wrist flexion and resisted wrist extension. No pain on supination. FROM of arm.     Assessment & Plan:  Golfers elbow ( medial epicondylitis) Stop Aleve Meds ordered this encounter  Medications  .  ibuprofen (ADVIL,MOTRIN) 800 MG tablet    Sig: Take 1 tablet (800 mg total) by mouth every 8 (eight) hours as needed for moderate pain.    Dispense:  30 tablet    Refill:  0  Exercises given to patient to complete.  Follow up in 10-14 days for recheck here or with your family doctor.  Ice on /off to the area.  Recommended ergonomic evaluation at work.  To follow up with primary doctor on elevated blood pressure.  Patient verbalizes understanding and has no questions at discharge.

## 2017-10-03 NOTE — Patient Instructions (Addendum)
Golfer's Elbow Rehab Ask your health care provider which exercises are safe for you. Do exercises exactly as told by your health care provider and adjust them as directed. It is normal to feel mild stretching, pulling, tightness, or discomfort as you do these exercises, but you should stop right away if you feel sudden pain or your pain gets worse. Do not begin these exercises until told by your health care provider. Stretching and range of motion exercises These exercises warm up your muscles and joints and improve the movement and flexibility of your elbow. Exercise A: Wrist flexors  1. Straighten your left / right elbow in front of you with your palm up. If told by your health care provider, do this stretch with your elbow bent rather than straight. 2. With your other hand, gently pull your left / right hand and fingers toward you until you feel a gentle stretch on the top of your forearm. 3. Hold this position for __________ seconds. Repeat __________ times. Complete this exercise __________ times a day. Strengthening exercises These exercises build strength and endurance in your elbow. Endurance is the ability to use your muscles for a long time, even after several repetitions. Exercise B: Wrist flexion  1. Sit with your left / right forearm palm-up and supported on a table or other surface. 2. Let your left / right wrist extend over the edge of the surface. 3. Hold a __________ weight or a piece of rubber exercise band or tubing. Slowly curl your hand up toward your forearm. Try to only move your hand and keep the rest of your arm still. 4. Hold this position for __________ seconds. 5. Slowly return to the starting position. Repeat __________ times. Complete this exercise __________ times a day. Exercise C: Eccentric wrist flexion 1. Sit with your left / right forearm palm-up and supported on a table or other surface. 2. Let your left / right wrist extend over the edge of the  surface. 3. Hold a __________ weight or a piece of rubber exercise band or tubing. 4. Use your other hand to move your left / right hand up toward your forearm. 5. Slowly return to the starting position using only your left / right hand. Repeat __________ times. Complete this exercise __________ times a day. Exercise D: Hand turns, pronation i 1. Sit with your left / right forearm supported on a table or other surface. 2. Move your wrist so your pinkie travels toward your forearm and your thumb moves away from your forearm. 3. Hold this position for __________ seconds. 4. Slowly return to the starting position. Exercise E: Hand turns, pronation ii  1. Sit with your left / right forearm supported on a table or other surface. 2. Hold a hammer in your left / right hand. ? The exercise will be easier if you hold on near the head of the hammer. ? If you hold on toward the end of the handle, the exercise will be harder. 3. Rest your left / right hand over the edge of the table with your palm facing up. 4. Without moving your elbow, slowly turn your palm and your hand down toward the table. 5. Hold this position for __________ seconds. 6. Slowly return to the starting position. Repeat __________ times. Complete this exercise __________ times a day. Exercise F: Shoulder blade squeeze 1. Sit in a stable chair with good posture. Do not let your back touch the back of the chair. 2. Your arms should be at your   sides with your elbows bent. You can rest your forearms on a pillow. 3. Squeeze your shoulder blades together. Keep your shoulders level. Do not lift your shoulders up toward your ears. 4. Hold this position for __________ seconds. 5. Slowly release. Repeat __________ times. Complete this exercise __________ times a day. This information is not intended to replace advice given to you by your health care provider. Make sure you discuss any questions you have with your health care  provider. Document Released: 01/09/2005 Document Revised: 09/16/2015 Document Reviewed: 09/21/2014 Elsevier Interactive Patient Education  2018 [place] Golfer's elbow, also called medial epicondylitis, is a condition that results from inflammation of the strong bands of tissue (tendons) that attach your forearm muscles to the inside of your bone at the elbow. These tendons affect the muscles that bend the palm toward the wrist (flexion). This condition is called golfer's elbow because it is more common among people who constantly bend and twist their wrists, such as golfers. This injury usually results from overuse. Tendons also become less flexible with age. This condition causes elbow pain that may spread to your forearm and upper arm. The pain may get worse when you bend your wrist downward. What are the causes? This condition is an overuse injury that is caused by:  Repeatedly flexing, turning, or twisting your wrist.  Constantly gripping objects with your hands.  What increases the risk? This condition is more likely to develop in people who play golf or tennis or have jobs that require the constant use of their hands. This injury is more common among:  Carpenters.  Gardeners.  Musicians.  Bricklayers.  Typists.  What are the signs or symptoms? Symptoms of this condition include:  Pain near the inner elbow or forearm.  Reduced grip strength.  How is this diagnosed? This condition is diagnosed based on your symptoms, medical history, and physical exam. During the exam, your health care provid Golfer's Elbow Golfer's elbow, also called medial epicondylitis, is a condition that results from inflammation of the strong bands of tissue (tendons) that attach your forearm muscles to the inside of your bone at the elbow. These tendons affect the muscles that bend the palm toward the wrist (flexion). This condition is called golfer's elbow because it is more  common among people who constantly bend and twist their wrists, such as golfers. This injury usually results from overuse. Tendons also become less flexible with age. This condition causes elbow pain that may spread to your forearm and upper arm. The pain may get worse when you bend your wrist downward. What are the causes? This condition is an overuse injury that is caused by:  Repeatedly flexing, turning, or twisting your wrist.  Constantly gripping objects with your hands.  What increases the risk? This condition is more likely to develop in people who play golf or tennis or have jobs that require the constant use of their hands. This injury is more common among:  Carpenters.  Gardeners.  Musicians.  Bricklayers.  Typists.  What are the signs or symptoms? Symptoms of this condition include:  Pain near the inner elbow or forearm.  Reduced grip strength.  How is this diagnosed? This condition is diagnosed based on your symptoms, medical history, and physical exam. During the exam, your health care provider may test your grip strength and move your wrist to check for pain. You may also have an MRI to confirm the diagnosis, look for other issues, and check for tears in  the ligaments, muscles, or tendons. How is this treated? Treatment for this condition includes:  Stopping all activities that make you bend or twist your wrist until your pain and other symptoms go away.  Icing your wrist to relieve pain.  Taking NSAIDs or getting corticosteroid injections to reduce pain and swelling.  Doing stretches, range-of-motion, and strengthening exercises (physical therapy) as told by your health care provider.  In rare cases, surgery may be needed if your condition does not improve. Follow these instructions at home:  If directed, apply ice to the injured area. ? Put ice in a plastic bag. ? Place a towel between your skin and the bag. ? Leave the ice on for 20 minutes, 2-3 times  a day.  Move your fingers often to avoid stiffness.  Raise (elevate) the injured area above the level of your heart while you are sitting or lying down.  Return to your normal activities as told by your health care provider. Ask your health care provider what activities are safe for you.  Do exercises as told by your health care provider.  Do not use tobacco products, including cigarettes, chewing tobacco, or e-cigarettes. If you need help quitting, ask your health care provider.  Take over-the-counter and prescription medicines only as told by your health care provider.  Keep all follow-up visits as told by your health care provider. This is important. How is this prevented?  Warm up and stretch before being active.  Cool down and stretch after being active.  Give your body time to rest between periods of activity.  Make sure to use equipment that fits you.  Be safe and responsible while being active to avoid falls.  Do at least 150 minutes of moderate-intensity exercise each week, such as brisk walking or water aerobics.  Maintain physical fitness, including: ? Strength. ? Flexibility. ? Cardiovascular fitness. ? Endurance.  Perform exercises to strengthen the forearm muscles.  Slow your golf swing to reduce shock in the arm when making contact with the ball, if you play golf. Contact a health care provider if:  Your pain does not improve or it gets worse.  You notice numbness in your hand. Get help right away if:  Your pain is severe.  You cannot move your wrist. This information is not intended to replace advice given to you by your health care provider. Make sure you discuss any questions you have with your health care provider. Document Released: 01/09/2005 Document Revised: 09/14/2015 Document Reviewed: 09/21/2014 Elsevier Interactive Patient Education  2018 [place] may test your grip strength and move your wrist to check for pain. You may also have  an MRI to confirm the diagnosis, look for other issues, and check for tears in the ligaments, muscles, or tendons. How is this treated? Treatment for this condition includes:  Stopping all activities that make you bend or twist your wrist until your pain and other symptoms go away.  Icing your wrist to relieve pain.  Taking NSAIDs or getting corticosteroid injections to reduce pain and swelling.  Doing stretches, range-of-motion, and strengthening exercises (physical therapy) as told by your health care provider.  In rare cases, surgery may be needed if your condition does not improve. Follow these instructions at home:  If directed, apply ice to the injured area. ? Put ice in a plastic bag. ? Place a towel between your skin and the bag. ? Leave the ice on for 20 minutes, 2-3 times a day.  Move your fingers  often to avoid stiffness.  Raise (elevate) the injured area above the level of your heart while you are sitting or lying down.  Return to your normal activities as told by your health care provider. Ask your health care provider what activities are safe for you.  Do exercises as told by your health care provider.  Do not use tobacco products, including cigarettes, chewing tobacco, or e-cigarettes. If you need help quitting, ask your health care provider.  Take over-the-counter and prescription medicines only as told by your health care provider.  Keep all follow-up visits as told by your health care provider. This is important. How is this prevented?  Warm up and stretch before being active.  Cool down and stretch after being active.  Give your body time to rest between periods of activity.  Make sure to use equipment that fits you.  Be safe and responsible while being active to avoid falls.  Do at least 150 minutes of moderate-intensity exercise each week, such as brisk walking or water aerobics.  Maintain physical fitness,  including: ? Strength. ? Flexibility. ? Cardiovascular fitness. ? Endurance.  Perform exercises to strengthen the forearm muscles.  Slow your golf swing to reduce shock in the arm when making contact with the ball, if you play golf. Contact a health care provider if:  Your pain does not improve or it gets worse.  You notice numbness in your hand. Get help right away if:  Your pain is severe.  You cannot move your wrist. This information is not intended to replace advice given to you by your health care provider. Make sure you discuss any questions you have with your health care provider. Document Released: 01/09/2005 Document Revised: 09/14/2015 Document Reviewed: 09/21/2014 Elsevier Interactive Patient Education  [employer].

## 2017-10-12 ENCOUNTER — Ambulatory Visit: Payer: BLUE CROSS/BLUE SHIELD | Admitting: Family Medicine

## 2017-10-12 ENCOUNTER — Encounter: Payer: Self-pay | Admitting: Family Medicine

## 2017-10-12 VITALS — BP 122/70 | HR 81 | Temp 97.5°F | Ht 73.0 in | Wt 239.4 lb

## 2017-10-12 DIAGNOSIS — Z23 Encounter for immunization: Secondary | ICD-10-CM | POA: Diagnosis not present

## 2017-10-12 DIAGNOSIS — G5621 Lesion of ulnar nerve, right upper limb: Secondary | ICD-10-CM | POA: Diagnosis not present

## 2017-10-12 MED ORDER — NAPROXEN 500 MG PO TABS: 500.0000 mg | ORAL_TABLET | Freq: Two times a day (BID) | ORAL | 0 refills | Status: DC

## 2017-10-12 NOTE — Progress Notes (Signed)
Name: Seth Meadows   MRN: [id number]    DOB: [birth year]   Date:10/12/2017       Progress Note  Subjective  Chief Complaint  Chief Complaint  Patient presents with  . Elbow Pain    right onset 2 months    HPI  Pt presents to follow up on right medial elbow pain that has been ongoing for several months (approximately June 2019).  No particular injury occurred. He notes pain is intermittent and has good days and bad days.  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  Notes pain is worse when resting elbow on armrest; denies numbness/tingling of the digits or arm.  He has been taking ibuprofen without relief; does note that he took aleve earlier on and this seemed to help his pain more.  He has been using ice as well without relief of pain.  Discussed several options, suspect cubital tunnel syndrome vs golfer's elbow.  He would like to try PT first, we will place referral.  If not improving, will send to Ortho for further evaluation.  Patient Active Problem List   Diagnosis Date Noted  . Need for hepatitis C screening test 07/12/2016  . Generalized abdominal pain 03/24/2016  . Vitamin D insufficiency 08/31/2015  . BPH with obstruction/lower urinary tract symptoms 04/30/2015  . Annual physical exam 03/16/2015  . Dyslipidemia 03/01/2015  . Erectile dysfunction of organic origin 10/29/2014  . Arthritis of lumbar spine 10/22/2014  . Right hip pain 09/21/2014  . Chronic mid back pain 09/21/2014  . Right thigh pain 08/20/2014  . Allergic rhinitis 08/17/2014  . Blood glucose elevated 08/17/2014  . Abnormal kidney function study 08/17/2014  . Hypogonadism in male 07/30/2014  . Sebaceous cyst 07/21/2014  . Failure of erection 07/21/2014  . Low testosterone 07/21/2014  . Low back strain 07/21/2014  . Hypertension     Social History   Tobacco Use  . Smoking status: Never Smoker  . Smokeless tobacco: Never Used  Substance Use Topics  . Alcohol use: No    Comment: occasional      Current Outpatient Medications:  .  amLODipine (NORVASC) 10 MG tablet, Take 1 tablet (10 mg total) by mouth daily. (Patient taking differently: Take 5 mg by mouth daily. ), Disp: 90 tablet, Rfl: 1 .  ibuprofen (ADVIL,MOTRIN) 800 MG tablet, Take 1 tablet (800 mg total) by mouth every 8 (eight) hours as needed for moderate pain., Disp: 30 tablet, Rfl: 0 .  testosterone cypionate (DEPOTESTOSTERONE CYPIONATE) 200 MG/ML injection, , Disp: , Rfl: 0 .  atorvastatin (LIPITOR) 20 MG tablet, Take 1 tablet ( 20 mg total) daily at bedtime. (Patient not taking: Reported on 10/12/2017), Disp: 90 tablet, Rfl: 1  No Known Allergies  I personally reviewed active problem list, medication list, allergies with the patient/caregiver today.  ROS  Ten systems reviewed and is negative except as mentioned in HPI.  Objective  Vitals:   10/12/17 0907  BP: 122/70  Pulse: 81  Temp: (!) 97.5 F (36.4 C)  TempSrc: Oral  SpO2: 99%  Weight: 239 lb 6.4 oz (108.6 kg)  Height: 6\' 1"  (1.854 m)   Body mass index is 31.59 kg/m.  Nursing Note and Vital Signs reviewed.  Physical Exam  Constitutional: Patient appears well-developed and well-nourished. No distress.  HENT: Head: Normocephalic and atraumatic. Ears: bilateral TMs with no erythema or effusion; Nose: Nose normal. Mouth/Throat: Oropharynx is clear and moist. No oropharyngeal exudate or tonsillar swelling.  Eyes: Conjunctivae  and EOM are normal. No scleral icterus.  Pupils are equal, round, and reactive to light.  Neck: Normal range of motion. Neck supple. No JVD present. No thyromegaly present.  Cardiovascular: Normal rate, regular rhythm and normal heart sounds.  No murmur heard. No BLE edema. Pulmonary/Chest: Effort normal and breath sounds normal. No respiratory distress. Abdominal: Soft. Bowel sounds are normal, no distension. There is no tenderness. No masses. Musculoskeletal: Normal range of motion, no joint effusions. No gross deformities.  There is mild tenderness to the medial elbow with negative tinel's to the ulnar and radial nerves. Strength +5/5, radial pulse +2/4.  Neurological: Pt is alert and oriented to person, place, and time. No cranial nerve deficit. Coordination, balance, strength, speech and gait are normal.  Skin: Skin is warm and dry. No rash noted. No erythema.  Psychiatric: Patient has a normal mood and affect. behavior is normal. Judgment and thought content normal.   No results found for this or any previous visit (from the past 72 hour(s)).  Assessment & Plan  1. Cubital tunnel syndrome on right - Avoid keeping arm bent or resting elbow - avoiding arm rests, etc. May wear elbow pad backwards on arm to prevent hyperflexion at night; NSAID use, Ice, Physical therapy. - naproxen (NAPROSYN) 500 MG tablet; Take 1 tablet (500 mg total) by mouth 2 (two) times daily with a meal.  Dispense: 30 tablet; Refill: 0 - Ambulatory referral to Physical Therapy  2. Need for influenza vaccination - Flu Vaccine QUAD 6+ mos PF IM (Fluarix Quad PF)

## 2017-10-12 NOTE — Patient Instructions (Addendum)
Cubital Tunnel Syndrome Cubital tunnel syndrome is a condition that causes pain and weakness of the forearm and hand. This condition happens when one of the nerves (ulnar nerve) that runs alongside the elbow joint becomes irritated. What are the causes? Causes of this condition include:  Increased pressure on the ulnar nerve at the elbow, arm, or forearm. This can be caused by: ? Swollen tissues. ? Ligaments. ? Muscles. ? Poorly healed elbow fractures. ? Tumors in the elbow. These are usually noncancerous (benign). ? Scar tissue that develops in the elbow after an injury. ? Bony growths (spurs) near the ulnar nerve.  Stretching of the nerve due to loose elbow ligaments.  Trauma to the nerve at the elbow.  Repetitive elbow bending.  Certain medical conditions.  What increases the risk? This condition is more likely to develop in:  People who do manual labor that requires frequently bending the elbow.  People who play sports that include repeated or strenuous throwing motions, such as baseball.  People who play contact sports, such as football or lacrosse.  People who do not warm up properly before activities.  People who have diabetes.  People who have an underactive thyroid (hypothyroidism).  What are the signs or symptoms? Symptoms of this condition include:  Clumsiness or weakness of the hand.  Tenderness of the inner elbow.  Aching or soreness of the inner elbow, forearm, or fingers, especially the little finger or the ring finger.  Increased pain with forced elbow bending.  Reduced control when throwing.  Tingling, numbness, or burning inside the forearm, or in part of the hand or fingers, especially the little finger or the ring finger.  Sharp pains that shoot from the elbow down to the wrist and hand.  The inability to grip or pinch hard.  How is this diagnosed? This condition is diagnosed with a medical history and physical exam. Your health care  provider will ask about your symptoms and ask for details about any injury. You may also have other tests, including:  Electromyogram (EMG). This test checks how well the nerve is working.  X-ray.  How is this treated? Treatment starts by stopping the activities that are causing your symptoms to get worse. Treatment may include the use of icing and medicines to reduce pain and swelling. You may also be advised to wear a splint to prevent your elbow from bending or wear an elbow pad where the ulnar nerve is closest to the skin. In less severe cases, treatment may also include working with a physical therapist:  To help decrease your symptoms.  To improve the strength and range of motion of your elbow, forearm, and hand.  If the treatments described above do not help, surgery may be needed. Follow these instructions at home: If you have a splint:  Wear it as told by your health care provider. Remove it only as told by your health care provider.  Loosen the splint if your fingers become numb and tingle, or if they turn cold and blue.  Keep the splint clean and dry. Managing pain, stiffness, and swelling  If directed, apply ice to the injured area: ? Put ice in a plastic bag. ? Place a towel between your skin and the bag. ? Leave the ice on for 20 minutes, 2-3 times per day.  Move your fingers often to avoid stiffness and to lessen swelling.  Raise (elevate) the injured area above the level of your heart while you are sitting or lying down. General   instructions  Take over-the-counter and prescription medicines only as told by your health care provider.  Keep all follow-up visits as told by your health care provider. This is important.  Do any exercise or physical therapy as told by your health care provider.  Do not drive or operate heavy machinery while taking prescription pain medicine.  If you were given an elbow pad, wear it as told by your health care provider. Contact a  health care provider if:  Your symptoms get worse.  Your symptoms do not get better with treatment.  Your have new pain.  Your hand on the injured side feels numb or cold. This information is not intended to replace advice given to you by your health care provider. Make sure you discuss any questions you have with your health care provider. Document Released: 01/09/2005 Document Revised: 06/17/2015 Document Reviewed: 03/18/2014 Elsevier Interactive Patient Education  2018 Elsevier Inc.  

## 2017-10-15 ENCOUNTER — Ambulatory Visit: Payer: Self-pay | Admitting: Family Medicine

## 2017-11-01 DIAGNOSIS — E349 Endocrine disorder, unspecified: Secondary | ICD-10-CM | POA: Diagnosis not present

## 2017-11-08 ENCOUNTER — Other Ambulatory Visit: Payer: Self-pay | Admitting: Family Medicine

## 2017-11-08 DIAGNOSIS — G5621 Lesion of ulnar nerve, right upper limb: Secondary | ICD-10-CM

## 2017-11-19 ENCOUNTER — Encounter: Payer: Self-pay | Admitting: Occupational Therapy

## 2017-11-19 ENCOUNTER — Ambulatory Visit: Payer: BLUE CROSS/BLUE SHIELD | Attending: Family Medicine | Admitting: Occupational Therapy

## 2017-11-19 ENCOUNTER — Other Ambulatory Visit: Payer: Self-pay

## 2017-11-19 DIAGNOSIS — M25521 Pain in right elbow: Secondary | ICD-10-CM | POA: Diagnosis not present

## 2017-11-19 DIAGNOSIS — M25621 Stiffness of right elbow, not elsewhere classified: Secondary | ICD-10-CM

## 2017-11-19 NOTE — Patient Instructions (Signed)
Heat to elbow and volar forearm Cross friction massage to medial epicondyle  And massage to volar forearm Stretches with elbow to side and supination - PROM for wrist flexors And then extented arm in supination - PROM for wrist flexors  10 reps hold 5 sec  ice massage   Avoid repetitive grip or wrist flexion - during workouts and using hand at work and home

## 2017-11-19 NOTE — Therapy (Signed)
[facility] [address], [place], [address] Phone: [phone]   Fax:  [phone]  Occupational Therapy Treatment  Patient Details  Name: Seth Meadows MRN: [id number] Date of Birth: 1960-07-18 Referring Provider (OT): Raelyn Ensign   Encounter Date: 11/19/2017  OT End of Session - 11/19/17 1732    Visit Number  1    Number of Visits  6    Date for OT Re-Evaluation  12/31/17    OT Start Time  1300    OT Stop Time  1353    OT Time Calculation (min)  53 min    Activity Tolerance  Patient tolerated treatment well    Behavior During Therapy  [facility] for tasks assessed/performed       Past Medical History:  Diagnosis Date  . Allergic rhinitis   . BPH with obstruction/lower urinary tract symptoms   . Cutaneous eruption   . Earache   . Epididymitis, left   . Hyperglycemia   . Hypertension   . Hypogonadism in male   . Kidney function test abnormal   . Overweight   . Scrotal pain   . Sleep apnea     Past Surgical History:  Procedure Laterality Date  . EYELID LACERATION REPAIR  01/2017   cut the the eye lid to drain the infection out  . WISDOM TOOTH EXTRACTION      There were no vitals filed for this visit.  Subjective Assessment - 11/19/17 1410    Subjective   I had elbow pain since early June - it is okay at times - but then tender and certain way i move my hand , elbow - or like washing hands or reach at work and the pain can increase to 8-9/10 pain     Pertinent History  R medial elbow pain - Since June 19 - seen Elon PA and then NP at MD office - done some ice , and ibruprofen or naproxen - but it still bothers him     Patient Stated Goals  I just want the pain better - so I can cont lifting weights in gym and play volleyball - and use my R dominant arm     Currently in Pain?  Yes    Pain Score  4    can increase to 8-9/10 pain shooting pain    Pain Location  Elbow    Pain Orientation  Right    Pain Descriptors / Indicators  Shooting;Aching    Pain Type  Acute pain    Pain Onset  More than a month ago    Pain Frequency  Intermittent         [facility] OT Assessment - 11/19/17 0001      Assessment   Medical Diagnosis  R medial epicondylitis    Referring Provider (OT)  Raelyn Ensign    Onset Date/Surgical Date  06/23/17    Hand Dominance  Right      Home  Environment   Lives With  Family      Prior Function   Vocation  Full time employment    Leisure  Engineer- some on computer, work out with [employer] in gym , volleyball , some yard work       Pharmacist, community Grip (lbs)  130   extended arm 140   Right Hand Lateral Pinch  30 lbs    Right Hand 3 Point Pinch  30 lbs  Left Hand Grip (lbs)  120   extended arm 125   Left Hand Lateral Pinch  30 lbs    Left Hand 3 Point Pinch  27 lbs         Heat to elbow and volar forearm prior to review of HEP   done some graston tool nr 2 for sweeping over volar forearm and wrist   as well as  Cross friction massage to medial epicondyle  And massage to volar forearm - pt need to do  Stretches with elbow to side and supination - PROM for wrist flexors And then extented arm in supination - PROM for wrist flexors  10 reps hold 5 sec  ice massage   Avoid repetitive grip or wrist flexion - during workouts and using hand at work and home                OT Education - 11/19/17 1732    Education Details  findings , HEP     Person(s) Educated  Patient    Methods  Explanation;Demonstration;Handout    Comprehension  Verbalized understanding;Returned demonstration       OT Short Term Goals - 11/19/17 1814      OT SHORT TERM GOAL #1   Title  Pain decrease on PREE to less than 5/50     Baseline  At eval pain on PREE 12/50 and at the worse 9/10     Time  2    Period  Weeks    Status  New    Target Date  12/10/17      OT SHORT TERM GOAL #2   Title  Pt to be independent in HEP to decrease pain and flexibility      Baseline  very little knowledge on HEP     Time  2    Period  Weeks    Status  New    Target Date  12/10/17        OT Long Term Goals - 11/19/17 1817      OT LONG TERM GOAL #1   Title  Pt pain and symptoms decrease to less than 2-3/10 pain at the worse with use during day     Baseline  pain at the worse 9/10     Time  6    Period  Weeks    Status  New    Target Date  12/31/17      OT LONG TERM GOAL #2   Title  Pt to have knowledge and demo understanding to modify work , home  and sports act to decrease symptoms     Baseline  very little knowledge     Time  6    Period  Weeks    Status  New    Target Date  12/31/17            Plan - 11/19/17 1733    Clinical Impression Statement  Pt present at OT eval with L medial epicondyle - tender and pain with wrist flexion resistance and grip - not tender over Cubital tunnel , and deniesnumbness- negative for Tinel - pt grip strength and AROM appear WNL - but some tigithness with trigger point over volar wrist and forearm - pt can benefit from OT serrvices to decrease pain    Occupational performance deficits (Please refer to evaluation for details):  IADL's;Work;Play;Leisure    Rehab Potential  Good    Current Impairments/barriers affecting progress:  had pain since June  OT Frequency  --   2 x wk for 2 wks , then 1x to biweekly for 4 wks    OT Duration  6 weeks    OT Treatment/Interventions  Patient/family education;Therapeutic exercise;Self-care/ADL training;Iontophoresis;Moist Heat;Ultrasound;Cryotherapy;Manual Therapy;Passive range of motion    Plan  Reassess progress with HEP -and initiate Ionto wiht dexamethazone     Clinical Decision Making  Limited treatment options, no task modification necessary    OT Home Exercise Plan  see pt instruction     Consulted and Agree with Plan of Care  Patient       Patient will benefit from skilled therapeutic intervention in order to improve the following deficits and  impairments:  Pain, Impaired UE functional use, Decreased knowledge of precautions, Decreased range of motion  Visit Diagnosis: Pain in right elbow  Stiffness of right elbow, not elsewhere classified    Problem List Patient Active Problem List   Diagnosis Date Noted  . Need for hepatitis C screening test 07/12/2016  . Generalized abdominal pain 03/24/2016  . Gastroesophageal reflux disease without esophagitis 03/01/2016  . OSA on CPAP 02/28/2016  . Vitamin D insufficiency 08/31/2015  . BPH with obstruction/lower urinary tract symptoms 04/30/2015  . Annual physical exam 03/16/2015  . Dyslipidemia 03/01/2015  . Erectile dysfunction of organic origin 10/29/2014  . Arthritis of lumbar spine 10/22/2014  . Right hip pain 09/21/2014  . Chronic mid back pain 09/21/2014  . Right thigh pain 08/20/2014  . Allergic rhinitis 08/17/2014  . Blood glucose elevated 08/17/2014  . Abnormal kidney function study 08/17/2014  . Hypogonadism in male 07/30/2014  . Sebaceous cyst 07/21/2014  . Failure of erection 07/21/2014  . Low testosterone 07/21/2014  . Low back strain 07/21/2014  . Hypertension     Rosalyn Gess OTR/L,CLT 11/19/2017, 6:21 PM  [facility] [address], [place], [address] Phone: [phone]   Fax:  [phone]  Name: Gery Sabedra MRN: [id number] Date of Birth: Jul 21, 1960

## 2017-11-22 DIAGNOSIS — G4733 Obstructive sleep apnea (adult) (pediatric): Secondary | ICD-10-CM | POA: Diagnosis not present

## 2017-11-28 ENCOUNTER — Ambulatory Visit: Payer: BLUE CROSS/BLUE SHIELD | Attending: Family Medicine | Admitting: Occupational Therapy

## 2017-11-28 DIAGNOSIS — M25521 Pain in right elbow: Secondary | ICD-10-CM | POA: Diagnosis not present

## 2017-11-28 DIAGNOSIS — M25621 Stiffness of right elbow, not elsewhere classified: Secondary | ICD-10-CM | POA: Diagnosis not present

## 2017-11-28 NOTE — Patient Instructions (Signed)
Decrease stretches to only AROM with extended arm in sup and neutral and wrist extention  And only 5 reps and hold 5 sec   And rest of HEP same

## 2017-11-28 NOTE — Therapy (Signed)
[facility] [address], [place], [address] Phone: [phone]   Fax:  [phone]  Occupational Therapy Treatment  Patient Details  Name: Seth Meadows MRN: [id number] Date of Birth: [birth year] Referring Provider (OT): Raelyn Ensign   Encounter Date: 11/28/2017  OT End of Session - 11/28/17 1734    Visit Number  2    Number of Visits  6    Date for OT Re-Evaluation  12/31/17    OT Start Time  7564    OT Stop Time  1345    OT Time Calculation (min)  40 min    Activity Tolerance  Patient tolerated treatment well    Behavior During Therapy  [facility] for tasks assessed/performed       Past Medical History:  Diagnosis Date  . Allergic rhinitis   . BPH with obstruction/lower urinary tract symptoms   . Cutaneous eruption   . Earache   . Epididymitis, left   . Hyperglycemia   . Hypertension   . Hypogonadism in male   . Kidney function test abnormal   . Overweight   . Scrotal pain   . Sleep apnea     Past Surgical History:  Procedure Laterality Date  . EYELID LACERATION REPAIR  01/2017   cut the the eye lid to drain the infection out  . WISDOM TOOTH EXTRACTION      There were no vitals filed for this visit.  Subjective Assessment - 11/28/17 1730    Subjective   I was in [place] - I did the stretches but after day - my elbow and arm was more tender - so I stopped the stretches and now it feels about the same as it did last time - not as tender but with pressure against my fingers in the am  - some pain     Patient Stated Goals  I just want the pain better - so I can cont lifting weights in gym and play volleyball - and use my R dominant arm     Pain Score  2     Pain Location  Elbow    Pain Orientation  Right    Pain Descriptors / Indicators  Aching;Stabbing    Pain Onset  More than a month ago    Pain Frequency  Occasional       Assess pt very minimal tender over med epicondyle  With AROM  stretch for flexors of forearm - pt had stretch over volar proximal forearm - but after heat not stretch  Change pt stretches to extended arm -in sup and neutral - AROM stretch  And change to only 5 sec hold , 5 reps              OT Treatments/Exercises (OP) - 11/28/17 0001      Moist Heat Therapy   Moist Heat Location  --   forearm and elbow     Iontophoresis   Type of Iontophoresis  Dexamethasone    Location  med epicondyle    Dose  med patch 2.0 current ,    Time  19      after heat done some Graston tool nr 2 sweeping over volar and dorsal wrist and foream  And criss friction at med epicondyle   Pt ed on ionto use -and what to expect  skin check done prior to afterwards  Tolerated well   Review again - what to avoid during workout in gym  OT Education - 11/28/17 1734    Education Details  HEP modiifications and ionto     Person(s) Educated  Patient    Methods  Explanation;Demonstration;Handout    Comprehension  Verbalized understanding;Returned demonstration       OT Short Term Goals - 11/19/17 1814      OT SHORT TERM GOAL #1   Title  Pain decrease on PREE to less than 5/50     Baseline  At eval pain on PREE 12/50 and at the worse 9/10     Time  2    Period  Weeks    Status  New    Target Date  12/10/17      OT SHORT TERM GOAL #2   Title  Pt to be independent in HEP to decrease pain and flexibility     Baseline  very little knowledge on HEP     Time  2    Period  Weeks    Status  New    Target Date  12/10/17        OT Long Term Goals - 11/19/17 1817      OT LONG TERM GOAL #1   Title  Pt pain and symptoms decrease to less than 2-3/10 pain at the worse with use during day     Baseline  pain at the worse 9/10     Time  6    Period  Weeks    Status  New    Target Date  12/31/17      OT LONG TERM GOAL #2   Title  Pt to have knowledge and demo understanding to modify work , home  and sports act to decrease symptoms     Baseline   very little knowledge     Time  6    Period  Weeks    Status  New    Target Date  12/31/17            Plan - 11/28/17 1735    Clinical Impression Statement   Pt report he had increase pain after doing stretches - modify stretches to AROM only and hold 5 sec - and this date intiated ionto with dexamethazone    Occupational performance deficits (Please refer to evaluation for details):  IADL's;Work;Play;Leisure    Rehab Potential  Good    Current Impairments/barriers affecting progress:  had pain since June     OT Frequency  --   2 x wk for 2 wks and 1x or biweekly for 4 wks    OT Duration  6 weeks    OT Treatment/Interventions  Patient/family education;Therapeutic exercise;Self-care/ADL training;Iontophoresis;Moist Heat;Ultrasound;Cryotherapy;Manual Therapy;Passive range of motion    Plan  Reassess progress with HEP -and 2nd session Ionto wiht dexamethazone     Clinical Decision Making  Limited treatment options, no task modification necessary    OT Home Exercise Plan  see pt instruction     Consulted and Agree with Plan of Care  Patient       Patient will benefit from skilled therapeutic intervention in order to improve the following deficits and impairments:  Pain, Impaired UE functional use, Decreased knowledge of precautions, Decreased range of motion  Visit Diagnosis: Pain in right elbow  Stiffness of right elbow, not elsewhere classified    Problem List Patient Active Problem List   Diagnosis Date Noted  . Need for hepatitis C screening test 07/12/2016  . Generalized abdominal pain 03/24/2016  . Gastroesophageal reflux disease without esophagitis 03/01/2016  .  OSA on CPAP 02/28/2016  . Vitamin D insufficiency 08/31/2015  . BPH with obstruction/lower urinary tract symptoms 04/30/2015  . Annual physical exam 03/16/2015  . Dyslipidemia 03/01/2015  . Erectile dysfunction of organic origin 10/29/2014  . Arthritis of lumbar spine 10/22/2014  . Right hip pain  09/21/2014  . Chronic mid back pain 09/21/2014  . Right thigh pain 08/20/2014  . Allergic rhinitis 08/17/2014  . Blood glucose elevated 08/17/2014  . Abnormal kidney function study 08/17/2014  . Hypogonadism in male 07/30/2014  . Sebaceous cyst 07/21/2014  . Failure of erection 07/21/2014  . Low testosterone 07/21/2014  . Low back strain 07/21/2014  . Hypertension     Rosalyn Gess OTR/L,CLT 11/28/2017, 5:37 PM  [facility] [address] [place], [address] Phone: [phone]   Fax:  [phone]  Name: Jahlil Ziller MRN: [id number] Date of Birth: 08/25/1960

## 2017-11-30 ENCOUNTER — Ambulatory Visit: Payer: BLUE CROSS/BLUE SHIELD | Admitting: Occupational Therapy

## 2017-11-30 DIAGNOSIS — M25621 Stiffness of right elbow, not elsewhere classified: Secondary | ICD-10-CM | POA: Diagnosis not present

## 2017-11-30 DIAGNOSIS — M25521 Pain in right elbow: Secondary | ICD-10-CM

## 2017-11-30 NOTE — Patient Instructions (Signed)
same

## 2017-11-30 NOTE — Therapy (Signed)
[facility] [address] [place], [address] Phone: [phone]   Fax:  [phone]  Occupational Therapy Treatment  Patient Details  Name: Seth Meadows MRN: [id number] Date of Birth: 08-27-1960 Referring Provider (OT): Raelyn Ensign   Encounter Date: 11/30/2017  OT End of Session - 11/30/17 1433    Visit Number  3    Date for OT Re-Evaluation  12/31/17    OT Start Time  1208    OT Stop Time  1246    OT Time Calculation (min)  38 min    Activity Tolerance  Patient tolerated treatment well    Behavior During Therapy  [facility] for tasks assessed/performed       Past Medical History:  Diagnosis Date  . Allergic rhinitis   . BPH with obstruction/lower urinary tract symptoms   . Cutaneous eruption   . Earache   . Epididymitis, left   . Hyperglycemia   . Hypertension   . Hypogonadism in male   . Kidney function test abnormal   . Overweight   . Scrotal pain   . Sleep apnea     Past Surgical History:  Procedure Laterality Date  . EYELID LACERATION REPAIR  01/2017   cut the the eye lid to drain the infection out  . WISDOM TOOTH EXTRACTION      There were no vitals filed for this visit.  Subjective Assessment - 11/30/17 1428    Subjective   Feeling better with the stretches changes - and maybe want to say the pain little better - did work out some but did not do any bicep curls    Patient Stated Goals  I just want the pain better - so I can cont lifting weights in gym and play volleyball - and use my R dominant arm     Currently in Pain?  No/denies       Cont to be tender at medial epicondyle - but less pain with pressure agains flexors of forearm  Active stretches better per pt - and not increase pain             OT Treatments/Exercises (OP) - 11/30/17 0001      Moist Heat Therapy   Moist Heat Location  --   prior to soft tissue mobs      Iontophoresis   Type of Iontophoresis   Dexamethasone    Location  med epicondyle    Dose  med patch 2.0 current ,    Time  19   but had to stop last 5 min - increase stinging - 2 small bli      Soft tissue mobs done with graston tool nr 2 and 4 for sweeping over volar forearm and wrist - tight on proximal flexors And AROM for wrist flexor stretches - 5 sec hold and 5 reps       OT Education - 11/30/17 1433    Education Details  HEP modiifications and ionto     Person(s) Educated  Patient    Methods  Explanation;Demonstration;Handout    Comprehension  Verbalized understanding;Returned demonstration       OT Short Term Goals - 11/19/17 1814      OT SHORT TERM GOAL #1   Title  Pain decrease on PREE to less than 5/50     Baseline  At eval pain on PREE 12/50 and at the worse 9/10     Time  2    Period  Weeks    Status  New    Target Date  12/10/17      OT SHORT TERM GOAL #2   Title  Pt to be independent in HEP to decrease pain and flexibility     Baseline  very little knowledge on HEP     Time  2    Period  Weeks    Status  New    Target Date  12/10/17        OT Long Term Goals - 11/19/17 1817      OT LONG TERM GOAL #1   Title  Pt pain and symptoms decrease to less than 2-3/10 pain at the worse with use during day     Baseline  pain at the worse 9/10     Time  6    Period  Weeks    Status  New    Target Date  12/31/17      OT LONG TERM GOAL #2   Title  Pt to have knowledge and demo understanding to modify work , home  and sports act to decrease symptoms     Baseline  very little knowledge     Time  6    Period  Weeks    Status  New    Target Date  12/31/17            Plan - 11/30/17 1434    Clinical Impression Statement  Pt report that changes into Active stretches better- not as much pain this am - but still tender at medial epicodyle - cont with same HEP - pt had increase sensitivity last 5 min of ionto - stopped - pt had 2 small blisters     Occupational performance deficits (Please  refer to evaluation for details):  IADL's;Work;Play;Leisure    Rehab Potential  Good    Current Impairments/barriers affecting progress:  had pain since June     OT Frequency  --   2 x wks for 2 wks then 1 x wk   OT Duration  6 weeks    OT Treatment/Interventions  Patient/family education;Therapeutic exercise;Self-care/ADL training;Iontophoresis;Moist Heat;Ultrasound;Cryotherapy;Manual Therapy;Passive range of motion    Plan  Reassess progress with HEP -and 3rd session Ionto wiht dexamethazone     Clinical Decision Making  Limited treatment options, no task modification necessary    OT Home Exercise Plan  see pt instruction     Consulted and Agree with Plan of Care  Patient       Patient will benefit from skilled therapeutic intervention in order to improve the following deficits and impairments:  Pain, Impaired UE functional use, Decreased knowledge of precautions, Decreased range of motion  Visit Diagnosis: Pain in right elbow  Stiffness of right elbow, not elsewhere classified    Problem List Patient Active Problem List   Diagnosis Date Noted  . Need for hepatitis C screening test 07/12/2016  . Generalized abdominal pain 03/24/2016  . Gastroesophageal reflux disease without esophagitis 03/01/2016  . OSA on CPAP 02/28/2016  . Vitamin D insufficiency 08/31/2015  . BPH with obstruction/lower urinary tract symptoms 04/30/2015  . Annual physical exam 03/16/2015  . Dyslipidemia 03/01/2015  . Erectile dysfunction of organic origin 10/29/2014  . Arthritis of lumbar spine 10/22/2014  . Right hip pain 09/21/2014  . Chronic mid back pain 09/21/2014  . Right thigh pain 08/20/2014  . Allergic rhinitis 08/17/2014  . Blood glucose elevated 08/17/2014  . Abnormal kidney function study 08/17/2014  . Hypogonadism in male 07/30/2014  .  Sebaceous cyst 07/21/2014  . Failure of erection 07/21/2014  . Low testosterone 07/21/2014  . Low back strain 07/21/2014  . Hypertension     Rosalyn Gess OTR/L,CLT 11/30/2017, 2:48 PM  [facility] [address], [place], [address] Phone: [phone]   Fax:  [phone]  Name: Seth Meadows MRN: [id number] Date of Birth: 11-25-1960

## 2017-12-04 ENCOUNTER — Ambulatory Visit: Payer: BLUE CROSS/BLUE SHIELD | Admitting: Occupational Therapy

## 2017-12-04 DIAGNOSIS — M25621 Stiffness of right elbow, not elsewhere classified: Secondary | ICD-10-CM

## 2017-12-04 DIAGNOSIS — M25521 Pain in right elbow: Secondary | ICD-10-CM

## 2017-12-04 NOTE — Patient Instructions (Signed)
Cont with AROM stretches to flexors of forearm - 5 reps   hold 5 sec  And keep ionto patch on for our after leaving

## 2017-12-04 NOTE — Therapy (Signed)
[place] [facility] [address] [place], [address] Phone: [phone]   Fax:  [phone]  Occupational Therapy Treatment  Patient Details  Name: Seth Meadows MRN: [id number] Date of Birth: 02-14-1960 Referring Provider (OT): Raelyn Ensign   Encounter Date: 12/04/2017  OT End of Session - 12/04/17 1354    Visit Number  4    Number of Visits  6    Date for OT Re-Evaluation  12/31/17    OT Start Time  1132    OT Stop Time  1217    OT Time Calculation (min)  45 min    Activity Tolerance  Patient tolerated treatment well    Behavior During Therapy  [facility] for tasks assessed/performed       Past Medical History:  Diagnosis Date  . Allergic rhinitis   . BPH with obstruction/lower urinary tract symptoms   . Cutaneous eruption   . Earache   . Epididymitis, left   . Hyperglycemia   . Hypertension   . Hypogonadism in male   . Kidney function test abnormal   . Overweight   . Scrotal pain   . Sleep apnea     Past Surgical History:  Procedure Laterality Date  . EYELID LACERATION REPAIR  01/2017   cut the the eye lid to drain the infection out  . WISDOM TOOTH EXTRACTION      There were no vitals filed for this visit.  Subjective Assessment - 12/04/17 1133    Subjective   I would say it is better - when I press my fingers against each other - pain is better - my elbow is  little tender at that bone  - but I think it is getting better     Patient Stated Goals  I just want the pain better - so I can cont lifting weights in gym and play volleyball - and use my R dominant arm     Currently in Pain?  No/denies          Cont to be tender at medial epicondyle - but no pain with pressure agains flexors of forearm  And AROM stretches Active stretches better per pt - and not increase pain            OT Treatments/Exercises (OP) - 12/04/17 0001      Moist Heat Therapy   Moist Heat Location  --   to forearm  prior to manual therapy     Iontophoresis   Type of Iontophoresis  Dexamethasone    Location  med epicondyle    Dose  med patch 2.0 current ,    Time  19      pt still had little scab - cut slid into edge of patch to go around scab - tolerate ionto then with no issues     Soft tissue mobs done with graston tool nr 2 and 4 for sweeping over volar forearm and wrist - tight on proximal flexors -  Pt ed on modifications - light grip , not over grip, or tight grip - avoid grip with wrist flexion  And AROM for wrist flexor stretches - 5 sec hold and 5 reps in supine and neutral       OT Education - 12/04/17 1354    Education Details  HEP modiifications  - avoid tight and sustained grip - over gripping or grip with wrist flexion and ionto     Person(s) Educated  Patient  Methods  Explanation;Demonstration    Comprehension  Verbalized understanding;Returned demonstration       OT Short Term Goals - 11/19/17 1814      OT SHORT TERM GOAL #1   Title  Pain decrease on PREE to less than 5/50     Baseline  At eval pain on PREE 12/50 and at the worse 9/10     Time  2    Period  Weeks    Status  New    Target Date  12/10/17      OT SHORT TERM GOAL #2   Title  Pt to be independent in HEP to decrease pain and flexibility     Baseline  very little knowledge on HEP     Time  2    Period  Weeks    Status  New    Target Date  12/10/17        OT Long Term Goals - 11/19/17 1817      OT LONG TERM GOAL #1   Title  Pt pain and symptoms decrease to less than 2-3/10 pain at the worse with use during day     Baseline  pain at the worse 9/10     Time  6    Period  Weeks    Status  New    Target Date  12/31/17      OT LONG TERM GOAL #2   Title  Pt to have knowledge and demo understanding to modify work , home  and sports act to decrease symptoms     Baseline  very little knowledge     Time  6    Period  Weeks    Status  New    Target Date  12/31/17            Plan -  12/04/17 1355    Clinical Impression Statement  Pt report less pain with pressure against flexors of forearm and digits - tender at medial epicondyle - tolerate ionto good this date - did cut slit in patch edge to go around tiny scab - pt had 4th session of ionto this date     Occupational performance deficits (Please refer to evaluation for details):  IADL's;Work;Play;Leisure    Rehab Potential  Good    Current Impairments/barriers affecting progress:  had pain since June     OT Frequency  --   2 x wk and then 1 x wk   OT Duration  4 weeks    OT Treatment/Interventions  Patient/family education;Therapeutic exercise;Self-care/ADL training;Iontophoresis;Moist Heat;Ultrasound;Cryotherapy;Manual Therapy;Passive range of motion    Plan  Reassess progress with HEP and pain  -and 4th session Ionto wiht dexamethazone     Clinical Decision Making  Limited treatment options, no task modification necessary    OT Home Exercise Plan  see pt instruction     Consulted and Agree with Plan of Care  Patient       Patient will benefit from skilled therapeutic intervention in order to improve the following deficits and impairments:  Pain, Impaired UE functional use, Decreased knowledge of precautions, Decreased range of motion  Visit Diagnosis: Pain in right elbow  Stiffness of right elbow, not elsewhere classified    Problem List Patient Active Problem List   Diagnosis Date Noted  . Need for hepatitis C screening test 07/12/2016  . Generalized abdominal pain 03/24/2016  . Gastroesophageal reflux disease without esophagitis 03/01/2016  . OSA on CPAP 02/28/2016  . Vitamin D insufficiency 08/31/2015  .  BPH with obstruction/lower urinary tract symptoms 04/30/2015  . Annual physical exam 03/16/2015  . Dyslipidemia 03/01/2015  . Erectile dysfunction of organic origin 10/29/2014  . Arthritis of lumbar spine 10/22/2014  . Right hip pain 09/21/2014  . Chronic mid back pain 09/21/2014  . Right thigh pain  08/20/2014  . Allergic rhinitis 08/17/2014  . Blood glucose elevated 08/17/2014  . Abnormal kidney function study 08/17/2014  . Hypogonadism in male 07/30/2014  . Sebaceous cyst 07/21/2014  . Failure of erection 07/21/2014  . Low testosterone 07/21/2014  . Low back strain 07/21/2014  . Hypertension     Rosalyn Gess OTR/L,CLT 12/04/2017, 1:58 PM  [facility] [address] [place], [address] Phone: [phone]   Fax:  [phone]  Name: Seth Meadows MRN: [id number] Date of Birth: 05-23-1960

## 2017-12-07 ENCOUNTER — Ambulatory Visit: Payer: BLUE CROSS/BLUE SHIELD | Admitting: Occupational Therapy

## 2017-12-07 DIAGNOSIS — M25521 Pain in right elbow: Secondary | ICD-10-CM

## 2017-12-07 DIAGNOSIS — M25621 Stiffness of right elbow, not elsewhere classified: Secondary | ICD-10-CM | POA: Diagnosis not present

## 2017-12-07 NOTE — Patient Instructions (Signed)
Do AROM stretch  If pain free   can do now extended arm in neutral and supination 5 reps  5 x

## 2017-12-07 NOTE — Therapy (Signed)
[facility] [address] [place], [address] Phone: [phone]   Fax:  [phone]  Occupational Therapy Treatment  Patient Details  Name: Seth Meadows MRN: [id number] Date of Birth: 1960/09/11 Referring Provider (OT): Raelyn Ensign   Encounter Date: 12/07/2017  OT End of Session - 12/07/17 1301    Visit Number  5    Number of Visits  6    Date for OT Re-Evaluation  12/31/17    OT Start Time  9509    OT Stop Time  1247    OT Time Calculation (min)  42 min    Activity Tolerance  Patient tolerated treatment well    Behavior During Therapy  [facility] for tasks assessed/performed       Past Medical History:  Diagnosis Date  . Allergic rhinitis   . BPH with obstruction/lower urinary tract symptoms   . Cutaneous eruption   . Earache   . Epididymitis, left   . Hyperglycemia   . Hypertension   . Hypogonadism in male   . Kidney function test abnormal   . Overweight   . Scrotal pain   . Sleep apnea     Past Surgical History:  Procedure Laterality Date  . EYELID LACERATION REPAIR  01/2017   cut the the eye lid to drain the infection out  . WISDOM TOOTH EXTRACTION      There were no vitals filed for this visit.  Subjective Assessment - 12/07/17 1253    Subjective   I would say it is better - when I press my fingers against each other - no pain but did feel it when I did pull the door at certain angle - it was bout 4/10     Patient Stated Goals  I just want the pain better - so I can cont lifting weights in gym and play volleyball - and use my R dominant arm     Currently in Pain?  No/denies      no pain with AROM stretches to forearm flexors - add PROM stretches with forearm in sup and neutral As well as no pain with grip - but resistance with grip and wrist flexion - pain 3/10 -and end  Range sup and pronation with resistance some discomfort  Tenderness better           after moist heat done    Soft tissue mobs done with graston tool nr 2 and 4 for sweeping over volar forearm and wrist - tight on proximal flexors -  Pt ed on modifications - light grip , not over grip, or tight grip - avoid grip with wrist flexion   OT Treatments/Exercises (OP) - 12/07/17 0001      Moist Heat Therapy   Moist Heat Location  --   heatingpad to forearm and elbow prior to manual     Iontophoresis   Type of Iontophoresis  Dexamethasone    Location  med epicondyle    Dose  med patch 2.0 current ,    Time  19      skin check done prior - did cut slit in again for little pin size area that could be scab - was able to tolerate ionto again with no issues - to keep patch on for hour        OT Education - 12/07/17 1256    Education Details  HEP modiifications  - avoid tight and sustained grip - over gripping or grip  with wrist flexion and ionto sessions    Person(s) Educated  Patient    Methods  Explanation;Demonstration    Comprehension  Verbalized understanding;Returned demonstration       OT Short Term Goals - 11/19/17 1814      OT SHORT TERM GOAL #1   Title  Pain decrease on PREE to less than 5/50     Baseline  At eval pain on PREE 12/50 and at the worse 9/10     Time  2    Period  Weeks    Status  New    Target Date  12/10/17      OT SHORT TERM GOAL #2   Title  Pt to be independent in HEP to decrease pain and flexibility     Baseline  very little knowledge on HEP     Time  2    Period  Weeks    Status  New    Target Date  12/10/17        OT Long Term Goals - 11/19/17 1817      OT LONG TERM GOAL #1   Title  Pt pain and symptoms decrease to less than 2-3/10 pain at the worse with use during day     Baseline  pain at the worse 9/10     Time  6    Period  Weeks    Status  New    Target Date  12/31/17      OT LONG TERM GOAL #2   Title  Pt to have knowledge and demo understanding to modify work , home  and sports act to decrease symptoms     Baseline  very little knowledge      Time  6    Period  Weeks    Status  New    Target Date  12/31/17            Plan - 12/07/17 1302    Clinical Impression Statement  Pt report that pain improving - did still had pain with sup/pro end range with resistance and wrist fflexion with grip - 3-4 /10 pain - pt  to cont to modify act - did add this date PROM stretch with extended arm for wrist flexors    Occupational performance deficits (Please refer to evaluation for details):  IADL's;Work;Play;Leisure    Rehab Potential  Good    Current Impairments/barriers affecting progress:  had pain since June     OT Frequency  2x / week    OT Duration  2 weeks    OT Treatment/Interventions  Patient/family education;Therapeutic exercise;Self-care/ADL training;Iontophoresis;Moist Heat;Ultrasound;Cryotherapy;Manual Therapy;Passive range of motion    Plan  assess progress with passive stretches and resistance to fist with wrist fleixon     Clinical Decision Making  Limited treatment options, no task modification necessary    OT Home Exercise Plan  see pt instruction     Consulted and Agree with Plan of Care  Patient       Patient will benefit from skilled therapeutic intervention in order to improve the following deficits and impairments:  Pain, Impaired UE functional use, Decreased knowledge of precautions, Decreased range of motion  Visit Diagnosis: Pain in right elbow  Stiffness of right elbow, not elsewhere classified    Problem List Patient Active Problem List   Diagnosis Date Noted  . Need for hepatitis C screening test 07/12/2016  . Generalized abdominal pain 03/24/2016  . Gastroesophageal reflux disease without esophagitis 03/01/2016  . OSA on  CPAP 02/28/2016  . Vitamin D insufficiency 08/31/2015  . BPH with obstruction/lower urinary tract symptoms 04/30/2015  . Annual physical exam 03/16/2015  . Dyslipidemia 03/01/2015  . Erectile dysfunction of organic origin 10/29/2014  . Arthritis of lumbar spine 10/22/2014   . Right hip pain 09/21/2014  . Chronic mid back pain 09/21/2014  . Right thigh pain 08/20/2014  . Allergic rhinitis 08/17/2014  . Blood glucose elevated 08/17/2014  . Abnormal kidney function study 08/17/2014  . Hypogonadism in male 07/30/2014  . Sebaceous cyst 07/21/2014  . Failure of erection 07/21/2014  . Low testosterone 07/21/2014  . Low back strain 07/21/2014  . Hypertension     Rosalyn Gess OTR/L,CLT 12/07/2017, 1:14 PM  [facility] [facility] [address] [place], [address] Phone: [phone]   Fax:  [phone]  Name: Seth Meadows MRN: [id number] Date of Birth: 1960/03/05

## 2017-12-11 ENCOUNTER — Ambulatory Visit: Payer: BLUE CROSS/BLUE SHIELD | Admitting: Occupational Therapy

## 2017-12-11 DIAGNOSIS — M25521 Pain in right elbow: Secondary | ICD-10-CM | POA: Diagnosis not present

## 2017-12-11 DIAGNOSIS — M25621 Stiffness of right elbow, not elsewhere classified: Secondary | ICD-10-CM | POA: Diagnosis not present

## 2017-12-11 NOTE — Patient Instructions (Signed)
Same stretches and massage   but hold off on any UB strength at gym - still was doing some upper body machines - that involve gripping  Pt to avoid tight , sustained or repetitive gripping

## 2017-12-11 NOTE — Therapy (Signed)
[facility] [address], [place], [address] Phone: [phone]   Fax:  [phone]  Occupational Therapy Treatment  Patient Details  Name: Seth Meadows MRN: [id number] Date of Birth: [birth year] Referring Provider (OT): Raelyn Ensign   Encounter Date: 12/11/2017  OT End of Session - 12/11/17 1252    Visit Number  6    Number of Visits  6    Date for OT Re-Evaluation  12/31/17    OT Start Time  1103    OT Stop Time  1154    OT Time Calculation (min)  51 min    Activity Tolerance  Patient tolerated treatment well    Behavior During Therapy  [facility] for tasks assessed/performed       Past Medical History:  Diagnosis Date  . Allergic rhinitis   . BPH with obstruction/lower urinary tract symptoms   . Cutaneous eruption   . Earache   . Epididymitis, left   . Hyperglycemia   . Hypertension   . Hypogonadism in male   . Kidney function test abnormal   . Overweight   . Scrotal pain   . Sleep apnea     Past Surgical History:  Procedure Laterality Date  . EYELID LACERATION REPAIR  01/2017   cut the the eye lid to drain the infection out  . WISDOM TOOTH EXTRACTION      There were no vitals filed for this visit.  Subjective Assessment - 12/11/17 1248    Subjective   I still have some pain with pushing or resistance when picking up something with pressure against my fingers - in the am or when lifting something with fingers - 2-3/10 - still tender     Patient Stated Goals  I just want the pain better - so I can cont lifting weights in gym and play volleyball - and use my R dominant arm     Currently in Pain?  Yes    Pain Score  7     Pain Location  Elbow    Pain Orientation  Right    Pain Descriptors / Indicators  Tender    Pain Type  Acute pain    Pain Onset  More than a month ago    Pain Frequency  Occasional    Aggravating Factors   grip and resistance against FDS- more pain with resistance to grip -  than wrist flexion        no pain with AROM stretches to forearm flexors - cont with PROM stretches with forearm in sup and neutral Had  pain with  Resistance in grip more than resistance to wrist flexion -  Pain can increase to 7/10 at the worse with OT resistance  Tender still at medial epicondyle      Pt to hold off on working out with machines UB strengthening - pt was still doing some of the machines but kept wrist straight   but pt using FDS and pain with gripping   avoid tight , sustained and repetitive grip         Soft tissue mobs done with graston tool nr 2 and 4 for sweeping over volar forearm and wrist - tight on proximal flexors-     OT Treatments/Exercises (OP) - 12/11/17 0001      Moist Heat Therapy   Moist Heat Location  --   forearm and elbow R - prior to soft tissue mobs     Iontophoresis  Type of Iontophoresis  Dexamethasone    Location  med epicondyle    Dose  med patch 2.0 current ,    Time  19        skin check done prior - did cut slit in again for little pin size area that could be scab - was able to tolerate ionto again with no issues - to keep patch on for hour        OT Education - 12/11/17 1252    Education Details  HEP modiifications  - avoid tight and sustained grip - over gripping or grip with  and without wrist flexion     Person(s) Educated  Patient    Methods  Explanation;Demonstration    Comprehension  Verbalized understanding;Returned demonstration       OT Short Term Goals - 11/19/17 1814      OT SHORT TERM GOAL #1   Title  Pain decrease on PREE to less than 5/50     Baseline  At eval pain on PREE 12/50 and at the worse 9/10     Time  2    Period  Weeks    Status  New    Target Date  12/10/17      OT SHORT TERM GOAL #2   Title  Pt to be independent in HEP to decrease pain and flexibility     Baseline  very little knowledge on HEP     Time  2    Period  Weeks    Status  New    Target Date  12/10/17         OT Long Term Goals - 11/19/17 1817      OT LONG TERM GOAL #1   Title  Pt pain and symptoms decrease to less than 2-3/10 pain at the worse with use during day     Baseline  pain at the worse 9/10     Time  6    Period  Weeks    Status  New    Target Date  12/31/17      OT LONG TERM GOAL #2   Title  Pt to have knowledge and demo understanding to modify work , home  and sports act to decrease symptoms     Baseline  very little knowledge     Time  6    Period  Weeks    Status  New    Target Date  12/31/17            Plan - 12/11/17 1253    Clinical Impression Statement  Pt report pain still present with lifting or resistance agains fingers flexion - assess this date pain with resistance against FDS 7/10 at medial epicondyle , and less with FCU and FCR - pt to avoid tight and sustained grip with and without wrist flexion - pt was still working out in gym  and played basketball with kids yesterday - stretches better -  ionto done to FDS at medial epicondyle     Occupational performance deficits (Please refer to evaluation for details):  IADL's;Work;Play;Leisure    Rehab Potential  Good    Current Impairments/barriers affecting progress:  had pain since June     OT Frequency  2x / week    OT Duration  2 weeks    OT Treatment/Interventions  Patient/family education;Therapeutic exercise;Self-care/ADL training;Iontophoresis;Moist Heat;Ultrasound;Cryotherapy;Manual Therapy;Passive range of motion    Plan  assess progress  and pain with resistance - and fisting  Clinical Decision Making  Limited treatment options, no task modification necessary    OT Home Exercise Plan  see pt instruction     Consulted and Agree with Plan of Care  Patient       Patient will benefit from skilled therapeutic intervention in order to improve the following deficits and impairments:  Pain, Impaired UE functional use, Decreased knowledge of precautions, Decreased range of motion  Visit  Diagnosis: Pain in right elbow  Stiffness of right elbow, not elsewhere classified    Problem List Patient Active Problem List   Diagnosis Date Noted  . Need for hepatitis C screening test 07/12/2016  . Generalized abdominal pain 03/24/2016  . Gastroesophageal reflux disease without esophagitis 03/01/2016  . OSA on CPAP 02/28/2016  . Vitamin D insufficiency 08/31/2015  . BPH with obstruction/lower urinary tract symptoms 04/30/2015  . Annual physical exam 03/16/2015  . Dyslipidemia 03/01/2015  . Erectile dysfunction of organic origin 10/29/2014  . Arthritis of lumbar spine 10/22/2014  . Right hip pain 09/21/2014  . Chronic mid back pain 09/21/2014  . Right thigh pain 08/20/2014  . Allergic rhinitis 08/17/2014  . Blood glucose elevated 08/17/2014  . Abnormal kidney function study 08/17/2014  . Hypogonadism in male 07/30/2014  . Sebaceous cyst 07/21/2014  . Failure of erection 07/21/2014  . Low testosterone 07/21/2014  . Low back strain 07/21/2014  . Hypertension     Rosalyn Gess OTR/L,CLT 12/11/2017, 1:01 PM  [facility] [address] [place], [address] Phone: [phone]   Fax:  [phone]  Name: [place] Marsalis MRN: [id number] Date of Birth: 08-03-1960

## 2017-12-14 ENCOUNTER — Ambulatory Visit: Payer: BLUE CROSS/BLUE SHIELD | Admitting: Occupational Therapy

## 2017-12-18 ENCOUNTER — Ambulatory Visit: Payer: BLUE CROSS/BLUE SHIELD | Admitting: Occupational Therapy

## 2017-12-18 DIAGNOSIS — M25521 Pain in right elbow: Secondary | ICD-10-CM

## 2017-12-18 DIAGNOSIS — M25621 Stiffness of right elbow, not elsewhere classified: Secondary | ICD-10-CM | POA: Diagnosis not present

## 2017-12-18 NOTE — Patient Instructions (Signed)
Same HEP - hold off on any sports or weight that involve wrist flexion and fisting or gripping

## 2017-12-18 NOTE — Therapy (Signed)
[facility] [address], [place], [address] Phone: [phone]   Fax:  [phone]  Occupational Therapy Treatment  Patient Details  Name: Seth Meadows MRN: [id number] Date of Birth: [birth year] Referring Provider (OT): Raelyn Ensign   Encounter Date: 12/18/2017  OT End of Session - 12/18/17 1817    Visit Number  7    Number of Visits  11    Date for OT Re-Evaluation  01/15/18    OT Start Time  0855    OT Stop Time  0939    OT Time Calculation (min)  44 min    Activity Tolerance  Patient tolerated treatment well    Behavior During Therapy  [facility] for tasks assessed/performed       Past Medical History:  Diagnosis Date  . Allergic rhinitis   . BPH with obstruction/lower urinary tract symptoms   . Cutaneous eruption   . Earache   . Epididymitis, left   . Hyperglycemia   . Hypertension   . Hypogonadism in male   . Kidney function test abnormal   . Overweight   . Scrotal pain   . Sleep apnea     Past Surgical History:  Procedure Laterality Date  . EYELID LACERATION REPAIR  01/2017   cut the the eye lid to drain the infection out  . WISDOM TOOTH EXTRACTION      There were no vitals filed for this visit.  Subjective Assessment - 12/18/17 1814    Subjective   Pain better with pushing or resistance against my writs and fist, but some soreness in the evening at the elbow -and tender     Patient Stated Goals  I just want the pain better - so I can cont lifting weights in gym and play volleyball - and use my R dominant arm     Currently in Pain?  Yes    Pain Score  7     Pain Location  --   medial epicondyle    Pain Orientation  Right    Pain Descriptors / Indicators  Tender    Pain Type  --   palpation    Pain Onset  More than a month ago              no pain with AROM stretches to forearm flexors - cont with PROM stretches with forearm in sup and neutral - also this date no pain with  them Pain with  Resistance in grip and wrist  flexion -  This date 2-3/10   Tender still at medial epicondyle about 7/10     Pt to hold off on working out with machines UB strengthening - pt was still doing some of the machines but kept wrist straight  Last week start - but this week stopped it - also to stop basketball with kids  pt using FDS and pain with gripping  - to hold off   avoid tight , sustained and repetitive grip      Soft tissue mobs done with graston tool nr 2 and 4 for sweeping over volar forearm and wrist - tight on proximal flexors-       OT Treatments/Exercises (OP) - 12/18/17 0001      Moist Heat Therapy   Moist Heat Location  --   foreamr and elbow prior to manual      Iontophoresis   Type of Iontophoresis  Dexamethasone    Location  med epicondyle  Dose  med patch 2.0 current decrease to 1.5     Time  25      Had to decrease current 5 min in - to 1.5- skin check done - had no blisters          OT Short Term Goals - 12/18/17 1820      OT SHORT TERM GOAL #1   Title  Pain decrease on PREE to less than 5/50     Baseline  At eval pain on PREE 12/50 and at the worse 9/10 - palpation 7/10 but with use or resistance 2-31/0     Time  3    Period  Weeks    Status  On-going    Target Date  01/08/18      OT SHORT TERM GOAL #2   Title  Pt to be independent in HEP to decrease pain and flexibility     Status  Achieved        OT Long Term Goals - 12/18/17 1821      OT LONG TERM GOAL #1   Title  Pt pain and symptoms decrease to less than 2-3/10 pain at the worse with use during day     Baseline  pain at the worse 9/10  was - now with use 3-4/10 - but palpation still 7/10     Time  4    Period  Weeks    Status  On-going    Target Date  01/15/18      OT LONG TERM GOAL #2   Title  Pt to have knowledge and demo understanding to modify work , home  and sports act to decrease symptoms     Status  Achieved            Plan - 12/18/17  1818    Clinical Impression Statement  Pt report less pain with resistance agasint digits flexion and wrist fleixon this date - 2/10  - but still tender to touch and palpation at medial epicondyle - made good progress - had 6th session of ionto this date - did discuss with pt last session - that need to hold off on any UB workout with weight and basketball - while going thru treatment     Occupational performance deficits (Please refer to evaluation for details):  IADL's;Work;Play;Leisure    Rehab Potential  Good    Current Impairments/barriers affecting progress:  had pain since June     OT Frequency  --   1-2 x wk   OT Duration  4 weeks    OT Treatment/Interventions  Patient/family education;Therapeutic exercise;Self-care/ADL training;Iontophoresis;Moist Heat;Ultrasound;Cryotherapy;Manual Therapy;Passive range of motion    Plan  assess progress  and pain with resistance - and fisting  as well as palpation    Clinical Decision Making  Limited treatment options, no task modification necessary    OT Home Exercise Plan  see pt instruction     Consulted and Agree with Plan of Care  Patient       Patient will benefit from skilled therapeutic intervention in order to improve the following deficits and impairments:  Pain, Impaired UE functional use, Decreased knowledge of precautions, Decreased range of motion  Visit Diagnosis: Pain in right elbow  Stiffness of right elbow, not elsewhere classified    Problem List Patient Active Problem List   Diagnosis Date Noted  . Need for hepatitis C screening test 07/12/2016  . Generalized abdominal pain 03/24/2016  . Gastroesophageal reflux disease without esophagitis 03/01/2016  .  OSA on CPAP 02/28/2016  . Vitamin D insufficiency 08/31/2015  . BPH with obstruction/lower urinary tract symptoms 04/30/2015  . Annual physical exam 03/16/2015  . Dyslipidemia 03/01/2015  . Erectile dysfunction of organic origin 10/29/2014  . Arthritis of lumbar spine  10/22/2014  . Right hip pain 09/21/2014  . Chronic mid back pain 09/21/2014  . Right thigh pain 08/20/2014  . Allergic rhinitis 08/17/2014  . Blood glucose elevated 08/17/2014  . Abnormal kidney function study 08/17/2014  . Hypogonadism in male 07/30/2014  . Sebaceous cyst 07/21/2014  . Failure of erection 07/21/2014  . Low testosterone 07/21/2014  . Low back strain 07/21/2014  . Hypertension     Rosalyn Gess OTR/L,CLT 12/18/2017, 6:22 PM  [facility] [address], [place], [address] Phone: [phone]   Fax:  [phone]  Name: Cleveland Yarbro MRN: [id number] Date of Birth: September 05, 1960

## 2017-12-24 ENCOUNTER — Ambulatory Visit: Payer: BLUE CROSS/BLUE SHIELD | Attending: Family Medicine | Admitting: Occupational Therapy

## 2017-12-24 DIAGNOSIS — M25521 Pain in right elbow: Secondary | ICD-10-CM | POA: Diagnosis not present

## 2017-12-24 DIAGNOSIS — M25621 Stiffness of right elbow, not elsewhere classified: Secondary | ICD-10-CM | POA: Diagnosis not present

## 2017-12-24 NOTE — Therapy (Signed)
[facility] [address], [place], [address] Phone: [phone]   Fax:  [phone]  Occupational Therapy Treatment  Patient Details  Name: Seth Meadows MRN: [id number] Date of Birth: 12/27/1960 Referring Provider (OT): Raelyn Ensign   Encounter Date: 12/24/2017  OT End of Session - 12/24/17 1337    Visit Number  8    Number of Visits  11    Date for OT Re-Evaluation  01/15/18    OT Start Time  6962    OT Stop Time  1327    OT Time Calculation (min)  51 min    Activity Tolerance  Patient tolerated treatment well    Behavior During Therapy  [facility] for tasks assessed/performed       Past Medical History:  Diagnosis Date  . Allergic rhinitis   . BPH with obstruction/lower urinary tract symptoms   . Cutaneous eruption   . Earache   . Epididymitis, left   . Hyperglycemia   . Hypertension   . Hypogonadism in male   . Kidney function test abnormal   . Overweight   . Scrotal pain   . Sleep apnea     Past Surgical History:  Procedure Laterality Date  . EYELID LACERATION REPAIR  01/2017   cut the the eye lid to drain the infection out  . WISDOM TOOTH EXTRACTION      There were no vitals filed for this visit.  Subjective Assessment - 12/24/17 1333    Subjective   Done okay - but this am my elbow hurt some before getting up and even when I got to work- like throb - 3/10 -but was okay over the weekend     Patient Stated Goals  I just want the pain better - so I can cont lifting weights in gym and play volleyball - and use my R dominant arm     Currently in Pain?  Yes    Pain Score  0-No pain    Pain Location  --   elbow cubital tunnel    Pain Orientation  Right    Pain Descriptors / Indicators  --   Tender - one spot       no pain this date with flexors of forearm and digits resistance  - tenderness more over cubital tunnel this date No pull with forearm flexors stretch and Ulnar N glide  Pt report  had this am some throbbing pain in medial elbow before getting up and stopped not until after getting to work  Negative for Tinel  Pt ed on activities or positions that bother cubital tunnel  Made pt elbow pad to wear at night time to decrease elbow flexion  And when sitting - padding for cubital tunnel - avoid propping up              OT Treatments/Exercises (OP) - 12/24/17 0001      Moist Heat Therapy   Moist Heat Location  --   foreram prior to soft tissue mobs      Iontophoresis   Type of Iontophoresis  Dexamethasone    Location  med epicondyle    Dose  med patch 2.0 currrent    Time  19       Pt tolerate patch well - pt to keep it on for a hour afterwards       OT Education - 12/24/17 1337    Education Details  elbow pad wearing - and act  to modify or avoid    Person(s) Educated  Patient    Methods  Explanation;Demonstration    Comprehension  Verbalized understanding;Returned demonstration       OT Short Term Goals - 12/18/17 1820      OT SHORT TERM GOAL #1   Title  Pain decrease on PREE to less than 5/50     Baseline  At eval pain on PREE 12/50 and at the worse 9/10 - palpation 7/10 but with use or resistance 2-31/0     Time  3    Period  Weeks    Status  On-going    Target Date  01/08/18      OT SHORT TERM GOAL #2   Title  Pt to be independent in HEP to decrease pain and flexibility     Status  Achieved        OT Long Term Goals - 12/18/17 1821      OT LONG TERM GOAL #1   Title  Pt pain and symptoms decrease to less than 2-3/10 pain at the worse with use during day     Baseline  pain at the worse 9/10  was - now with use 3-4/10 - but palpation still 7/10     Time  4    Period  Weeks    Status  On-going    Target Date  01/15/18      OT LONG TERM GOAL #2   Title  Pt to have knowledge and demo understanding to modify work , home  and sports act to decrease symptoms     Status  Achieved            Plan - 12/24/17 1337    Clinical  Impression Statement  Pt show no pai with resistance to digits or wrist flexion - and no to very little pull with wrist flexor stretch - was tender this date in cubital tunnel - but no Tinel - pt do sleep per pt with C-pap on stomach and prop his face certain way with R arm - elbow pad provided and discuss wearing at night and during day when sitting and propping arm on armrest -     Occupational performance deficits (Please refer to evaluation for details):  IADL's;Work;Play;Leisure    Rehab Potential  Good    Current Impairments/barriers affecting progress:  had pain since June     OT Frequency  1x / week    OT Duration  4 weeks    OT Treatment/Interventions  Patient/family education;Therapeutic exercise;Self-care/ADL training;Iontophoresis;Moist Heat;Ultrasound;Cryotherapy;Manual Therapy;Passive range of motion    Plan  assess progress  and pain with resistance - and fisting  as well as palpation in medial epicondyle and cubital tunnel -     Clinical Decision Making  Limited treatment options, no task modification necessary    OT Home Exercise Plan  see pt instruction     Consulted and Agree with Plan of Care  Patient       Patient will benefit from skilled therapeutic intervention in order to improve the following deficits and impairments:  Pain, Impaired UE functional use, Decreased knowledge of precautions, Decreased range of motion  Visit Diagnosis: Pain in right elbow  Stiffness of right elbow, not elsewhere classified    Problem List Patient Active Problem List   Diagnosis Date Noted  . Need for hepatitis C screening test 07/12/2016  . Generalized abdominal pain 03/24/2016  . Gastroesophageal reflux disease without esophagitis 03/01/2016  . OSA on CPAP  02/28/2016  . Vitamin D insufficiency 08/31/2015  . BPH with obstruction/lower urinary tract symptoms 04/30/2015  . Annual physical exam 03/16/2015  . Dyslipidemia 03/01/2015  . Erectile dysfunction of organic origin  10/29/2014  . Arthritis of lumbar spine 10/22/2014  . Right hip pain 09/21/2014  . Chronic mid back pain 09/21/2014  . Right thigh pain 08/20/2014  . Allergic rhinitis 08/17/2014  . Blood glucose elevated 08/17/2014  . Abnormal kidney function study 08/17/2014  . Hypogonadism in male 07/30/2014  . Sebaceous cyst 07/21/2014  . Failure of erection 07/21/2014  . Low testosterone 07/21/2014  . Low back strain 07/21/2014  . Hypertension     Tanashia Ciesla OTR/L,CLT 12/24/2017, 1:40 PM  [facility] [address], [place], [address] Phone: [phone]   Fax:  [phone]  Name: Seth Meadows MRN: [id number] Date of Birth: October 21, 1960

## 2017-12-24 NOTE — Patient Instructions (Signed)
Provided elbow pad to wear at night time on anterior elbow to prevent increase flexion  and then on cubital tunnel when sitting and propping up elbow

## 2017-12-25 ENCOUNTER — Other Ambulatory Visit: Payer: Self-pay | Admitting: Urology

## 2017-12-25 DIAGNOSIS — N529 Male erectile dysfunction, unspecified: Secondary | ICD-10-CM

## 2017-12-31 ENCOUNTER — Ambulatory Visit: Payer: BLUE CROSS/BLUE SHIELD | Admitting: Occupational Therapy

## 2017-12-31 DIAGNOSIS — M25521 Pain in right elbow: Secondary | ICD-10-CM | POA: Diagnosis not present

## 2017-12-31 DIAGNOSIS — M25621 Stiffness of right elbow, not elsewhere classified: Secondary | ICD-10-CM | POA: Diagnosis not present

## 2017-12-31 NOTE — Patient Instructions (Signed)
Stop with Passive stretches of forearm and wrist flexors -as well as massage

## 2017-12-31 NOTE — Therapy (Signed)
[facility] [address] [place], [address] Phone: [phone]   Fax:  [phone]  Occupational Therapy Treatment  Patient Details  Name: Seth Meadows MRN: [id number] Date of Birth: Oct 11, 1960 Referring Provider (OT): Raelyn Ensign   Encounter Date: 12/31/2017  OT End of Session - 12/31/17 1847    Visit Number  9    Number of Visits  11    Date for OT Re-Evaluation  01/15/18    OT Start Time  1146    OT Stop Time  1241    OT Time Calculation (min)  55 min    Activity Tolerance  Patient tolerated treatment well    Behavior During Therapy  [facility] for tasks assessed/performed       Past Medical History:  Diagnosis Date  . Allergic rhinitis   . BPH with obstruction/lower urinary tract symptoms   . Cutaneous eruption   . Earache   . Epididymitis, left   . Hyperglycemia   . Hypertension   . Hypogonadism in male   . Kidney function test abnormal   . Overweight   . Scrotal pain   . Sleep apnea     Past Surgical History:  Procedure Laterality Date  . EYELID LACERATION REPAIR  01/2017   cut the the eye lid to drain the infection out  . WISDOM TOOTH EXTRACTION      There were no vitals filed for this visit.  Subjective Assessment - 12/31/17 1840    Subjective   I have some pain in the evening and am at the elbow - Meadows if I did not do anything - about 3-4/10  at the elbow     Patient Stated Goals  I just want the pain better - so I can cont lifting weights in gym and play volleyball - and use my R dominant arm     Currently in Pain?  Yes    Pain Score  0-No pain   7/10 tenderness at medial epicondyle         Pt cont to be tender at medial epicondyle -7/10   pt no pain with elbow flexion or wrist with hand in flexion end range  With resistance  But pain at neutral when applied resistance to wrist flexors  pt to stop wrist flexor passive stretches as well as massage    and refer to make appt with  ortho Dr Candelaria Stagers - for evaluation of persistent symptoms of tenderness over medial epicondyle   Not tender in cubital tunnel and sensory changes  But can try and sleep with elbow pt to keep elbow more straight when sleeping  Elbow pad last time made by OT was to tight           OT Treatments/Exercises (OP) - 12/31/17 0001      Moist Heat Therapy   Moist Heat Location  --   R foeram prior to soft tissue      Iontophoresis   Type of Iontophoresis  Dexamethasone    Location  med epicondyle    Dose  med patch 2.0 currrent    Time  19             OT Education - 12/31/17 1847    Education Details  elbow pad wearing - and act to modify or avoid    Person(s) Educated  Patient    Methods  Explanation;Demonstration    Comprehension  Verbalized understanding;Returned demonstration  OT Short Term Goals - 12/31/17 1853      OT SHORT TERM GOAL #1   Title  Pain decrease on PREE to less than 5/50     Baseline  At eval pain on PREE 12/50  and now with palpation 7/10 the worse and with  resistance 2-31/0     Status  Partially Met      OT SHORT TERM GOAL #2   Title  Pt to be independent in HEP to decrease pain and flexibility     Status  Achieved        OT Long Term Goals - 12/31/17 1856      OT LONG TERM GOAL #1   Title  Pt pain and symptoms decrease to less than 2-3/10 pain at the worse with use during day     Baseline   now with use 3-4/10 - but palpation still 7/10     Status  Partially Met      OT LONG TERM GOAL #2   Title  Pt to have knowledge and demo understanding to modify work , home  and sports act to decrease symptoms     Status  Achieved            Plan - 12/31/17 1849    Clinical Impression Statement  Pt was seen for 9 sessions  with great  progress in pain , foreram tightness and ROM  - with 8 session of ionto - but  pt cont to be tender at medial epicondyle about 7/10 - and then with some pain at elbow randomly in evening and am - pt do  sleep with C-PAP - and sleep with elbow flex -  pt to stop the stretches this next 3 days and the massage - and to make appt with Dr Candelaria Stagers for ortho consult if tenderness and pain continues     Occupational performance deficits (Please refer to evaluation for details):  IADL's;Work;Play;Leisure    Rehab Potential  Good    Current Impairments/barriers affecting progress:  had pain since June     OT Treatment/Interventions  Patient/family education;Therapeutic exercise;Self-care/ADL training;Iontophoresis;Moist Heat;Ultrasound;Cryotherapy;Manual Therapy;Passive range of motion    Plan  pt to contact me if appt with ortho and results     Clinical Decision Making  Limited treatment options, no task modification necessary    OT Home Exercise Plan  see pt instruction        Patient will benefit from skilled therapeutic intervention in order to improve the following deficits and impairments:  Pain, Impaired UE functional use, Decreased knowledge of precautions, Decreased range of motion  Visit Diagnosis: Pain in right elbow  Stiffness of right elbow, not elsewhere classified    Problem List Patient Active Problem List   Diagnosis Date Noted  . Need for hepatitis C screening test 07/12/2016  . Generalized abdominal pain 03/24/2016  . Gastroesophageal reflux disease without esophagitis 03/01/2016  . OSA on CPAP 02/28/2016  . Vitamin D insufficiency 08/31/2015  . BPH with obstruction/lower urinary tract symptoms 04/30/2015  . Annual physical exam 03/16/2015  . Dyslipidemia 03/01/2015  . Erectile dysfunction of organic origin 10/29/2014  . Arthritis of lumbar spine 10/22/2014  . Right hip pain 09/21/2014  . Chronic mid back pain 09/21/2014  . Right thigh pain 08/20/2014  . Allergic rhinitis 08/17/2014  . Blood glucose elevated 08/17/2014  . Abnormal kidney function study 08/17/2014  . Hypogonadism in male 07/30/2014  . Sebaceous cyst 07/21/2014  . Failure of erection 07/21/2014  .  Low testosterone 07/21/2014  . Low back strain 07/21/2014  . Hypertension     Rosalyn Gess OTR/L,CLT 12/31/2017, 6:57 PM  [facility] [address], [place], [address] Phone: [phone]   Fax:  [phone]  Name: Cohen Boettner MRN: [id number] Date of Birth: 09/03/1960

## 2018-01-04 DIAGNOSIS — M25521 Pain in right elbow: Secondary | ICD-10-CM | POA: Diagnosis not present

## 2018-01-04 DIAGNOSIS — M7701 Medial epicondylitis, right elbow: Secondary | ICD-10-CM | POA: Diagnosis not present

## 2018-01-04 DIAGNOSIS — M778 Other enthesopathies, not elsewhere classified: Secondary | ICD-10-CM | POA: Diagnosis not present

## 2018-01-04 DIAGNOSIS — M19021 Primary osteoarthritis, right elbow: Secondary | ICD-10-CM | POA: Diagnosis not present

## 2018-01-08 DIAGNOSIS — R7989 Other specified abnormal findings of blood chemistry: Secondary | ICD-10-CM | POA: Diagnosis not present

## 2018-01-24 ENCOUNTER — Other Ambulatory Visit: Payer: Self-pay

## 2018-01-24 DIAGNOSIS — E349 Endocrine disorder, unspecified: Secondary | ICD-10-CM

## 2018-01-25 ENCOUNTER — Other Ambulatory Visit: Payer: Self-pay

## 2018-01-25 DIAGNOSIS — E349 Endocrine disorder, unspecified: Secondary | ICD-10-CM

## 2018-01-26 LAB — TESTOSTERONE,FREE AND TOTAL
Testosterone, Free: 10.2 pg/mL (ref 7.2–24.0)
Testosterone: 496 ng/dL (ref 264–916)

## 2018-01-26 LAB — HEMATOCRIT: Hematocrit: 45.4 % (ref 37.5–51.0)

## 2018-01-26 LAB — ESTRADIOL: Estradiol: 29.1 pg/mL (ref 7.6–42.6)

## 2018-01-28 ENCOUNTER — Encounter: Payer: Self-pay | Admitting: Family Medicine

## 2018-02-22 DIAGNOSIS — G4733 Obstructive sleep apnea (adult) (pediatric): Secondary | ICD-10-CM | POA: Diagnosis not present

## 2018-03-26 ENCOUNTER — Other Ambulatory Visit: Payer: Self-pay

## 2018-03-26 DIAGNOSIS — E349 Endocrine disorder, unspecified: Secondary | ICD-10-CM

## 2018-03-27 LAB — PSA: Prostate Specific Ag, Serum: 1.3 ng/mL (ref 0.0–4.0)

## 2018-03-27 LAB — ESTRADIOL: Estradiol: 27.4 pg/mL (ref 7.6–42.6)

## 2018-03-27 LAB — TESTOSTERONE,FREE AND TOTAL
Testosterone, Free: 6.5 pg/mL — ABNORMAL LOW (ref 7.2–24.0)
Testosterone: 328 ng/dL (ref 264–916)

## 2018-04-03 DIAGNOSIS — R7989 Other specified abnormal findings of blood chemistry: Secondary | ICD-10-CM | POA: Diagnosis not present

## 2018-04-09 ENCOUNTER — Ambulatory Visit: Payer: BLUE CROSS/BLUE SHIELD | Admitting: Family Medicine

## 2018-04-09 ENCOUNTER — Encounter: Payer: Self-pay | Admitting: Family Medicine

## 2018-04-09 ENCOUNTER — Other Ambulatory Visit: Payer: Self-pay

## 2018-04-09 VITALS — BP 132/84 | HR 76 | Temp 97.9°F | Resp 16 | Ht 73.0 in | Wt 263.6 lb

## 2018-04-09 DIAGNOSIS — E785 Hyperlipidemia, unspecified: Secondary | ICD-10-CM

## 2018-04-09 DIAGNOSIS — R0609 Other forms of dyspnea: Secondary | ICD-10-CM | POA: Diagnosis not present

## 2018-04-09 DIAGNOSIS — E6609 Other obesity due to excess calories: Secondary | ICD-10-CM

## 2018-04-09 DIAGNOSIS — R0789 Other chest pain: Secondary | ICD-10-CM

## 2018-04-09 DIAGNOSIS — R0600 Dyspnea, unspecified: Secondary | ICD-10-CM

## 2018-04-09 DIAGNOSIS — I10 Essential (primary) hypertension: Secondary | ICD-10-CM

## 2018-04-09 DIAGNOSIS — Z6834 Body mass index (BMI) 34.0-34.9, adult: Secondary | ICD-10-CM

## 2018-04-09 MED ORDER — AMLODIPINE BESYLATE 10 MG PO TABS: 10.0000 mg | ORAL_TABLET | Freq: Every day | ORAL | 1 refills | Status: DC

## 2018-04-09 NOTE — Progress Notes (Signed)
Name: Seth Meadows   MRN: [id number]    DOB: [birth year]   Date:04/09/2018       Progress Note  Subjective  Chief Complaint  Chief Complaint  Patient presents with  . Hypertension    medication refill, follow up  . Hyperlipidemia    HPI  Hyperlipidemia: Current Medication Regimen: Was taking atorvastatin for 2 months straight  Last Lipids: - Current Diet: - Denies myalgias.  Experienced bloated stomach and diarrhea - Documented aortic atherosclerosis? No - Risk factors for atherosclerosis: hypercholesterolemia and hypertension  HTN:  -does take medications as prescribed - current regimen includes Amlodipine 10mg .  - taking medications as instructed, no medication side effects noted, no TIAs, no chest pain on exertion, no swelling of ankles, no palpitations.  Does note some dyspnea when walking up a flight of stairs, but this does not happen every time (ongoing for many years).  He did have echo and stress test about 5 years ago per patient report and was told things were normal, but to return if symptoms persisted - we will refer today. - DASH diet discussed - pt does follow a low sodium diet; salt not added to cooking and salt shaker not on table   Obesity: Was playing volley ball, walking, elliptical, and lifting weights, but the [employer] is closed right now.  He is trying to walk more often. Eating a balanced diet.  Patient Active Problem List   Diagnosis Date Noted  . Need for hepatitis C screening test 07/12/2016  . Generalized abdominal pain 03/24/2016  . Gastroesophageal reflux disease without esophagitis 03/01/2016  . OSA on CPAP 02/28/2016  . Vitamin D insufficiency 08/31/2015  . BPH with obstruction/lower urinary tract symptoms 04/30/2015  . Annual physical exam 03/16/2015  . Dyslipidemia 03/01/2015  . Erectile dysfunction of organic origin 10/29/2014  . Arthritis of lumbar spine 10/22/2014  . Right hip pain 09/21/2014  . Chronic mid back pain 09/21/2014  . Right  thigh pain 08/20/2014  . Allergic rhinitis 08/17/2014  . Blood glucose elevated 08/17/2014  . Abnormal kidney function study 08/17/2014  . Hypogonadism in male 07/30/2014  . Sebaceous cyst 07/21/2014  . Failure of erection 07/21/2014  . Low testosterone 07/21/2014  . Low back strain 07/21/2014  . Hypertension     Past Surgical History:  Procedure Laterality Date  . EYELID LACERATION REPAIR  01/2017   cut the the eye lid to drain the infection out  . WISDOM TOOTH EXTRACTION      Family History  Problem Relation Age of Onset  . Hypertension Mother   . Diabetes Mother   . Hypertension Father   . Prostate cancer Father   . Diabetes Brother   . Kidney disease Neg Hx   . Bladder Cancer Neg Hx     Social History   Socioeconomic History  . Marital status: Married    Spouse name: Not on file  . Number of children: Not on file  . Years of education: Not on file  . Highest education level: Not on file  Occupational History  . Not on file  Social Needs  . Financial resource strain: Not on file  . Food insecurity:    Worry: Not on file    Inability: Not on file  . Transportation needs:    Medical: Not on file    Non-medical: Not on file  Tobacco Use  . Smoking status: Never Smoker  . Smokeless tobacco: Never Used  Substance and Sexual Activity  . Alcohol  use: No    Comment: occasional  . Drug use: No  . Sexual activity: Yes  Lifestyle  . Physical activity:    Days per week: Not on file    Minutes per session: Not on file  . Stress: Not on file  Relationships  . Social connections:    Talks on phone: Not on file    Gets together: Not on file    Attends religious service: Not on file    Active member of club or organization: Not on file    Attends meetings of clubs or organizations: Not on file    Relationship status: Not on file  . Intimate partner violence:    Fear of current or ex partner: Not on file    Emotionally abused: Not on file    Physically abused:  Not on file    Forced sexual activity: Not on file  Other Topics Concern  . Not on file  Social History Narrative  . Not on file     Current Outpatient Medications:  .  amLODipine (NORVASC) 10 MG tablet, Take 1 tablet (10 mg total) by mouth daily. (Patient taking differently: Take 5 mg by mouth daily. ), Disp: 90 tablet, Rfl: 1 .  ibuprofen (ADVIL,MOTRIN) 800 MG tablet, Take 1 tablet (800 mg total) by mouth every 8 (eight) hours as needed for moderate pain., Disp: 30 tablet, Rfl: 0 .  naproxen (NAPROSYN) 500 MG tablet, Take 1 tablet (500 mg total) by mouth 2 (two) times daily with a meal., Disp: 30 tablet, Rfl: 0 .  testosterone cypionate (DEPOTESTOSTERONE CYPIONATE) 200 MG/ML injection, , Disp: , Rfl: 0 .  atorvastatin (LIPITOR) 20 MG tablet, Take 1 tablet ( 20 mg total) daily at bedtime. (Patient not taking: Reported on 10/12/2017), Disp: 90 tablet, Rfl: 1  No Known Allergies  I personally reviewed active problem list, medication list, allergies, health maintenance, notes from last encounter, lab results with the patient/caregiver today.   ROS  Constitutional: Negative for fever or weight change.  Respiratory: Negative for cough. +DOE  Cardiovascular: See HPI.  Gastrointestinal: Negative for abdominal pain, no bowel changes.  Musculoskeletal: Negative for gait problem or joint swelling.  Skin: Negative for rash.  Neurological: Negative for dizziness or headache.  No other specific complaints in a complete review of systems (except as listed in HPI above).  Objective  Vitals:   04/09/18 1127  BP: 132/84  Pulse: 76  Resp: 16  Temp: 97.9 F (36.6 C)  TempSrc: Oral  SpO2: 99%  Weight: 263 lb 9.6 oz (119.6 kg)  Height: 6\' 1"  (1.854 m)   Body mass index is 34.78 kg/m.  Physical Exam  Constitutional: Patient appears well-developed and well-nourished. No distress.  HENT: Head: Normocephalic and atraumatic. Eyes: Conjunctivae and EOM are normal. No scleral icterus. Neck:  Normal range of motion. Neck supple. No JVD present. No thyromegaly present.  Cardiovascular: Normal rate, regular rhythm and normal heart sounds.  No murmur heard. No BLE edema. Pulmonary/Chest: Effort normal and breath sounds normal. No respiratory distress. Musculoskeletal: Normal range of motion, no joint effusions. No gross deformities Neurological: Pt is alert and oriented to person, place, and time. No cranial nerve deficit. Coordination, balance, strength, speech and gait are normal.  Skin: Skin is warm and dry. No rash noted. No erythema.  Psychiatric: Patient has a normal mood and affect. behavior is normal. Judgment and thought content normal.  No results found for this or any previous visit (from the past 72 hour(s)).  PHQ2/9: Depression screen [facility] 2/9 04/09/2018 10/12/2017 03/08/2017 12/06/2016 07/12/2016  Decreased Interest 0 0 0 0 0  Down, Depressed, Hopeless 0 0 0 0 0  PHQ - 2 Score 0 0 0 0 0  Altered sleeping 0 0 - - -  Tired, decreased energy 0 0 - - -  Change in appetite 0 0 - - -  Feeling bad or failure about yourself  0 0 - - -  Trouble concentrating 0 0 - - -  Moving slowly or fidgety/restless 0 0 - - -  Suicidal thoughts 0 0 - - -  PHQ-9 Score 0 0 - - -  Difficult doing work/chores Not difficult at all Not difficult at all - - -   PHQ-2/9 Result is negative.    Fall Risk: Fall Risk  04/09/2018 10/12/2017 03/08/2017 12/06/2016 07/12/2016  Falls in the past year? 0 No No No No  Number falls in past yr: 0 - - - -  Injury with Fall? 0 - - - -  Follow up Falls evaluation completed - - - -   Assessment & Plan  1. Dyslipidemia - Ambulatory referral to Cardiology - EKG 12-Lead - Lipid panel  2. Essential hypertension - DASH diet discussed. - amLODipine (NORVASC) 10 MG tablet; Take 1 tablet (10 mg total) by mouth daily.  Dispense: 90 tablet; Refill: 1 - Ambulatory referral to Cardiology - EKG 12-Lead - Lipid panel - COMPLETE METABOLIC PANEL WITH GFR  3.  Dyspnea on exertion - Ambulatory referral to Cardiology - EKG 12-Lead - Lipid panel - COMPLETE METABOLIC PANEL WITH GFR  4. Chest tightness - Ambulatory referral to Cardiology - EKG 12-Lead - Lipid panel  5. Class 1 obesity due to excess calories with serious comorbidity and body mass index (BMI) of 34.0 to 34.9 in adult - Lipid panel - COMPLETE METABOLIC PANEL WITH GFR - Discussed importance of 150 minutes of physical activity weekly, eat two servings of fish weekly, eat one serving of tree nuts ( cashews, pistachios, pecans, almonds.Marland Kitchen) every other day, eat 6 servings of fruit/vegetables daily and drink plenty of water and avoid sweet beverages.

## 2018-05-28 DIAGNOSIS — G4733 Obstructive sleep apnea (adult) (pediatric): Secondary | ICD-10-CM | POA: Diagnosis not present

## 2018-06-12 DIAGNOSIS — R7989 Other specified abnormal findings of blood chemistry: Secondary | ICD-10-CM | POA: Diagnosis not present

## 2018-09-06 ENCOUNTER — Telehealth: Payer: Self-pay

## 2018-09-06 NOTE — Telephone Encounter (Signed)
Patient is calling back to express his discontentment that the letter that psycical exercise is beneficial for his health is not completed.  Patient was advised that there is a 5-7 day turn around with letters.  Patient feels that this is unacceptable. And feels that that the letter could have been completed in 10 mins.   Patient would like to know when the letter is completed so that he can pick it up.  Thank you

## 2018-09-06 NOTE — Telephone Encounter (Signed)
Copied from [place] [id number]. Topic: General - Other >> Sep 06, 2018  1:31 PM Mcneil, Ja-Kwan wrote: Reason for CRM: [facility] and he was told he needs a letter with provider's signature stating that exercise is beneficial for his health so he can return to working out. Pt requests call back once letter is ready for pick up. >> Sep 06, 2018  3:45 PM Wynetta Emery, [place] C wrote: Pt called back in to follow up on gym note. Pt says that he is hoping to pick up note today so that he can return to the gym tonight / this weekend. Pt would like a call as soon as it's ready.

## 2018-09-08 NOTE — Telephone Encounter (Signed)
Pt called after 4pm Friday afternoon to request a letter, and again after 5pm the same day stating the letter should have been completed already.  Please advise that our policy is that we may have a 5-7 day turnaround and certainly never guarantee same day paperwork, especially for non-emergent items.  I did see him 04/09/2018, he does very well with exercise and I am fine with him resuming as long as he understands the risks of the reduced social distancing at the gym that put him at increased risk for contracting COVID-19.  Please wear a mask, use good hand hygiene, and do independent exercises only - I do not recommend team sports (like volley ball) right now.

## 2018-09-09 ENCOUNTER — Encounter: Payer: Self-pay | Admitting: Family Medicine

## 2018-09-09 NOTE — Telephone Encounter (Signed)
Yes - see note below. Thanks!

## 2018-09-09 NOTE — Telephone Encounter (Signed)
The [facility] is stating [facility] need a note to just say exercise is a benefit for his health. Can this be printed

## 2018-09-09 NOTE — Telephone Encounter (Signed)
Letter at front desk for pickup

## 2018-10-16 ENCOUNTER — Other Ambulatory Visit: Payer: Self-pay

## 2018-10-16 ENCOUNTER — Ambulatory Visit: Payer: BLUE CROSS/BLUE SHIELD | Admitting: Family Medicine

## 2018-10-16 ENCOUNTER — Encounter: Payer: Self-pay | Admitting: Family Medicine

## 2018-10-16 DIAGNOSIS — I10 Essential (primary) hypertension: Secondary | ICD-10-CM

## 2018-10-16 DIAGNOSIS — Z131 Encounter for screening for diabetes mellitus: Secondary | ICD-10-CM

## 2018-10-16 DIAGNOSIS — E785 Hyperlipidemia, unspecified: Secondary | ICD-10-CM

## 2018-10-16 DIAGNOSIS — E291 Testicular hypofunction: Secondary | ICD-10-CM

## 2018-10-16 DIAGNOSIS — Z0000 Encounter for general adult medical examination without abnormal findings: Secondary | ICD-10-CM

## 2018-10-16 DIAGNOSIS — Z114 Encounter for screening for human immunodeficiency virus [HIV]: Secondary | ICD-10-CM

## 2018-10-16 DIAGNOSIS — Z23 Encounter for immunization: Secondary | ICD-10-CM

## 2018-10-16 MED ORDER — AMLODIPINE BESYLATE-VALSARTAN 5-160 MG PO TABS: 1.0000 | ORAL_TABLET | Freq: Every day | ORAL | 1 refills | Status: DC

## 2018-10-16 NOTE — Patient Instructions (Signed)
Check with insurance if you can get shingrix vaccine    Preventive Care 3-59 Years Old, Male Preventive care refers to lifestyle choices and visits with your health care provider that can promote health and wellness. This includes:  A yearly physical exam. This is also called an annual well check.  Regular dental and eye exams.  Immunizations.  Screening for certain conditions.  Healthy lifestyle choices, such as eating a healthy diet, getting regular exercise, not using drugs or products that contain nicotine and tobacco, and limiting alcohol use. What can I expect for my preventive care visit? Physical exam Your health care provider will check:  Height and weight. These may be used to calculate body mass index (BMI), which is a measurement that tells if you are at a healthy weight.  Heart rate and blood pressure.  Your skin for abnormal spots. Counseling Your health care provider may ask you questions about:  Alcohol, tobacco, and drug use.  Emotional well-being.  Home and relationship well-being.  Sexual activity.  Eating habits.  Work and work Statistician. What immunizations do I need?  Influenza (flu) vaccine  This is recommended every year. Tetanus, diphtheria, and pertussis (Tdap) vaccine  You may need a Td booster every 10 years. Varicella (chickenpox) vaccine  You may need this vaccine if you have not already been vaccinated. Zoster (shingles) vaccine  You may need this after age 51. Measles, mumps, and rubella (MMR) vaccine  You may need at least one dose of MMR if you were born in 1957 or later. You may also need a second dose. Pneumococcal conjugate (PCV13) vaccine  You may need this if you have certain conditions and were not previously vaccinated. Pneumococcal polysaccharide (PPSV23) vaccine  You may need one or two doses if you smoke cigarettes or if you have certain conditions. Meningococcal conjugate (MenACWY) vaccine  You may need  this if you have certain conditions. Hepatitis A vaccine  You may need this if you have certain conditions or if you travel or work in places where you may be exposed to hepatitis A. Hepatitis B vaccine  You may need this if you have certain conditions or if you travel or work in places where you may be exposed to hepatitis B. Haemophilus influenzae type b (Hib) vaccine  You may need this if you have certain risk factors. Human papillomavirus (HPV) vaccine  If recommended by your health care provider, you may need three doses over 6 months. You may receive vaccines as individual doses or as more than one vaccine together in one shot (combination vaccines). Talk with your health care provider about the risks and benefits of combination vaccines. What tests do I need? Blood tests  Lipid and cholesterol levels. These may be checked every 5 years, or more frequently if you are over 55 years old.  Hepatitis C test.  Hepatitis B test. Screening  Lung cancer screening. You may have this screening every year starting at age 51 if you have a 30-pack-year history of smoking and currently smoke or have quit within the past 15 years.  Prostate cancer screening. Recommendations will vary depending on your family history and other risks.  Colorectal cancer screening. All adults should have this screening starting at age 76 and continuing until age 46. Your health care provider may recommend screening at age 29 if you are at increased risk. You will have tests every 1-10 years, depending on your results and the type of screening test.  Diabetes screening. This is  done by checking your blood sugar (glucose) after you have not eaten for a while (fasting). You may have this done every 1-3 years.  Sexually transmitted disease (STD) testing. Follow these instructions at home: Eating and drinking  Eat a diet that includes fresh fruits and vegetables, whole grains, lean protein, and low-fat dairy  products.  Take vitamin and mineral supplements as recommended by your health care provider.  Do not drink alcohol if your health care provider tells you not to drink.  If you drink alcohol: ? Limit how much you have to 0-2 drinks a day. ? Be aware of how much alcohol is in your drink. In the [place] one drink equals one 12 oz bottle of beer (355 mL), one 5 oz glass of wine (148 mL), or one 1 oz glass of hard liquor (44 mL). Lifestyle  Take daily care of your teeth and gums.  Stay active. Exercise for at least 30 minutes on 5 or more days each week.  Do not use any products that contain nicotine or tobacco, such as cigarettes, e-cigarettes, and chewing tobacco. If you need help quitting, ask your health care provider.  If you are sexually active, practice safe sex. Use a condom or other form of protection to prevent STIs (sexually transmitted infections).  Talk with your health care provider about taking a low-dose aspirin every day starting at age 56. What's next?  Go to your health care provider once a year for a well check visit.  Ask your health care provider how often you should have your eyes and teeth checked.  Stay up to date on all vaccines. This information is not intended to replace advice given to you by your health care provider. Make sure you discuss any questions you have with your health care provider. Document Released: 02/05/2015 Document Revised: 01/03/2018 Document Reviewed: 01/03/2018 Elsevier Patient Education  2020 [employer].

## 2018-10-16 NOTE — Progress Notes (Signed)
Name: Seth Meadows   MRN: [id number]    DOB: 12-24-1960   Date:10/16/2018       Progress Note  Subjective  Chief Complaint  Chief Complaint  Patient presents with  . Annual Exam  . Follow-up    HPI  Patient presents for annual CPE and follow up  Morbid obesity: he has a BMI of above 35 ,and has gained 21 lbs in the past year, explained that he has multiple co-morbidities such as OSA, dyslipidemia, chronic low back pain that may improve with weight loss, he seems to be eating well and exercising, discussed considering cutting down on carbohydrates   Dyslipidemia: HDL very low in the past, on testosterone supplementation and we will recheck levels, not currently on statin therapy, discussed increasing tree nuts, fish intake and exercise more to increase HDL  Hypogonadism: under the care of Urologist, last PSA was normal March 2020 , he is using injectable testosterone and also takes sildenafil. Denies acne or increase in aggression with medication  Hyperglycemia: in the past, we will recheck labs, denies polyphagia, polydipsia or polyuria   USPSTF grade A and B recommendations:  Diet: eating cereal for breakfast, chicken and frozen veggies for lunch and dinner at home , drinks water  Exercise: he has been weight lifting.   Depression: phq 9 is negative Depression screen [facility] 2/9 10/16/2018 04/09/2018 10/12/2017 03/08/2017 12/06/2016  Decreased Interest 0 0 0 0 0  Down, Depressed, Hopeless 0 0 0 0 0  PHQ - 2 Score 0 0 0 0 0  Altered sleeping 1 0 0 - -  Tired, decreased energy 0 0 0 - -  Change in appetite 0 0 0 - -  Feeling bad or failure about yourself  0 0 0 - -  Trouble concentrating 0 0 0 - -  Moving slowly or fidgety/restless 0 0 0 - -  Suicidal thoughts 0 0 0 - -  PHQ-9 Score 1 0 0 - -  Difficult doing work/chores Not difficult at all Not difficult at all Not difficult at all - -    Hypertension:  BP Readings from Last 3 Encounters:  10/16/18 106/82  04/09/18 132/84   10/12/17 122/70    Obesity: Wt Readings from Last 3 Encounters:  10/16/18 260 lb 9.6 oz (118.2 kg)  04/09/18 263 lb 9.6 oz (119.6 kg)  10/12/17 239 lb 6.4 oz (108.6 kg)   BMI Readings from Last 3 Encounters:  10/16/18 35.34 kg/m  04/09/18 34.78 kg/m  10/12/17 31.59 kg/m     Lipids:  Lab Results  Component Value Date   CHOL 109 03/08/2017   CHOL 180 12/07/2016   CHOL 146 07/10/2016   Lab Results  Component Value Date   HDL 35 (L) 03/08/2017   HDL 39 (L) 12/07/2016   HDL 29 (L) 07/10/2016   Lab Results  Component Value Date   LDLCALC 61 03/08/2017   LDLCALC 123 (H) 12/07/2016   LDLCALC 105 (H) 07/10/2016   Lab Results  Component Value Date   TRIG 58 03/08/2017   TRIG 83 12/07/2016   TRIG 61 07/10/2016   Lab Results  Component Value Date   CHOLHDL 3.1 03/08/2017   CHOLHDL 4.6 12/07/2016   CHOLHDL 5.0 (H) 07/10/2016   No results found for: LDLDIRECT Glucose:  Glucose  Date Value Ref Range Status  03/16/2015 89 65 - 99 mg/dL Final  07/21/2014 91 65 - 99 mg/dL Final    Comment:    Specimen received in contact with cells.  No visible hemolysis present. However GLUC may be decreased and K increased. Clinical correlation indicated.    Glucose, Bld  Date Value Ref Range Status  12/07/2016 93 65 - 99 mg/dL Final    Comment:    .            Fasting reference interval .   03/24/2016 89 65 - 99 mg/dL Final      [facility] Visit from 10/16/2018 in [facility]  AUDIT-C Score  0       Married STD testing and prevention (HIV/chl/gon/syphilis): he is okay with HIV test  Hep C: done in 2018   Skin cancer: discussed atypical lesions  Colorectal cancer: repeat in 2024  Prostate cancer: sees Urologist and last level was 1.3 in 03/2018   IPSS Questionnaire (AUA-7): Over the past month.   1)  How often have you had a sensation of not emptying your bladder completely after you finish urinating?  0 - Not at all  2)  How often have you had  to urinate again less than two hours after you finished urinating? 0 - Not at all  3)  How often have you found you stopped and started again several times when you urinated?  0 - Not at all  4) How difficult have you found it to postpone urination?  0 - Not at all  5) How often have you had a weak urinary stream?  0 - Not at all  6) How often have you had to push or strain to begin urination?  0 - Not at all  7) How many times did you most typically get up to urinate from the time you went to bed until the time you got up in the morning?  1 - 1 time  Total score:  0-7 mildly symptomatic   8-19 moderately symptomatic   20-35 severely symptomatic    Lung cancer:   Low Dose CT Chest recommended if Age 43-80 years, 30 pack-year currently smoking OR have quit w/in 15years. Patient does not qualify.   AAA:  The USPSTF recommends one-time screening with ultrasonography in men ages 28 to 60 years who have ever smoked, does not qualify  ECG:  2020   Advanced Care Planning: A voluntary discussion about advance care planning including the explanation and discussion of advance directives.  Discussed health care proxy and Living will, and the patient was able to identify a health care proxy as wife  Patient does not have a living will at present time.  Patient Active Problem List   Diagnosis Date Noted  . OSA on CPAP 02/28/2016  . Vitamin D insufficiency 08/31/2015  . Dyslipidemia 03/01/2015  . Erectile dysfunction of organic origin 10/29/2014  . Arthritis of lumbar spine 10/22/2014  . Intermittent low back pain 09/21/2014  . Blood glucose elevated 08/17/2014  . Hypogonadism in male 07/30/2014  . Hypertension     Past Surgical History:  Procedure Laterality Date  . EYELID LACERATION REPAIR  01/2017   cut the the eye lid to drain the infection out  . WISDOM TOOTH EXTRACTION      Family History  Problem Relation Age of Onset  . Hypertension Mother   . Diabetes Mother   . Hypertension  Father   . Prostate cancer Father   . Thyroid disease Father   . Skin cancer Sister   . Diabetes Brother   . Hypertension Brother   . Kidney disease Neg Hx   . Bladder Cancer  Neg Hx     Social History   Socioeconomic History  . Marital status: Married    Spouse name: July   . Number of children: 3  . Years of education: Not on file  . Highest education level: Bachelor's degree (e.g., BA, AB, BS)  Occupational History  . Occupation: Art gallery manager   Social Needs  . Financial resource strain: Not hard at all  . Food insecurity    Worry: Never true    Inability: Never true  . Transportation needs    Medical: No    Non-medical: No  Tobacco Use  . Smoking status: Never Smoker  . Smokeless tobacco: Never Used  Substance and Sexual Activity  . Alcohol use: No    Comment: occasional  . Drug use: No  . Sexual activity: Yes    Partners: Female  Lifestyle  . Physical activity    Days per week: 4 days    Minutes per session: 60 min  . Stress: Not at all  Relationships  . Social connections    Talks on phone: More than three times a week    Gets together: More than three times a week    Attends religious service: More than 4 times per year    Active member of club or organization: No    Attends meetings of clubs or organizations: Never    Relationship status: Married  . Intimate partner violence    Fear of current or ex partner: No    Emotionally abused: No    Physically abused: No    Forced sexual activity: No  Other Topics Concern  . Not on file  Social History Narrative   Married, has three children,    Twin daughters still in middle school   Son is going to school at [employer]      Current Outpatient Medications:  .  naproxen (NAPROSYN) 500 MG tablet, Take 1 tablet (500 mg total) by mouth 2 (two) times daily with a meal., Disp: 30 tablet, Rfl: 0 .  sildenafil (REVATIO) 20 MG tablet, TAKE 1 TO 5 TABLETS BY MOUTH AS NEEDED FOR ERECTILE DYSFUNCTION, Disp: , Rfl:   .  SYRINGE-NEEDLE, DISP, 3 ML (MONOJECT SAFETY SYRINGE/SHIELD) 23G X 1" 3 ML MISC, Inject 78mL intramuscular every 10days, Disp: , Rfl:  .  testosterone cypionate (DEPOTESTOSTERONE CYPIONATE) 200 MG/ML injection, , Disp: , Rfl: 0 .  amLODipine-valsartan (EXFORGE) 5-160 MG tablet, Take 1 tablet by mouth daily., Disp: 90 tablet, Rfl: 1  No Known Allergies   ROS  Constitutional: Negative for fever or weight change.  Respiratory: Negative for cough and shortness of breath.   Cardiovascular: Negative for chest pain or palpitations.  Gastrointestinal: Negative for abdominal pain, no bowel changes.  Musculoskeletal: Negative for gait problem or joint swelling.  Skin: Negative for rash.  Neurological: Negative for dizziness or headache.  No other specific complaints in a complete review of systems (except as listed in HPI above).   Objective  Vitals:   10/16/18 1542  BP: 106/82  Pulse: 97  Resp: 18  Temp: (!) 96.9 F (36.1 C)  TempSrc: Temporal  SpO2: 97%  Weight: 260 lb 9.6 oz (118.2 kg)  Height: 6' (1.829 m)    Body mass index is 35.34 kg/m.  Physical Exam  Constitutional: Patient appears well-developed and well-nourished. No distress.  HENT: Head: Normocephalic and atraumatic. Ears: B TMs ok, no erythema or effusion; Nose: Nose normal. Mouth/Throat: Oropharynx is clear and moist. No oropharyngeal exudate.  Eyes:  Conjunctivae and EOM are normal. Pupils are equal, round, and reactive to light. No scleral icterus.  Neck: Normal range of motion. Neck supple. No JVD present. No thyromegaly present.  Cardiovascular: Normal rate, regular rhythm and normal heart sounds.  No murmur heard. No BLE edema. Pulmonary/Chest: Effort normal and breath sounds normal. No respiratory distress. Abdominal: Soft. Bowel sounds are normal, no distension. There is no tenderness. no masses MALE GENITALIA: Normal descended testes bilaterally, no masses palpated, no hernias, no lesions, no  discharge RECTAL: Prostate normal size and consistency, no rectal masses or hemorrhoids Musculoskeletal: Normal range of motion, no joint effusions. No gross deformities Neurological: he is alert and oriented to person, place, and time. No cranial nerve deficit. Coordination, balance, strength, speech and gait are normal.  Skin: Skin is warm and dry. No rash noted. No erythema.  Psychiatric: Patient has a normal mood and affect. behavior is normal. Judgment and thought content normal.  PHQ2/9: Depression screen [facility] 2/9 10/16/2018 04/09/2018 10/12/2017 03/08/2017 12/06/2016  Decreased Interest 0 0 0 0 0  Down, Depressed, Hopeless 0 0 0 0 0  PHQ - 2 Score 0 0 0 0 0  Altered sleeping 1 0 0 - -  Tired, decreased energy 0 0 0 - -  Change in appetite 0 0 0 - -  Feeling bad or failure about yourself  0 0 0 - -  Trouble concentrating 0 0 0 - -  Moving slowly or fidgety/restless 0 0 0 - -  Suicidal thoughts 0 0 0 - -  PHQ-9 Score 1 0 0 - -  Difficult doing work/chores Not difficult at all Not difficult at all Not difficult at all - -     Fall Risk: Fall Risk  10/16/2018 04/09/2018 10/12/2017 03/08/2017 12/06/2016  Falls in the past year? 0 0 No No No  Number falls in past yr: 0 0 - - -  Injury with Fall? 0 0 - - -  Follow up - Falls evaluation completed - - -     Functional Status Survey: Is the patient deaf or have difficulty hearing?: No Does the patient have difficulty seeing, even when wearing glasses/contacts?: No Does the patient have difficulty concentrating, remembering, or making decisions?: No Does the patient have difficulty walking or climbing stairs?: No Does the patient have difficulty dressing or bathing?: No Does the patient have difficulty doing errands alone such as visiting a doctor's office or shopping?: No    Assessment & Plan  1. Well adult exam   2. Essential hypertension  - COMPLETE METABOLIC PANEL WITH GFR - CBC with Differential/Platelet -  amLODipine-valsartan (EXFORGE) 5-160 MG tablet; Take 1 tablet by mouth daily.  Dispense: 90 tablet; Refill: 1  3. Dyslipidemia  - Lipid panel  4. Morbid obesity ([place])  Discussed with the patient the risk posed by an increased BMI. Discussed importance of portion control, calorie counting and at least 150 minutes of physical activity weekly. Avoid sweet beverages and drink more water. Eat at least 6 servings of fruit and vegetables daily   5. Hypogonadism in male   30. Diabetes mellitus screening  - Hemoglobin A1c  7. Need for immunization against influenza  - Flu Vaccine QUAD 36+ mos IM  8. Encounter for screening for HIV  - HIV Antibody (routine testing w rflx)   -Prostate cancer screening and PSA options (with potential risks and benefits of testing vs not testing) were discussed along with recent recs/guidelines. -USPSTF grade A and B recommendations reviewed with  patient; age-appropriate recommendations, preventive care, screening tests, etc discussed and encouraged; healthy living encouraged; see AVS for patient education given to patient -Discussed importance of 150 minutes of physical activity weekly, eat two servings of fish weekly, eat one serving of tree nuts ( cashews, pistachios, pecans, almonds.Marland Kitchen) every other day, eat 6 servings of fruit/vegetables daily and drink plenty of water and avoid sweet beverages.

## 2018-11-12 ENCOUNTER — Other Ambulatory Visit: Payer: Self-pay

## 2018-11-19 ENCOUNTER — Other Ambulatory Visit: Payer: Self-pay

## 2018-11-19 DIAGNOSIS — Z114 Encounter for screening for human immunodeficiency virus [HIV]: Secondary | ICD-10-CM

## 2018-11-19 DIAGNOSIS — E785 Hyperlipidemia, unspecified: Secondary | ICD-10-CM

## 2018-11-19 DIAGNOSIS — Z131 Encounter for screening for diabetes mellitus: Secondary | ICD-10-CM

## 2018-11-19 DIAGNOSIS — I10 Essential (primary) hypertension: Secondary | ICD-10-CM

## 2018-11-20 LAB — COMPREHENSIVE METABOLIC PANEL
ALT: 25 IU/L (ref 0–44)
AST: 35 IU/L (ref 0–40)
Albumin/Globulin Ratio: 1.5 (ref 1.2–2.2)
Albumin: 4.2 g/dL (ref 3.8–4.9)
Alkaline Phosphatase: 71 IU/L (ref 39–117)
BUN/Creatinine Ratio: 18 (ref 9–20)
BUN: 23 mg/dL (ref 6–24)
Bilirubin Total: 0.6 mg/dL (ref 0.0–1.2)
CO2: 19 mmol/L — ABNORMAL LOW (ref 20–29)
Calcium: 8.9 mg/dL (ref 8.7–10.2)
Chloride: 107 mmol/L — ABNORMAL HIGH (ref 96–106)
Creatinine, Ser: 1.25 mg/dL (ref 0.76–1.27)
GFR calc Af Amer: 72 mL/min/{1.73_m2} (ref >59)
GFR calc non Af Amer: 63 mL/min/{1.73_m2} (ref >59)
Globulin, Total: 2.8 g/dL (ref 1.5–4.5)
Glucose: 95 mg/dL (ref 65–99)
Potassium: 4.6 mmol/L (ref 3.5–5.2)
Sodium: 142 mmol/L (ref 134–144)
Total Protein: 7 g/dL (ref 6.0–8.5)

## 2018-11-20 LAB — CBC WITH DIFFERENTIAL/PLATELET
Basophils Absolute: 0.1 10*3/uL (ref 0.0–0.2)
Basos: 1 %
EOS (ABSOLUTE): 0.2 10*3/uL (ref 0.0–0.4)
Eos: 2 %
Hematocrit: 53.1 % — ABNORMAL HIGH (ref 37.5–51.0)
Hemoglobin: 17.5 g/dL (ref 13.0–17.7)
Immature Grans (Abs): 0 10*3/uL (ref 0.0–0.1)
Immature Granulocytes: 0 %
Lymphocytes Absolute: 2.1 10*3/uL (ref 0.7–3.1)
Lymphs: 29 %
MCH: 27.3 pg (ref 26.6–33.0)
MCHC: 33 g/dL (ref 31.5–35.7)
MCV: 83 fL (ref 79–97)
Monocytes Absolute: 0.7 10*3/uL (ref 0.1–0.9)
Monocytes: 10 %
Neutrophils Absolute: 4.2 10*3/uL (ref 1.4–7.0)
Neutrophils: 58 %
Platelets: 209 10*3/uL (ref 150–450)
RBC: 6.42 x10E6/uL — ABNORMAL HIGH (ref 4.14–5.80)
RDW: 14.6 % (ref 11.6–15.4)
WBC: 7.3 10*3/uL (ref 3.4–10.8)

## 2018-11-20 LAB — LIPID PANEL
Chol/HDL Ratio: 4.5 ratio (ref 0.0–5.0)
Cholesterol, Total: 158 mg/dL (ref 100–199)
HDL: 35 mg/dL — ABNORMAL LOW (ref >39)
LDL Chol Calc (NIH): 106 mg/dL — ABNORMAL HIGH (ref 0–99)
Triglycerides: 89 mg/dL (ref 0–149)
VLDL Cholesterol Cal: 17 mg/dL (ref 5–40)

## 2018-11-20 LAB — HIV ANTIBODY (ROUTINE TESTING W REFLEX): HIV Screen 4th Generation wRfx: NONREACTIVE

## 2018-11-20 LAB — HGB A1C W/O EAG: Hgb A1c MFr Bld: 5.5 % (ref 4.8–5.6)

## 2018-12-18 DIAGNOSIS — R7989 Other specified abnormal findings of blood chemistry: Secondary | ICD-10-CM | POA: Diagnosis not present

## 2018-12-24 ENCOUNTER — Other Ambulatory Visit: Payer: Self-pay | Admitting: *Deleted

## 2018-12-24 DIAGNOSIS — R7989 Other specified abnormal findings of blood chemistry: Secondary | ICD-10-CM

## 2019-01-01 ENCOUNTER — Other Ambulatory Visit: Payer: Self-pay

## 2019-01-01 DIAGNOSIS — R7989 Other specified abnormal findings of blood chemistry: Secondary | ICD-10-CM

## 2019-01-03 LAB — ESTRADIOL: Estradiol: 31.8 pg/mL (ref 7.6–42.6)

## 2019-01-03 LAB — PSA, SERUM (SERIAL MONITOR): Prostate Specific Ag, Serum: 1.2 ng/mL (ref 0.0–4.0)

## 2019-01-03 LAB — TESTOSTERONE,FREE AND TOTAL
Testosterone, Free: 9 pg/mL (ref 7.2–24.0)
Testosterone: 331 ng/dL (ref 264–916)

## 2019-01-03 LAB — HEMATOCRIT: Hematocrit: 52.2 % — ABNORMAL HIGH (ref 37.5–51.0)

## 2019-02-12 ENCOUNTER — Telehealth: Payer: Self-pay | Admitting: *Deleted

## 2019-02-12 DIAGNOSIS — R52 Pain, unspecified: Secondary | ICD-10-CM | POA: Diagnosis not present

## 2019-02-12 NOTE — Telephone Encounter (Signed)
Seth Meadows called the Seth Meadows and Seth Meadows this morning with concerns a needle broke off in his hip. He states he was giving himself an injection "in my hip so I couldn't see, I heard a snap and I can't find the needle". Upon further questioning, when asked what was on the syringe after he injected and pulled it out and looked at the syringe, he states "nothing, and I cannot find the needle". Seth Meadows he needs to contact his PCP this morning for an appt today; he needs a physical exam of the area in question and possibly an xray. He verbalizes understanding and states he will contact his PCP.

## 2019-03-19 DIAGNOSIS — Z20828 Contact with and (suspected) exposure to other viral communicable diseases: Secondary | ICD-10-CM | POA: Diagnosis not present

## 2019-03-31 DIAGNOSIS — X32XXXD Exposure to sunlight, subsequent encounter: Secondary | ICD-10-CM | POA: Diagnosis not present

## 2019-03-31 DIAGNOSIS — L820 Inflamed seborrheic keratosis: Secondary | ICD-10-CM | POA: Diagnosis not present

## 2019-03-31 DIAGNOSIS — L570 Actinic keratosis: Secondary | ICD-10-CM | POA: Diagnosis not present

## 2019-03-31 DIAGNOSIS — L918 Other hypertrophic disorders of the skin: Secondary | ICD-10-CM | POA: Diagnosis not present

## 2019-03-31 DIAGNOSIS — Z1283 Encounter for screening for malignant neoplasm of skin: Secondary | ICD-10-CM | POA: Diagnosis not present

## 2019-04-15 ENCOUNTER — Ambulatory Visit: Payer: BC Managed Care – PPO | Admitting: Family Medicine

## 2019-04-25 ENCOUNTER — Ambulatory Visit: Payer: Self-pay | Admitting: Family Medicine

## 2019-05-15 ENCOUNTER — Other Ambulatory Visit: Payer: Self-pay | Admitting: Family Medicine

## 2019-05-15 DIAGNOSIS — I10 Essential (primary) hypertension: Secondary | ICD-10-CM

## 2019-05-16 NOTE — Telephone Encounter (Signed)
Pt informed script has been called in. Pt sch'd appt

## 2019-05-27 ENCOUNTER — Encounter: Payer: Self-pay | Admitting: Family Medicine

## 2019-05-27 ENCOUNTER — Other Ambulatory Visit: Payer: Self-pay

## 2019-05-27 ENCOUNTER — Ambulatory Visit: Payer: BC Managed Care – PPO | Admitting: Family Medicine

## 2019-05-27 VITALS — BP 138/92 | HR 83 | Temp 97.3°F | Resp 16 | Ht 72.0 in | Wt 259.3 lb

## 2019-05-27 DIAGNOSIS — G4733 Obstructive sleep apnea (adult) (pediatric): Secondary | ICD-10-CM

## 2019-05-27 DIAGNOSIS — E291 Testicular hypofunction: Secondary | ICD-10-CM

## 2019-05-27 DIAGNOSIS — M545 Low back pain, unspecified: Secondary | ICD-10-CM

## 2019-05-27 DIAGNOSIS — R229 Localized swelling, mass and lump, unspecified: Secondary | ICD-10-CM

## 2019-05-27 DIAGNOSIS — I10 Essential (primary) hypertension: Secondary | ICD-10-CM | POA: Diagnosis not present

## 2019-05-27 DIAGNOSIS — Z9989 Dependence on other enabling machines and devices: Secondary | ICD-10-CM

## 2019-05-27 DIAGNOSIS — E785 Hyperlipidemia, unspecified: Secondary | ICD-10-CM | POA: Diagnosis not present

## 2019-05-27 DIAGNOSIS — B351 Tinea unguium: Secondary | ICD-10-CM

## 2019-05-27 DIAGNOSIS — R42 Dizziness and giddiness: Secondary | ICD-10-CM

## 2019-05-27 MED ORDER — METAXALONE 800 MG PO TABS: 800.0000 mg | ORAL_TABLET | Freq: Three times a day (TID) | ORAL | 0 refills | Status: DC

## 2019-05-27 MED ORDER — AMLODIPINE BESYLATE-VALSARTAN 5-160 MG PO TABS: 1.0000 | ORAL_TABLET | Freq: Every day | ORAL | 1 refills | Status: DC

## 2019-05-27 MED ORDER — TERBINAFINE HCL 250 MG PO TABS: 250.0000 mg | ORAL_TABLET | Freq: Every day | ORAL | 0 refills | Status: DC

## 2019-05-27 NOTE — Progress Notes (Signed)
Name: Seth Meadows   MRN: [id number]    DOB: 1960-12-04   Date:05/27/2019       Progress Note  Subjective  Chief Complaint  Chief Complaint  Patient presents with  . Medication Refill    States BP medication makes him feel bloated and dizzy at times-Had covid beginning of March and was very fatigue for about 2-3 weeks after  . Dyslipidemia  . Hypogonadism  . Morbid obesity  . Back Pain    Started today-sharp shooting pain in his lower back (thinks it is a pinch nerve)    HPI  Morbid obesity: he has a BMI of above 35 ,but doing better, lost 3 lbs since last year. He has OSA not wearing CPAP , he has dyslipidemia and HTN  OSA: he states he has a very old machine and would like to have a new machine.   Dyslipidemia:low HDL, he eats enough fish weekly but will increase tree nuts, he is just resuming physical activity since he was sick with covid  Hypogonadism: under the care of Urologist, last PSA was done 12/2018 and normal, HCT is slightly higher than prior to initiation of therapy, he is using injectable testosterone and also takes sildenafil. Denies acne or change in behavior. He feels like it helps with energy, he is not sure how it affects the ED   Hyperglycemia: last A1C was 5.5% He denies polyphagia, polydipsia or polyuria   HTN: he is now on Exforge 5-160 mg since Fall 2021, previously on Norvasc. He states he has episodes of dizziness when he goes up a flight or stairs, usually happens right after he goes up the stairs ( like 3-4 regular flights of stairs), he has to sit down or lean on something to rest. He states resolves within secondary. Not associated with nausea, vomiting, diaphoresis. He is not sure if chest tightness, but no sob , feels like the room is turning grey. He had a stress test back in 2015 when he developed new onset HTN. He had COVID-19 but symptoms started prior to COVID-19   Onychomycosis: going on for years, brittle , cuts his socks and thick    Intermittent low back pain: he has pain on his back on different locations, it happens once every couple of months and lasts a few days. Discussed chiropractor in the past   Mass on upper back: going on for years, no-tender , no drainage   Patient Active Problem List   Diagnosis Date Noted  . OSA on CPAP 02/28/2016  . Vitamin D insufficiency 08/31/2015  . Dyslipidemia 03/01/2015  . Erectile dysfunction of organic origin 10/29/2014  . Arthritis of lumbar spine 10/22/2014  . Intermittent low back pain 09/21/2014  . Blood glucose elevated 08/17/2014  . Hypogonadism in male 07/30/2014  . Hypertension     Past Surgical History:  Procedure Laterality Date  . EYELID LACERATION REPAIR  01/2017   cut the the eye lid to drain the infection out  . WISDOM TOOTH EXTRACTION      Family History  Problem Relation Age of Onset  . Hypertension Mother   . Diabetes Mother   . Hypertension Father   . Prostate cancer Father   . Thyroid disease Father   . Skin cancer Sister   . Diabetes Brother   . Hypertension Brother   . Kidney disease Neg Hx   . Bladder Cancer Neg Hx     Social History   Tobacco Use  . Smoking status: Never Smoker  .  Smokeless tobacco: Never Used  Substance Use Topics  . Alcohol use: No    Comment: occasional     Current Outpatient Medications:  .  amLODipine-valsartan (EXFORGE) 5-160 MG tablet, Take 1 tablet by mouth once daily, Disp: 30 tablet, Rfl: 0 .  naproxen (NAPROSYN) 500 MG tablet, Take 1 tablet (500 mg total) by mouth 2 (two) times daily with a meal., Disp: 30 tablet, Rfl: 0 .  sildenafil (REVATIO) 20 MG tablet, TAKE 1 TO 5 TABLETS BY MOUTH AS NEEDED FOR ERECTILE DYSFUNCTION, Disp: , Rfl:  .  SYRINGE-NEEDLE, DISP, 3 ML (MONOJECT SAFETY SYRINGE/SHIELD) 23G X 1" 3 ML MISC, Inject 29mL intramuscular every 10days, Disp: , Rfl:  .  testosterone cypionate (DEPOTESTOSTERONE CYPIONATE) 200 MG/ML injection, , Disp: , Rfl: 0  No Known Allergies  I personally  reviewed active problem list, medication list, allergies, family history, social history, health maintenance with the patient/caregiver today.   ROS  Constitutional: Negative for fever or weight change.  Respiratory: Negative for cough and shortness of breath.   Cardiovascular: Negative for chest pain or palpitations.  Gastrointestinal: Negative for abdominal pain, no bowel changes.  Musculoskeletal: Negative for gait problem or joint swelling.  Skin: Negative for rash.  Neurological: positive  For intermittent  dizziness but no  headache.  No other specific complaints in a complete review of systems (except as listed in HPI above).  Objective  Vitals:   05/27/19 1527  BP: (!) 138/92  Pulse: 83  Resp: 16  Temp: (!) 97.3 F (36.3 C)  TempSrc: Temporal  SpO2: 96%  Weight: 259 lb 4.8 oz (117.6 kg)  Height: 6' (1.829 m)    Body mass index is 35.17 kg/m.  Physical Exam  Constitutional: Patient appears well-developed and well-nourished. Obese  No distress.  HEENT: head atraumatic, normocephalic, pupils equal and reactive to light Cardiovascular: Normal rate, regular rhythm and normal heart sounds.  No murmur heard. No BLE edema. Pulmonary/Chest: Effort normal and breath sounds normal. No respiratory distress. Abdominal: Soft.  There is no tenderness. Psychiatric: Patient has a normal mood and affect. behavior is normal. Judgment and thought content normal. Skin: thick, brittle first right toe nail, going on for years , 3x3 cm mass on upper back, soft, possible cyst   PHQ2/9: Depression screen [facility] 2/9 05/27/2019 10/16/2018 04/09/2018 10/12/2017 03/08/2017  Decreased Interest 0 0 0 0 0  Down, Depressed, Hopeless 0 0 0 0 0  PHQ - 2 Score 0 0 0 0 0  Altered sleeping 0 1 0 0 -  Tired, decreased energy 0 0 0 0 -  Change in appetite 0 0 0 0 -  Feeling bad or failure about yourself  0 0 0 0 -  Trouble concentrating 0 0 0 0 -  Moving slowly or fidgety/restless 0 0 0 0 -  Suicidal  thoughts 0 0 0 0 -  PHQ-9 Score 0 1 0 0 -  Difficult doing work/chores Not difficult at all Not difficult at all Not difficult at all Not difficult at all -    phq 9 is negative   Fall Risk: Fall Risk  05/27/2019 10/16/2018 04/09/2018 10/12/2017 03/08/2017  Falls in the past year? 0 0 0 No No  Number falls in past yr: 0 0 0 - -  Injury with Fall? 0 0 0 - -  Follow up - - Falls evaluation completed - -    Functional Status Survey: Is the patient deaf or have difficulty hearing?: No Does the patient have  difficulty seeing, even when wearing glasses/contacts?: No Does the patient have difficulty concentrating, remembering, or making decisions?: No Does the patient have difficulty walking or climbing stairs?: No Does the patient have difficulty dressing or bathing?: No Does the patient have difficulty doing errands alone such as visiting a doctor's office or shopping?: No    Assessment & Plan  1. Essential hypertension  - Ambulatory referral to Cardiology - amLODipine-valsartan (EXFORGE) 5-160 MG tablet; Take 1 tablet by mouth daily.  Dispense: 90 tablet; Refill: 1  2. Dyslipidemia  Not on medication   3. Morbid obesity ([facility])  BMI above 35 with co-morbidities - OSA, HTN, dyslipidemia  4. Hypogonadism in male  Seeing Urologist   5. Dizzinesses  - Ambulatory referral to Cardiology  6. OSA on CPAP  - Ambulatory referral to Sleep Studies  7. Fungal infection of toenail  - terbinafine (LAMISIL) 250 MG tablet; Take 1 tablet (250 mg total) by mouth daily.  Dispense: 90 tablet; Refill: 0 - Hepatic function panel - Comprehensive metabolic panel   8. Intermittent low back pain  - metaxalone (SKELAXIN) 800 MG tablet; Take 1 tablet (800 mg total) by mouth 3 (three) times daily.  Dispense: 90 tablet; Refill: 0   9. Mass of skin  Likely a cyst, advised to monitor and let me know if it grows or becomes painful

## 2019-05-30 ENCOUNTER — Other Ambulatory Visit: Payer: Self-pay

## 2019-05-30 ENCOUNTER — Ambulatory Visit (INDEPENDENT_AMBULATORY_CARE_PROVIDER_SITE_OTHER): Payer: BC Managed Care – PPO | Admitting: Cardiology

## 2019-05-30 ENCOUNTER — Encounter: Payer: Self-pay | Admitting: Cardiology

## 2019-05-30 VITALS — BP 148/102 | HR 69 | Ht 72.0 in | Wt 254.5 lb

## 2019-05-30 DIAGNOSIS — I10 Essential (primary) hypertension: Secondary | ICD-10-CM | POA: Diagnosis not present

## 2019-05-30 DIAGNOSIS — R42 Dizziness and giddiness: Secondary | ICD-10-CM

## 2019-05-30 MED ORDER — VALSARTAN 160 MG PO TABS: 160.0000 mg | ORAL_TABLET | Freq: Every day | ORAL | 6 refills | Status: DC

## 2019-05-30 MED ORDER — AMLODIPINE BESYLATE 2.5 MG PO TABS
2.5000 mg | ORAL_TABLET | Freq: Every day | ORAL | 6 refills | Status: DC
Start: 2019-05-30 — End: 2019-06-30

## 2019-05-30 NOTE — Progress Notes (Signed)
Cardiology Office Note:    Date:  05/30/2019   ID:  Seth Meadows, DOB 22-Apr-1960, MRN [id number]  PCP:  Steele Sizer, MD  Cardiologist:  Kate Sable, MD  Electrophysiologist:  None   Referring MD: Steele Sizer, MD   Chief Complaint  Patient presents with  . OTHER    HTN/Dizziness. Meds reviewed verbally with pt.   Seth Meadows is a 59 y.o. male who is being seen today for the evaluation of hypertension at the request of Steele Sizer, MD.   History of Present Illness:    Seth Meadows is a 59 y.o. male with a hx of hypertension, OSA on CPAP who presents due to elevated blood pressures and dizziness.  Patient states feeling dizzy usually when he goes for flights of stairs and gets to the top.  He has felt this way ever since he started taking blood pressure medications 5 years ago.  He currently takes valsartan 160, amlodipine 5 mg daily.  Been taking current hypertension meds for the past 8 months.  He is usually okay when walking on level ground.  He occasionally has mild dizziness when he bends down to tie his shoelace and raises up too quickly but not as severe as going up 4 flights of stairs.  He is okay when he takes the elevator.  His blood pressures at home usually [id number] to [id number] systolic.  Denies passing out, edema, chest pain, shortness of breath.  He exercises frequently on an elliptical for about 30 to 45 minutes without any symptoms.  He feels that his symptoms of dizziness are worse when ever he takes sildenafil.  Takes testosterone supplements once a week.  Past Medical History:  Diagnosis Date  . Allergic rhinitis   . BPH with obstruction/lower urinary tract symptoms   . Cutaneous eruption   . Earache   . Epididymitis, left   . Hyperglycemia   . Hypertension   . Hypogonadism in male   . Kidney function test abnormal   . Overweight   . Scrotal pain   . Sleep apnea     Past Surgical History:  Procedure Laterality Date  . EYELID LACERATION REPAIR  01/2017    cut the the eye lid to drain the infection out  . WISDOM TOOTH EXTRACTION      Current Medications: Current Meds  Medication Sig  . metaxalone (SKELAXIN) 800 MG tablet Take 1 tablet (800 mg total) by mouth 3 (three) times daily.  . naproxen (NAPROSYN) 500 MG tablet Take 1 tablet (500 mg total) by mouth 2 (two) times daily with a meal.  . sildenafil (REVATIO) 20 MG tablet TAKE 1 TO 5 TABLETS BY MOUTH AS NEEDED FOR ERECTILE DYSFUNCTION  . SYRINGE-NEEDLE, DISP, 3 ML (MONOJECT SAFETY SYRINGE/SHIELD) 23G X 1" 3 ML MISC Inject 91mL intramuscular every 10days  . terbinafine (LAMISIL) 250 MG tablet Take 1 tablet (250 mg total) by mouth daily.  Marland Kitchen testosterone cypionate (DEPOTESTOSTERONE CYPIONATE) 200 MG/ML injection   . [DISCONTINUED] amLODipine-valsartan (EXFORGE) 5-160 MG tablet Take 1 tablet by mouth daily.     Allergies:   Patient has no known allergies.   Social History   Socioeconomic History  . Marital status: Married    Spouse name: July   . Number of children: 3  . Years of education: Not on file  . Highest education level: Bachelor's degree (e.g., BA, AB, BS)  Occupational History  . Occupation: Art gallery manager   Tobacco Use  . Smoking status: Never Smoker  . Smokeless  tobacco: Never Used  Substance and Sexual Activity  . Alcohol use: No    Comment: occasional  . Drug use: No  . Sexual activity: Yes    Partners: Female  Other Topics Concern  . Not on file  Social History Narrative   Married, has three children,    Twin daughters still in middle school   Son is going to school at [employer]    He also has a step son    Social Determinants of Radio broadcast assistant Strain: Low Risk   . Difficulty of Paying Living Expenses: Not hard at all  Food Insecurity: No Food Insecurity  . Worried About Charity fundraiser in the Last Year: Never true  . Ran Out of Food in the Last Year: Never true  Transportation Needs: No Transportation Needs  . Lack of Transportation  (Medical): No  . Lack of Transportation (Non-Medical): No  Physical Activity: Sufficiently Active  . Days of Exercise per Week: 4 days  . Minutes of Exercise per Session: 60 min  Stress: No Stress Concern Present  . Feeling of Stress : Not at all  Social Connections: Slightly Isolated  . Frequency of Communication with Friends and Family: More than three times a week  . Frequency of Social Gatherings with Friends and Family: More than three times a week  . Attends Religious Services: More than 4 times per year  . Active Member of Clubs or Organizations: No  . Attends Archivist Meetings: Never  . Marital Status: Married     Family History: The patient's family history includes Diabetes in his brother and mother; Hypertension in his brother, father, and mother; Prostate cancer in his father; Skin cancer in his sister; Thyroid disease in his father. There is no history of Kidney disease or Bladder Cancer.  ROS:   Please see the history of present illness.     All other systems reviewed and are negative.  EKGs/Labs/Other Studies Reviewed:    The following studies were reviewed today:   EKG:  EKG is  ordered today.  The ekg ordered today demonstrates sinus rhythm, normal ECG.  Recent Labs: 11/19/2018: ALT 25; BUN 23; Creatinine, Ser 1.25; Hemoglobin 17.5; Platelets 209; Potassium 4.6; Sodium 142  Recent Lipid Panel    Component Value Date/Time   CHOL 158 11/19/2018 0803   TRIG 89 11/19/2018 0803   HDL 35 (L) 11/19/2018 0803   CHOLHDL 4.5 11/19/2018 0803   CHOLHDL 3.1 03/08/2017 1054   VLDL 12 07/10/2016 0835   LDLCALC 106 (H) 11/19/2018 0803   LDLCALC 61 03/08/2017 1054    Physical Exam:    VS:  BP (!) 148/102 (BP Location: Right Arm, Patient Position: Sitting, Cuff Size: Large)   Pulse 69   Ht 6' (1.829 m)   Wt 254 lb 8 oz (115.4 kg)   SpO2 98%   BMI 34.52 kg/m     Wt Readings from Last 3 Encounters:  05/30/19 254 lb 8 oz (115.4 kg)  05/27/19 259 lb 4.8  oz (117.6 kg)  10/16/18 260 lb 9.6 oz (118.2 kg)     GEN:  Well nourished, well developed in no acute distress HEENT: Normal NECK: No JVD; No carotid bruits LYMPHATICS: No lymphadenopathy CARDIAC: RRR, no murmurs, rubs, gallops RESPIRATORY:  Clear to auscultation without rales, wheezing or rhonchi  ABDOMEN: Soft, non-tender, non-distended MUSCULOSKELETAL:  No edema; No deformity  SKIN: Warm and dry NEUROLOGIC:  Alert and oriented x 3 PSYCHIATRIC:  Normal  affect   ASSESSMENT:    1. Essential hypertension   2. Dizziness    PLAN:    In order of problems listed above:  1. Patient's blood pressure today is slightly elevated, blood pressures are typically normal at home.  We will stop Exforge and start losartan 160 mg daily, amlodipine 2.5 mg daily.  Patient advised to keep a blood pressure log at home.  Testosterone likely making blood pressure control more difficult. 2. Patient with history of dizziness.  Orthostatic vitals in the office today did not reveal evidence for orthostasis.  Etiology is likely multifactorial with competing factors.  He likely has been hypertensive for a while with a" new normal" blood pressure attained.  If blood pressures are overcorrected, might cause some dizziness, especially as patient feels more dizzy when he takes Cialis.  Will decrease amlodipine to 2.5 mg daily.  To see if his symptoms improve.  Follow-up in 1 month.  This note was generated in part or whole with voice recognition software. Voice recognition is usually quitovercorrectede accurate but there are transcription errors that can and very often do occur. I apologize for any typographical errors that were not detected and corrected.  Medication Adjustments/Labs and Tests Ordered: Current medicines are reviewed at length with the patient today.  Concerns regarding medicines are outlined above.  Orders Placed This Encounter  Procedures  . EKG 12-Lead   Meds ordered this encounter    Medications  . valsartan (DIOVAN) 160 MG tablet    Sig: Take 1 tablet (160 mg total) by mouth daily.    Dispense:  30 tablet    Refill:  6  . amLODipine (NORVASC) 2.5 MG tablet    Sig: Take 1 tablet (2.5 mg total) by mouth daily.    Dispense:  30 tablet    Refill:  6    Patient Instructions  Medication Instructions:  - Your physician has recommended you make the following change in your medication:   1) STOP exforge  2) START valsartan 160 mg- take 1 tablet by mouth once daily  3) START amlodipine 2.5 mg- take 1 tablet by mouth once daily  *If you need a refill on your cardiac medications before your next appointment, please call your pharmacy*   Lab Work: - none ordered  If you have labs (blood work) drawn today and your tests are completely normal, you will receive your results only by: Marland Kitchen MyChart Message (if you have MyChart) OR . A paper copy in the mail If you have any lab test that is abnormal or we need to change your treatment, we will call you to review the results.   Testing/Procedures: - none ordered   Follow-Up: At [facility], you and your health needs are our priority.  As part of our continuing mission to provide you with exceptional heart care, we have created designated Provider Care Teams.  These Care Teams include your primary Cardiologist (physician) and Advanced Practice Providers (APPs -  Physician Assistants and Nurse Practitioners) who all work together to provide you with the care you need, when you need it.  We recommend signing up for the patient portal called "MyChart".  Sign up information is provided on this After Visit Summary.  MyChart is used to connect with patients for Virtual Visits (Telemedicine).  Patients are able to view lab/test results, encounter notes, upcoming appointments, etc.  Non-urgent messages can be sent to your provider as well.   To learn more about what you can do with  MyChart, go to [email or web address].    Your  next appointment:   1 month(s)  The format for your next appointment:   In Person  Provider:   Kate Sable, MD   Other Instructions n/a     Signed, Kate Sable, MD  05/30/2019 2:14 PM    [place]

## 2019-05-30 NOTE — Patient Instructions (Signed)
Medication Instructions:  - Your physician has recommended you make the following change in your medication:   1) STOP exforge  2) START valsartan 160 mg- take 1 tablet by mouth once daily  3) START amlodipine 2.5 mg- take 1 tablet by mouth once daily  *If you need a refill on your cardiac medications before your next appointment, please call your pharmacy*   Lab Work: - none ordered  If you have labs (blood work) drawn today and your tests are completely normal, you will receive your results only by: Marland Kitchen MyChart Message (if you have MyChart) OR . A paper copy in the mail If you have any lab test that is abnormal or we need to change your treatment, we will call you to review the results.   Testing/Procedures: - none ordered   Follow-Up: At [facility], you and your health needs are our priority.  As part of our continuing mission to provide you with exceptional heart care, we have created designated Provider Care Teams.  These Care Teams include your primary Cardiologist (physician) and Advanced Practice Providers (APPs -  Physician Assistants and Nurse Practitioners) who all work together to provide you with the care you need, when you need it.  We recommend signing up for the patient portal called "MyChart".  Sign up information is provided on this After Visit Summary.  MyChart is used to connect with patients for Virtual Visits (Telemedicine).  Patients are able to view lab/test results, encounter notes, upcoming appointments, etc.  Non-urgent messages can be sent to your provider as well.   To learn more about what you can do with MyChart, go to [email or web address].    Your next appointment:   1 month(s)  The format for your next appointment:   In Person  Provider:   Kate Sable, MD   Other Instructions n/a

## 2019-06-02 ENCOUNTER — Other Ambulatory Visit: Payer: Self-pay

## 2019-06-02 DIAGNOSIS — I10 Essential (primary) hypertension: Secondary | ICD-10-CM

## 2019-06-03 ENCOUNTER — Other Ambulatory Visit: Payer: Self-pay

## 2019-06-03 DIAGNOSIS — I10 Essential (primary) hypertension: Secondary | ICD-10-CM

## 2019-06-04 LAB — COMPREHENSIVE METABOLIC PANEL
ALT: 27 IU/L (ref 0–44)
AST: 36 IU/L (ref 0–40)
Albumin/Globulin Ratio: 1.3 (ref 1.2–2.2)
Albumin: 3.8 g/dL (ref 3.8–4.9)
Alkaline Phosphatase: 64 IU/L (ref 39–117)
BUN/Creatinine Ratio: 15 (ref 10–24)
BUN: 18 mg/dL (ref 8–27)
Bilirubin Total: 0.5 mg/dL (ref 0.0–1.2)
CO2: 23 mmol/L (ref 20–29)
Calcium: 8.9 mg/dL (ref 8.6–10.2)
Chloride: 104 mmol/L (ref 96–106)
Creatinine, Ser: 1.21 mg/dL (ref 0.76–1.27)
GFR calc Af Amer: 75 mL/min/{1.73_m2} (ref >59)
GFR calc non Af Amer: 65 mL/min/{1.73_m2} (ref >59)
Globulin, Total: 2.9 g/dL (ref 1.5–4.5)
Glucose: 97 mg/dL (ref 65–99)
Potassium: 4.5 mmol/L (ref 3.5–5.2)
Sodium: 139 mmol/L (ref 134–144)
Total Protein: 6.7 g/dL (ref 6.0–8.5)

## 2019-06-30 ENCOUNTER — Ambulatory Visit: Payer: BC Managed Care – PPO | Admitting: Cardiology

## 2019-06-30 ENCOUNTER — Encounter: Payer: Self-pay | Admitting: Cardiology

## 2019-06-30 ENCOUNTER — Other Ambulatory Visit: Payer: Self-pay

## 2019-06-30 VITALS — BP 160/102 | HR 70 | Ht 73.0 in | Wt 253.2 lb

## 2019-06-30 DIAGNOSIS — R42 Dizziness and giddiness: Secondary | ICD-10-CM | POA: Diagnosis not present

## 2019-06-30 DIAGNOSIS — I10 Essential (primary) hypertension: Secondary | ICD-10-CM

## 2019-06-30 MED ORDER — VALSARTAN 160 MG PO TABS: 160.0000 mg | ORAL_TABLET | Freq: Two times a day (BID) | ORAL | 6 refills | Status: DC

## 2019-06-30 NOTE — Patient Instructions (Signed)
Medication Instructions:   1.  STOP taking your Amlodipine (Norvasc).  2.  INCREASE your Valsartan (Diovan) 160mg , to one tablet by mouth twice a day.   *If you need a refill on your cardiac medications before your next appointment, please call your pharmacy*   Lab Work: None Ordered. If you have labs (blood work) drawn today and your tests are completely normal, you will receive your results only by: Marland Kitchen MyChart Message (if you have MyChart) OR . A paper copy in the mail If you have any lab test that is abnormal or we need to change your treatment, we will call you to review the results.   Testing/Procedures: None Ordered.   Follow-Up: At [facility], you and your health needs are our priority.  As part of our continuing mission to provide you with exceptional heart care, we have created designated Provider Care Teams.  These Care Teams include your primary Cardiologist (physician) and Advanced Practice Providers (APPs -  Physician Assistants and Nurse Practitioners) who all work together to provide you with the care you need, when you need it.  We recommend signing up for the patient portal called "MyChart".  Sign up information is provided on this After Visit Summary.  MyChart is used to connect with patients for Virtual Visits (Telemedicine).  Patients are able to view lab/test results, encounter notes, upcoming appointments, etc.  Non-urgent messages can be sent to your provider as well.   To learn more about what you can do with MyChart, go to [email or web address].    Your next appointment:   1 month(s)  The format for your next appointment:   In Person  Provider:   Kate Sable, MD   Other Instructions N/A

## 2019-06-30 NOTE — Progress Notes (Signed)
Cardiology Office Note:    Date:  06/30/2019   ID:  Seth Meadows, DOB Nov 26, 1960, MRN [id number]  PCP:  Steele Sizer, MD  Cardiologist:  Kate Sable, MD  Electrophysiologist:  None   Referring MD: Steele Sizer, MD   Chief Complaint  Patient presents with  . OTHER    1 month f/u no complaints today. Meds reviewed verbally with pt.    History of Present Illness:    Seth Meadows is a 59 y.o. male with a hx of hypertension, hypogonadism, OSA on CPAP who presents for follow-up.  He was last seen due to elevated blood pressures and dizziness.  His dizziness usually occurs when he is going up a flight of stairs.  He feels well walking on level ground.  Denies edema.  Amlodipine was decreased to 2.5 after last visit.  He states his symptoms of dizziness have improved significantly since decreasing amlodipine.  He has not checked his blood pressure at home for the past week or so due to his blood pressure machine not working.  When he last checked about 10 days ago, systolic was in the 295J.   Past Medical History:  Diagnosis Date  . Allergic rhinitis   . BPH with obstruction/lower urinary tract symptoms   . Cutaneous eruption   . Earache   . Epididymitis, left   . Hyperglycemia   . Hypertension   . Hypogonadism in male   . Kidney function test abnormal   . Overweight   . Scrotal pain   . Sleep apnea     Past Surgical History:  Procedure Laterality Date  . EYELID LACERATION REPAIR  01/2017   cut the the eye lid to drain the infection out  . WISDOM TOOTH EXTRACTION      Current Medications: Current Meds  Medication Sig  . metaxalone (SKELAXIN) 800 MG tablet Take 1 tablet (800 mg total) by mouth 3 (three) times daily.  . naproxen (NAPROSYN) 500 MG tablet Take 1 tablet (500 mg total) by mouth 2 (two) times daily with a meal.  . sildenafil (REVATIO) 20 MG tablet TAKE 1 TO 5 TABLETS BY MOUTH AS NEEDED FOR ERECTILE DYSFUNCTION  . SYRINGE-NEEDLE, DISP, 3 ML (MONOJECT  SAFETY SYRINGE/SHIELD) 23G X 1" 3 ML MISC Inject 69mL intramuscular every 10days  . terbinafine (LAMISIL) 250 MG tablet Take 1 tablet (250 mg total) by mouth daily.  Marland Kitchen testosterone cypionate (DEPOTESTOSTERONE CYPIONATE) 200 MG/ML injection   . valsartan (DIOVAN) 160 MG tablet Take 1 tablet (160 mg total) by mouth 2 (two) times daily.  . [DISCONTINUED] amLODipine (NORVASC) 2.5 MG tablet Take 1 tablet (2.5 mg total) by mouth daily.  . [DISCONTINUED] valsartan (DIOVAN) 160 MG tablet Take 1 tablet (160 mg total) by mouth daily.     Allergies:   Patient has no known allergies.   Social History   Socioeconomic History  . Marital status: Married    Spouse name: July   . Number of children: 3  . Years of education: Not on file  . Highest education level: Bachelor's degree (e.g., BA, AB, BS)  Occupational History  . Occupation: Art gallery manager   Tobacco Use  . Smoking status: Never Smoker  . Smokeless tobacco: Never Used  Substance and Sexual Activity  . Alcohol use: No    Comment: occasional  . Drug use: No  . Sexual activity: Yes    Partners: Female  Other Topics Concern  . Not on file  Social History Narrative   Married, has three  children,    Twin daughters still in middle school   Son is going to school at [employer]    He also has a step son    Social Determinants of Radio broadcast assistant Strain: Low Risk   . Difficulty of Paying Living Expenses: Not hard at all  Food Insecurity: No Food Insecurity  . Worried About Charity fundraiser in the Last Year: Never true  . Ran Out of Food in the Last Year: Never true  Transportation Needs: No Transportation Needs  . Lack of Transportation (Medical): No  . Lack of Transportation (Non-Medical): No  Physical Activity: Sufficiently Active  . Days of Exercise per Week: 4 days  . Minutes of Exercise per Session: 60 min  Stress: No Stress Concern Present  . Feeling of Stress : Not at all  Social Connections: Slightly Isolated   . Frequency of Communication with Friends and Family: More than three times a week  . Frequency of Social Gatherings with Friends and Family: More than three times a week  . Attends Religious Services: More than 4 times per year  . Active Member of Clubs or Organizations: No  . Attends Archivist Meetings: Never  . Marital Status: Married     Family History: The patient's family history includes Diabetes in his brother and mother; Hypertension in his brother, father, and mother; Prostate cancer in his father; Skin cancer in his sister; Thyroid disease in his father. There is no history of Kidney disease or Bladder Cancer.  ROS:   Please see the history of present illness.     All other systems reviewed and are negative.  EKGs/Labs/Other Studies Reviewed:    The following studies were reviewed today:   EKG:  EKG is  ordered today.  The ekg ordered today demonstrates sinus rhythm  Recent Labs: 11/19/2018: Hemoglobin 17.5; Platelets 209 06/03/2019: ALT 27; BUN 18; Creatinine, Ser 1.21; Potassium 4.5; Sodium 139  Recent Lipid Panel    Component Value Date/Time   CHOL 158 11/19/2018 0803   TRIG 89 11/19/2018 0803   HDL 35 (L) 11/19/2018 0803   CHOLHDL 4.5 11/19/2018 0803   CHOLHDL 3.1 03/08/2017 1054   VLDL 12 07/10/2016 0835   LDLCALC 106 (H) 11/19/2018 0803   LDLCALC 61 03/08/2017 1054    Physical Exam:    VS:  BP (!) 160/102 (BP Location: Left Arm, Patient Position: Sitting, Cuff Size: Normal)   Pulse 70   Ht 6\' 1"  (1.854 m)   Wt 253 lb 4 oz (114.9 kg)   SpO2 98%   BMI 33.41 kg/m     Wt Readings from Last 3 Encounters:  06/30/19 253 lb 4 oz (114.9 kg)  05/30/19 254 lb 8 oz (115.4 kg)  05/27/19 259 lb 4.8 oz (117.6 kg)     GEN:  Well nourished, well developed in no acute distress HEENT: Normal NECK: No JVD; No carotid bruits LYMPHATICS: No lymphadenopathy CARDIAC: RRR, no murmurs, rubs, gallops RESPIRATORY:  Clear to auscultation without rales,  wheezing or rhonchi  ABDOMEN: Soft, non-tender, non-distended MUSCULOSKELETAL:  No edema; No deformity  SKIN: Warm and dry NEUROLOGIC:  Alert and oriented x 3 PSYCHIATRIC:  Normal affect   ASSESSMENT:    1. Essential hypertension   2. Dizziness    PLAN:    In order of problems listed above:  1. Patient's blood pressure is elevated today.  Increase valsartan to 160 mg twice daily.  Low-salt diet is, frequent blood pressure checks  at home encouraged.  Testosterone obviously contributing to hypertension and making control more difficult. 2. Patient with history of dizziness with BP meds.  Symptoms have since improved since decreasing dose of amlodipine.  Control blood pressure as indicated above.  Prior orthostatic vitals were negative for orthostasis.  Follow-up in 1 month.  This note was generated in part or whole with voice recognition software. Voice recognition is usually quitovercorrectede accurate but there are transcription errors that can and very often do occur. I apologize for any typographical errors that were not detected and corrected.  Medication Adjustments/Labs and Tests Ordered: Current medicines are reviewed at length with the patient today.  Concerns regarding medicines are outlined above.  No orders of the defined types were placed in this encounter.  Meds ordered this encounter  Medications  . valsartan (DIOVAN) 160 MG tablet    Sig: Take 1 tablet (160 mg total) by mouth 2 (two) times daily.    Dispense:  60 tablet    Refill:  6    Patient Instructions  Medication Instructions:   1.  STOP taking your Amlodipine (Norvasc).  2.  INCREASE your Valsartan (Diovan) 160mg , to one tablet by mouth twice a day.   *If you need a refill on your cardiac medications before your next appointment, please call your pharmacy*   Lab Work: None Ordered. If you have labs (blood work) drawn today and your tests are completely normal, you will receive your results only by:  Marland Kitchen MyChart Message (if you have MyChart) OR . A paper copy in the mail If you have any lab test that is abnormal or we need to change your treatment, we will call you to review the results.   Testing/Procedures: None Ordered.   Follow-Up: At [facility], you and your health needs are our priority.  As part of our continuing mission to provide you with exceptional heart care, we have created designated Provider Care Teams.  These Care Teams include your primary Cardiologist (physician) and Advanced Practice Providers (APPs -  Physician Assistants and Nurse Practitioners) who all work together to provide you with the care you need, when you need it.  We recommend signing up for the patient portal called "MyChart".  Sign up information is provided on this After Visit Summary.  MyChart is used to connect with patients for Virtual Visits (Telemedicine).  Patients are able to view lab/test results, encounter notes, upcoming appointments, etc.  Non-urgent messages can be sent to your provider as well.   To learn more about what you can do with MyChart, go to [email or web address].    Your next appointment:   1 month(s)  The format for your next appointment:   In Person  Provider:   Kate Sable, MD   Other Instructions N/A     Signed, Kate Sable, MD  06/30/2019 12:46 PM    [place]

## 2019-07-02 NOTE — Addendum Note (Signed)
Addended by: Britt Bottom on: 07/02/2019 07:37 AM   Modules accepted: Orders

## 2019-08-04 ENCOUNTER — Ambulatory Visit: Payer: BC Managed Care – PPO | Admitting: Cardiology

## 2019-08-25 ENCOUNTER — Ambulatory Visit: Payer: BC Managed Care – PPO | Admitting: Cardiology

## 2019-09-04 ENCOUNTER — Other Ambulatory Visit: Payer: Self-pay | Admitting: Family Medicine

## 2019-09-04 DIAGNOSIS — B351 Tinea unguium: Secondary | ICD-10-CM

## 2019-09-04 NOTE — Telephone Encounter (Signed)
Requested medication (s) are due for refill today: no  Requested medication (s) are on the active medication list: yes  Last refill:  05/27/2019  Future visit scheduled: yes  Notes to clinic: Medication not assigned to a protocol, review manually   Requested Prescriptions  Pending Prescriptions Disp Refills   terbinafine (LAMISIL) 250 MG tablet [Pharmacy Med Name: Terbinafine HCl 250 MG Oral Tablet] 90 tablet 0    Sig: Take 1 tablet by mouth once daily      Off-Protocol Failed - 09/04/2019  7:35 AM      Failed - Medication not assigned to a protocol, review manually.      Passed - Valid encounter within last 12 months    Recent Outpatient Visits           3 months ago Essential hypertension   [facility] Steele Sizer, MD   10 months ago Morbid obesity [facility])   [facility] Steele Sizer, MD   1 year ago Dyslipidemia   [place], FNP   1 year ago Cubital tunnel syndrome on right   [facility] [place] [place]   2 years ago Dyslipidemia   [place], MD       Future Appointments             In 2 months Steele Sizer, MD [facility], [facility]

## 2019-09-18 ENCOUNTER — Other Ambulatory Visit: Payer: Self-pay

## 2019-09-24 ENCOUNTER — Telehealth: Payer: Self-pay | Admitting: Family Medicine

## 2019-09-24 ENCOUNTER — Other Ambulatory Visit: Payer: BC Managed Care – PPO

## 2019-09-24 ENCOUNTER — Other Ambulatory Visit: Payer: Self-pay

## 2019-09-24 DIAGNOSIS — B351 Tinea unguium: Secondary | ICD-10-CM

## 2019-09-24 NOTE — Telephone Encounter (Signed)
Requested  medications are  due for refill today yes  Requested medications are on the active medication list yes  Last refill 5/4  Last visit 05/2019   Notes to clinic Unclear if med is to be continued, please assess.

## 2019-09-25 LAB — HEPATIC FUNCTION PANEL
ALT: 22 IU/L (ref 0–44)
AST: 29 IU/L (ref 0–40)
Albumin: 3.9 g/dL (ref 3.8–4.9)
Alkaline Phosphatase: 57 IU/L (ref 48–121)
Bilirubin Total: 0.5 mg/dL (ref 0.0–1.2)
Bilirubin, Direct: 0.13 mg/dL (ref 0.00–0.40)
Total Protein: 7.1 g/dL (ref 6.0–8.5)

## 2019-09-26 ENCOUNTER — Other Ambulatory Visit: Payer: Self-pay | Admitting: Family Medicine

## 2019-09-26 DIAGNOSIS — B351 Tinea unguium: Secondary | ICD-10-CM

## 2019-09-26 NOTE — Telephone Encounter (Signed)
Pt.notified

## 2019-09-26 NOTE — Telephone Encounter (Signed)
Pt is calling and stated this medication is on going and he has an appt in nov 2021. Please advice

## 2019-10-06 ENCOUNTER — Encounter: Payer: Self-pay | Admitting: Internal Medicine

## 2019-10-06 ENCOUNTER — Ambulatory Visit: Payer: BC Managed Care – PPO | Admitting: Internal Medicine

## 2019-10-06 ENCOUNTER — Other Ambulatory Visit: Payer: Self-pay

## 2019-10-06 VITALS — BP 136/92 | HR 83 | Temp 97.4°F | Resp 16 | Ht 72.0 in | Wt 263.3 lb

## 2019-10-06 DIAGNOSIS — I10 Essential (primary) hypertension: Secondary | ICD-10-CM

## 2019-10-06 DIAGNOSIS — M7989 Other specified soft tissue disorders: Secondary | ICD-10-CM | POA: Insufficient documentation

## 2019-10-06 NOTE — Patient Instructions (Signed)
A referral for an ultrasound was placed today.  Also as we discussed, please follow-up with the cardiologist again as your blood pressure was slightly high today, and they did want to see you back a month after your last visit with them in June after making changes with your blood pressure medication regimen.

## 2019-10-06 NOTE — Progress Notes (Signed)
Patient ID: Seth Meadows, male    DOB: Feb 14, 1960, 59 y.o.   MRN: [id number]  PCP: Steele Sizer, MD  Chief Complaint  Patient presents with  . Cyst    knot on right side of ribs    Subjective:   Seth Meadows is a 59 y.o. male, presents to clinic with CC of the following:  Chief Complaint  Patient presents with  . Cyst    knot on right side of ribs    HPI:  Patient is a 59 year old male patient of Dr. Ancil Boozer Her last visit with him was 05/27/2019 He is currently also followed by cardiology, with their last visit in June, with his blood pressure elevated at that visit, and valsartan increased to 160 mg twice daily.  His amlodipine dose had been decreased previously due to a history of dizziness with his blood pressure meds.  They also noted his testosterone supplementation was contributing to hypertension and making control more difficult.  They wanted a follow-up in 1 month, although I do not see a visit for that. At that visit he was noted to have a mass on his upper back that has been going on for years, was not tender, with no drainage, and felt to be a cyst, recommended monitoring. Follows up today with concerns for a lump on his right chest area.  He notes in the past weeks, he has noticed this lump area on his right lower chest, can be uncomfortable at times if he lays on that side.  Otherwise, denies marked pains with this.  No increased redness or warmth of this area, no drainage. He has been following one on his upper back for some time now. He denied a history of marked hyperlipidemia, with his last check in October 2020 showing a mild elevation in LDL at 106, and HDL slightly low at 35. He does currently do testosterone injections, in the buttocks. Denies any shortness of breath, no abdominal pains, no other symptoms of concern.  Tobacco-never smoker  Patient Active Problem List   Diagnosis Date Noted  . OSA on CPAP 02/28/2016  . Vitamin D insufficiency 08/31/2015   . Dyslipidemia 03/01/2015  . Erectile dysfunction of organic origin 10/29/2014  . Arthritis of lumbar spine 10/22/2014  . Intermittent low back pain 09/21/2014  . Blood glucose elevated 08/17/2014  . Hypogonadism in male 07/30/2014  . Hypertension       Current Outpatient Medications:  .  naproxen (NAPROSYN) 500 MG tablet, Take 1 tablet (500 mg total) by mouth 2 (two) times daily with a meal., Disp: 30 tablet, Rfl: 0 .  sildenafil (REVATIO) 20 MG tablet, TAKE 1 TO 5 TABLETS BY MOUTH AS NEEDED FOR ERECTILE DYSFUNCTION, Disp: , Rfl:  .  SYRINGE-NEEDLE, DISP, 3 ML (MONOJECT SAFETY SYRINGE/SHIELD) 23G X 1" 3 ML MISC, Inject 19mL intramuscular every 10days, Disp: , Rfl:  .  testosterone cypionate (DEPOTESTOSTERONE CYPIONATE) 200 MG/ML injection, , Disp: , Rfl: 0 .  valsartan (DIOVAN) 160 MG tablet, Take 1 tablet (160 mg total) by mouth 2 (two) times daily., Disp: 60 tablet, Rfl: 6   No Known Allergies   Past Surgical History:  Procedure Laterality Date  . EYELID LACERATION REPAIR  01/2017   cut the the eye lid to drain the infection out  . WISDOM TOOTH EXTRACTION       Family History  Problem Relation Age of Onset  . Hypertension Mother   . Diabetes Mother   . Hypertension Father   .  Prostate cancer Father   . Thyroid disease Father   . Skin cancer Sister   . Diabetes Brother   . Hypertension Brother   . Kidney disease Neg Hx   . Bladder Cancer Neg Hx      Social History   Tobacco Use  . Smoking status: Never Smoker  . Smokeless tobacco: Never Used  Substance Use Topics  . Alcohol use: No    Comment: occasional    With staff assistance, above reviewed with the patient today.  ROS: As per HPI, otherwise no specific complaints on a limited and focused system review   No results found for this or any previous visit (from the past 72 hour(s)).   PHQ2/9: Depression screen [facility] 2/9 10/06/2019 05/27/2019 10/16/2018 04/09/2018 10/12/2017  Decreased Interest 0 0 0 0 0   Down, Depressed, Hopeless 0 0 0 0 0  PHQ - 2 Score 0 0 0 0 0  Altered sleeping - 0 1 0 0  Tired, decreased energy - 0 0 0 0  Change in appetite - 0 0 0 0  Feeling bad or failure about yourself  - 0 0 0 0  Trouble concentrating - 0 0 0 0  Moving slowly or fidgety/restless - 0 0 0 0  Suicidal thoughts - 0 0 0 0  PHQ-9 Score - 0 1 0 0  Difficult doing work/chores - Not difficult at all Not difficult at all Not difficult at all Not difficult at all   PHQ-2/9 Result is neg  Fall Risk: Fall Risk  10/06/2019 05/27/2019 10/16/2018 04/09/2018 10/12/2017  Falls in the past year? 0 0 0 0 No  Number falls in past yr: 0 0 0 0 -  Injury with Fall? 0 0 0 0 -  Follow up Falls evaluation completed - - Falls evaluation completed -      Objective:   Vitals:   10/06/19 1005  BP: (!) 136/92  Pulse: 83  Resp: 16  Temp: (!) 97.4 F (36.3 C)  TempSrc: Oral  SpO2: 99%  Weight: 263 lb 4.8 oz (119.4 kg)  Height: 6' (1.829 m)    Body mass index is 35.71 kg/m.  Physical Exam   NAD, masked HEENT - [place]/AT, sclera anicteric, Car - RRR  Pulm- RR and effort normal at rest, CTA without wheeze or rales Chest-in the lower right chest anterior laterally towards the axillary line was a small raised freely movable nodularity that was not firm, slightly more fluctuant with palpating, not markedly tender to palpate, with no overlying erythema, no drainage.  He was nontender palpating the ribs in this region. Abd - soft, NT diffusely, with no right upper quadrant tenderness, ND,  Back -a small freely movable subcutaneous nodularity was present in the upper back which she notes has not changed significantly in the recent past. Neuro/psychiatric - affect was not flat, appropriate with conversation  Alert with normal speech   Results for orders placed or performed in visit on 09/24/19  Hepatic function panel  Result Value Ref Range   Total Protein 7.1 6.0 - 8.5 g/dL   Albumin 3.9 3.8 - 4.9 g/dL   Bilirubin  Total 0.5 0.0 - 1.2 mg/dL   Bilirubin, Direct 0.13 0.00 - 0.40 mg/dL   Alkaline Phosphatase 57 48 - 121 IU/L   AST 29 0 - 40 IU/L   ALT 22 0 - 44 IU/L       Assessment & Plan:   1. Soft tissue mass Discussed with patient this  is likely a mass type lesion involving the subcutaneous tissue, may be cystic, and by description, seems likely benign.  This is similar to the one in his upper back area that he has been monitoring.  He has no history of hyperlipidemia. I did note this could possibly be a lipoma as well. Discussed options, and he did feel like he wanted to get this further assessed with an ultrasound, which does seem reasonable at this point. An ultrasound was ordered. - [place] CHEST SOFT TISSUE; Future  2. Essential hypertension His blood pressure was slightly higher in the office today, and he notes on blood pressure checks at home and has been running in this range as well. I did note cardiology wanted a follow-up after his last visit, about a month after, and he noted he would call them to schedule that follow-up as I noted the importance of blood pressure control presently. He also noted he will make another follow-up with Dr. Ancil Boozer as was recommended previously as well.  Await that ultrasound result presently.     Towanda Malkin, MD 10/06/19 10:14 AM

## 2019-10-09 ENCOUNTER — Other Ambulatory Visit: Payer: Self-pay

## 2019-10-09 ENCOUNTER — Ambulatory Visit
Admission: RE | Admit: 2019-10-09 | Discharge: 2019-10-09 | Disposition: A | Discharge status: Home / Self Care | Payer: BC Managed Care – PPO | Source: Ambulatory Visit | Attending: Internal Medicine | Admitting: Internal Medicine

## 2019-10-09 DIAGNOSIS — M7989 Other specified soft tissue disorders: Secondary | ICD-10-CM | POA: Diagnosis not present

## 2019-10-09 DIAGNOSIS — R222 Localized swelling, mass and lump, trunk: Secondary | ICD-10-CM | POA: Diagnosis not present

## 2019-10-20 DIAGNOSIS — D171 Benign lipomatous neoplasm of skin and subcutaneous tissue of trunk: Secondary | ICD-10-CM | POA: Diagnosis not present

## 2019-10-20 DIAGNOSIS — B078 Other viral warts: Secondary | ICD-10-CM | POA: Diagnosis not present

## 2019-10-21 ENCOUNTER — Telehealth: Payer: Self-pay

## 2019-10-21 NOTE — Telephone Encounter (Signed)
Copied from [place] [id number]. Topic: General - Other >> Oct 16, 2019 11:41 AM Keene Breath wrote: Reason for CRM: Patient called to have his sonogram sent to his dermatologist.  CB# [phone] >> Oct 20, 2019 10:42 AM Yvette Rack wrote: Pt requests that the results be sent to dermatologist Jarvis Newcomer on [facility].

## 2019-10-21 NOTE — Telephone Encounter (Signed)
Disregard. No longer needed. Patient had appointment today. Report was seen on MyChart.

## 2019-10-23 ENCOUNTER — Encounter: Payer: Self-pay | Admitting: Internal Medicine

## 2019-10-23 ENCOUNTER — Other Ambulatory Visit: Payer: Self-pay

## 2019-10-23 ENCOUNTER — Ambulatory Visit: Payer: Self-pay | Admitting: *Deleted

## 2019-10-23 ENCOUNTER — Ambulatory Visit (INDEPENDENT_AMBULATORY_CARE_PROVIDER_SITE_OTHER): Payer: BC Managed Care – PPO | Admitting: Internal Medicine

## 2019-10-23 VITALS — BP 136/94 | HR 85 | Temp 97.5°F | Resp 16 | Ht 72.0 in | Wt 265.8 lb

## 2019-10-23 DIAGNOSIS — I10 Essential (primary) hypertension: Secondary | ICD-10-CM | POA: Diagnosis not present

## 2019-10-23 DIAGNOSIS — M546 Pain in thoracic spine: Secondary | ICD-10-CM

## 2019-10-23 DIAGNOSIS — G5682 Other specified mononeuropathies of left upper limb: Secondary | ICD-10-CM | POA: Diagnosis not present

## 2019-10-23 DIAGNOSIS — M62838 Other muscle spasm: Secondary | ICD-10-CM

## 2019-10-23 MED ORDER — CYCLOBENZAPRINE HCL 10 MG PO TABS: 10.0000 mg | ORAL_TABLET | Freq: Three times a day (TID) | ORAL | 1 refills | Status: DC | PRN

## 2019-10-23 MED ORDER — PREDNISONE 10 MG PO TABS: ORAL_TABLET | ORAL | 0 refills | Status: DC

## 2019-10-23 NOTE — Telephone Encounter (Signed)
Sharp pain in back and arm - present for 2 weeks- got worse last night- pain in left shoulder blade into arm to elbow.Antin inflammatory not helping.  Reason for Disposition . [1] Numbness in an arm or hand (i.e., loss of sensation) AND [2] upper back pain  Answer Assessment - Initial Assessment Questions 1. ONSET: "When did the pain begin?"      2 weeks 2. LOCATION: "Where does it hurt?" (upper, mid or lower back)     Shoulder blade in Left back 3. SEVERITY: "How bad is the pain?"  (e.g., Scale 1-10; mild, moderate, or severe)   - MILD (1-3): doesn't interfere with normal activities    - MODERATE (4-7): interferes with normal activities or awakens from sleep    - SEVERE (8-10): excruciating pain, unable to do any normal activities      7-8 4. PATTERN: "Is the pain constant?" (e.g., yes, no; constant, intermittent)      Comes and goes- mostly bothersome at night 5. RADIATION: "Does the pain shoot into your legs or elsewhere?"     Pain travels in arn to fingers 6. CAUSE:  "What do you think is causing the back pain?"      nerve/muscle 7. BACK OVERUSE:  "Any recent lifting of heavy objects, strenuous work or exercise?"     no 8. MEDICATIONS: "What have you taken so far for the pain?" (e.g., nothing, acetaminophen, NSAIDS)     Anti inflamtory 9. NEUROLOGIC SYMPTOMS: "Do you have any weakness, numbness, or problems with bowel/bladder control?"     Pain to elbow and tingling to fingers-left 10. OTHER SYMPTOMS: "Do you have any other symptoms?" (e.g., fever, abdominal pain, burning with urination, blood in urine)       no 11. PREGNANCY: "Is there any chance you are pregnant?" (e.g., yes, no; LMP)       n/a  Protocols used: BACK PAIN-A-AH

## 2019-10-23 NOTE — Patient Instructions (Signed)
Please call the cardiologist and make a follow-up appointment as they wanted to have 1 month after your last visit with them in June.

## 2019-10-23 NOTE — Progress Notes (Signed)
Patient ID: Seth Meadows, male    DOB: 1960/06/12, 59 y.o.   MRN: [id number]  PCP: Steele Sizer, MD  Chief Complaint  Patient presents with  . Back Pain    left of his spine, pain started to radiated into left arm yesterday while at dentist    Subjective:   Seth Meadows is a 59 y.o. male, presents to clinic with CC of the following:  Chief Complaint  Patient presents with  . Back Pain    left of his spine, pain started to radiated into left arm yesterday while at dentist    HPI:  Patient is a 59 year old male patient of Dr. Ancil Boozer Our last visit was 10/06/2019, with an ultrasound after that visit confirming a benign likely lipoma as the source of the soft tissue mass noted. Presents today with the above complaints  He notes for the past 2 weeks he has been having some left back pain, felt left of the spine and adjacent to the left scapula and at times feels some pain down the left arm.  He notes he has had this in the past, typically goes away after a day or 2.  Did get better after a day or 2 when first started a couple weeks ago, although never completely went away, and then yesterday in the dentist year, it started to become more symptomatic again.  It continued overnight and he struggled sleeping.  He states it feels like a pinched nerve.  He was given a medication to use previously, he thinks is a muscle relaxer and not sure of the name, took that last night, and then overnight took 3 Aleve products to try to help.  Did not sleep much last night, and his symptoms continued today.  No weakness in the left arm, not dropping things.  Denies numbness or tingling in the left arm. No left-sided chest pains or shortness of breath. He has not yet called cardiology for a follow-up visit as requested last time after their visit in June, as they wanted to see him for 1 month follow-up.  His blood pressure was elevated again today, although pain can be contributing to elevations of blood  pressure.  BP Readings from Last 3 Encounters:  10/23/19 (!) 136/94  10/06/19 (!) 136/92  06/30/19 (!) 160/102   His blood pressure was slightly higher in the office last visit and he notes on blood pressure checks at home and has been running in this range as well. I did note cardiology wanted a follow-up after his last visit, about a month after, and he noted he would call them to schedule that follow-up as I noted the importance of blood pressure control presently. The A/P from cardiology visit on 06/30/19 was as follows: 1.  Patient's blood pressure is elevated today.  Increase valsartan to 160 mg twice daily.  Low-salt diet is, frequent blood pressure checks at home encouraged.  Testosterone obviously contributing to hypertension and making control more difficult. 2. Patient with history of dizziness with BP meds.  Symptoms have since improved since decreasing dose of amlodipine.  Control blood pressure as indicated above.  Prior orthostatic vitals were negative for orthostasis. Follow-up in 1 month.  He does have a follow-up with Dr. Ancil Boozer in early November.  Tobacco-never smoker  Patient Active Problem List   Diagnosis Date Noted  . Soft tissue mass 10/06/2019  . OSA on CPAP 02/28/2016  . Vitamin D insufficiency 08/31/2015  . Dyslipidemia 03/01/2015  . Erectile dysfunction  of organic origin 10/29/2014  . Arthritis of lumbar spine 10/22/2014  . Intermittent low back pain 09/21/2014  . Blood glucose elevated 08/17/2014  . Hypogonadism in male 07/30/2014  . Hypertension       Current Outpatient Medications:  .  naproxen (NAPROSYN) 500 MG tablet, Take 1 tablet (500 mg total) by mouth 2 (two) times daily with a meal., Disp: 30 tablet, Rfl: 0 .  sildenafil (REVATIO) 20 MG tablet, TAKE 1 TO 5 TABLETS BY MOUTH AS NEEDED FOR ERECTILE DYSFUNCTION, Disp: , Rfl:  .  SYRINGE-NEEDLE, DISP, 3 ML (MONOJECT SAFETY SYRINGE/SHIELD) 23G X 1" 3 ML MISC, Inject 79mL intramuscular every 10days,  Disp: , Rfl:  .  testosterone cypionate (DEPOTESTOSTERONE CYPIONATE) 200 MG/ML injection, , Disp: , Rfl: 0 .  valsartan (DIOVAN) 160 MG tablet, Take 1 tablet (160 mg total) by mouth 2 (two) times daily., Disp: 60 tablet, Rfl: 6   No Known Allergies   Past Surgical History:  Procedure Laterality Date  . EYELID LACERATION REPAIR  01/2017   cut the the eye lid to drain the infection out  . WISDOM TOOTH EXTRACTION       Family History  Problem Relation Age of Onset  . Hypertension Mother   . Diabetes Mother   . Hypertension Father   . Prostate cancer Father   . Thyroid disease Father   . Skin cancer Sister   . Diabetes Brother   . Hypertension Brother   . Kidney disease Neg Hx   . Bladder Cancer Neg Hx      Social History   Tobacco Use  . Smoking status: Never Smoker  . Smokeless tobacco: Never Used  Substance Use Topics  . Alcohol use: No    Comment: occasional    With staff assistance, above reviewed with the patient today.  ROS: As per HPI, otherwise no specific complaints on a limited and focused system review   No results found for this or any previous visit (from the past 72 hour(s)).   PHQ2/9: Depression screen [facility] 2/9 10/23/2019 10/06/2019 05/27/2019 10/16/2018 04/09/2018  Decreased Interest 0 0 0 0 0  Down, Depressed, Hopeless 0 0 0 0 0  PHQ - 2 Score 0 0 0 0 0  Altered sleeping - - 0 1 0  Tired, decreased energy - - 0 0 0  Change in appetite - - 0 0 0  Feeling bad or failure about yourself  - - 0 0 0  Trouble concentrating - - 0 0 0  Moving slowly or fidgety/restless - - 0 0 0  Suicidal thoughts - - 0 0 0  PHQ-9 Score - - 0 1 0  Difficult doing work/chores - - Not difficult at all Not difficult at all Not difficult at all   PHQ-2/9 Result is neg  Fall Risk: Fall Risk  10/23/2019 10/06/2019 05/27/2019 10/16/2018 04/09/2018  Falls in the past year? - 0 0 0 0  Number falls in past yr: 0 0 0 0 0  Injury with Fall? 0 0 0 0 0  Follow up Falls evaluation  completed Falls evaluation completed - - Falls evaluation completed      Objective:   Vitals:   10/23/19 1441  BP: (!) 136/94  Pulse: 85  Resp: 16  Temp: (!) 97.5 F (36.4 C)  TempSrc: Oral  SpO2: 99%  Weight: 265 lb 12.8 oz (120.6 kg)  Height: 6' (1.829 m)    Body mass index is 36.05 kg/m.  Physical Exam  NAD, masked, pleasant, well developed HEENT - [place]/AT, sclera anicteric, conj - non-inj'ed Neck - supple, nontender over the cervical spine Car -not tachycardic Pulm- RR and effort normal at rest,  Back -nontender with palpation down the thoracic spine, nontender in the infrascapular region with palpation, mildly tender to palpation where he feels this discomfort which is left of the mid and upper thoracic spine towards the infrascapular region,  Skin- no rash noted in this region Ext -he had good motion of his left shoulder, and denied increased pain with forward elevation of the left shoulder, abduction, the Apley scratch test from above or below.  Good strength noted of the left upper extremity including good grip strength.  Sensation intact to light touch in the left upper extremity and hand, pulses adequate distally Neuro/psychiatric - affect was not flat, appropriate with conversation  Alert with normal speech  Results for orders placed or performed in visit on 09/24/19  Hepatic function panel  Result Value Ref Range   Total Protein 7.1 6.0 - 8.5 g/dL   Albumin 3.9 3.8 - 4.9 g/dL   Bilirubin Total 0.5 0.0 - 1.2 mg/dL   Bilirubin, Direct 0.13 0.00 - 0.40 mg/dL   Alkaline Phosphatase 57 48 - 121 IU/L   AST 29 0 - 40 IU/L   ALT 22 0 - 44 IU/L       Assessment & Plan:    1. Acute left-sided thoracic back pain 2. Pinched nerve in shoulder, left 3. Trapezius muscle spasm Do feel he does have a nerve component to a likely paraspinous muscle strain/spasm, with some trap muscle involvement.  He had no trauma prior, and is nontender over the thoracic spine. Do not  feel any concern for a cardiac source. We will add a Flexeril product-can take up to 3 times a day but take mainly at bedtime as may make drowsy.  Not to drive after taking. Also will add a prednisone product-with a rapid wean recommended. Recommended not using higher doses of Aleve as he was doing, but rather using 2 Aleve tabs up to twice daily with food, and not to take routinely as trying to minimize NSAIDs with his blood pressure issues Recommended local measures with contrast therapies, including warmth, followed by some gentle range of motion exercises, and cold after. Activity modifications in the short-term also important.  - cyclobenzaprine (FLEXERIL) 10 MG tablet; Take 1 tablet (10 mg total) by mouth 3 (three) times daily as needed for muscle spasms. Take mainly at bedtime as may make drowsy, not to drive after taking  Dispense: 30 tablet; Refill: 1 - predniSONE (DELTASONE) 10 MG tablet; Take four tabs daily X 2 days, then two tabs daily X 2 days, then one tab daily X 2 days  Dispense: 14 tablet; Refill: 0  4. Essential hypertension Pain can increase blood pressure, although his blood pressure has been higher on recent checks. Had seen cardiology who wanted to follow-up in a month's time after changing his medication, although he has not done so to date. Asked again today that he call cardiology to schedule that follow-up and the importance of that emphasized, and he stated he would do so. He also has a follow-up with Dr. Ancil Boozer, his PCP scheduled as noted above.  And follow-up if his symptoms are not improving or more problematic despite the above.    Towanda Malkin, MD 10/23/19 2:50 PM

## 2019-11-10 ENCOUNTER — Other Ambulatory Visit: Payer: Self-pay

## 2019-11-10 ENCOUNTER — Other Ambulatory Visit: Payer: BC Managed Care – PPO

## 2019-11-10 DIAGNOSIS — R7989 Other specified abnormal findings of blood chemistry: Secondary | ICD-10-CM

## 2019-11-11 LAB — CBC WITH DIFFERENTIAL/PLATELET
Basophils Absolute: 0.1 10*3/uL (ref 0.0–0.2)
Basos: 1 %
EOS (ABSOLUTE): 0.2 10*3/uL (ref 0.0–0.4)
Eos: 3 %
Hematocrit: 55.5 % — ABNORMAL HIGH (ref 37.5–51.0)
Hemoglobin: 18.4 g/dL — ABNORMAL HIGH (ref 13.0–17.7)
Immature Grans (Abs): 0 10*3/uL (ref 0.0–0.1)
Immature Granulocytes: 0 %
Lymphocytes Absolute: 1.7 10*3/uL (ref 0.7–3.1)
Lymphs: 25 %
MCH: 28.1 pg (ref 26.6–33.0)
MCHC: 33.2 g/dL (ref 31.5–35.7)
MCV: 85 fL (ref 79–97)
Monocytes Absolute: 0.7 10*3/uL (ref 0.1–0.9)
Monocytes: 10 %
Neutrophils Absolute: 4.2 10*3/uL (ref 1.4–7.0)
Neutrophils: 61 %
Platelets: 193 10*3/uL (ref 150–450)
RBC: 6.54 x10E6/uL — ABNORMAL HIGH (ref 4.14–5.80)
RDW: 15.1 % (ref 11.6–15.4)
WBC: 6.8 10*3/uL (ref 3.4–10.8)

## 2019-11-11 LAB — TESTOSTERONE: Testosterone: 848 ng/dL (ref 264–916)

## 2019-11-11 LAB — PSA, SERUM (SERIAL MONITOR): Prostate Specific Ag, Serum: 2.2 ng/mL (ref 0.0–4.0)

## 2019-11-11 LAB — ESTRADIOL: Estradiol: 72.3 pg/mL — ABNORMAL HIGH (ref 7.6–42.6)

## 2019-11-14 DIAGNOSIS — N529 Male erectile dysfunction, unspecified: Secondary | ICD-10-CM | POA: Diagnosis not present

## 2019-11-14 DIAGNOSIS — R7989 Other specified abnormal findings of blood chemistry: Secondary | ICD-10-CM | POA: Diagnosis not present

## 2019-11-14 DIAGNOSIS — D751 Secondary polycythemia: Secondary | ICD-10-CM | POA: Diagnosis not present

## 2019-11-28 ENCOUNTER — Ambulatory Visit: Payer: BC Managed Care – PPO | Admitting: Family Medicine

## 2019-12-10 ENCOUNTER — Other Ambulatory Visit: Payer: Self-pay

## 2019-12-12 ENCOUNTER — Encounter: Payer: Self-pay | Admitting: Cardiology

## 2019-12-12 ENCOUNTER — Ambulatory Visit: Payer: BC Managed Care – PPO | Admitting: Cardiology

## 2019-12-12 ENCOUNTER — Other Ambulatory Visit: Payer: Self-pay

## 2019-12-12 VITALS — BP 120/90 | HR 68 | Ht 73.0 in | Wt 255.2 lb

## 2019-12-12 DIAGNOSIS — I10 Essential (primary) hypertension: Secondary | ICD-10-CM | POA: Diagnosis not present

## 2019-12-12 DIAGNOSIS — R42 Dizziness and giddiness: Secondary | ICD-10-CM | POA: Diagnosis not present

## 2019-12-12 MED ORDER — IRBESARTAN 150 MG PO TABS: 150.0000 mg | ORAL_TABLET | Freq: Two times a day (BID) | ORAL | 5 refills | Status: DC

## 2019-12-12 NOTE — Patient Instructions (Signed)
Medication Instructions:   Your physician has recommended you make the following change in your medication:   1.  STOP taking your Valsartan. 2.  START taking Irbesartan 150 MG:  Take one tab by mouth twice a day.  *If you need a refill on your cardiac medications before your next appointment, please call your pharmacy*   Lab Work: None Ordered If you have labs (blood work) drawn today and your tests are completely normal, you will receive your results only by: Marland Kitchen MyChart Message (if you have MyChart) OR . A paper copy in the mail If you have any lab test that is abnormal or we need to change your treatment, we will call you to review the results.   Testing/Procedures: None Ordered   Follow-Up: At [facility], you and your health needs are our priority.  As part of our continuing mission to provide you with exceptional heart care, we have created designated Provider Care Teams.  These Care Teams include your primary Cardiologist (physician) and Advanced Practice Providers (APPs -  Physician Assistants and Nurse Practitioners) who all work together to provide you with the care you need, when you need it.  We recommend signing up for the patient portal called "MyChart".  Sign up information is provided on this After Visit Summary.  MyChart is used to connect with patients for Virtual Visits (Telemedicine).  Patients are able to view lab/test results, encounter notes, upcoming appointments, etc.  Non-urgent messages can be sent to your provider as well.   To learn more about what you can do with MyChart, go to [email or web address].    Your next appointment:   3 week(s)  The format for your next appointment:   In Person  Provider:   Kate Sable, MD   Other Instructions

## 2019-12-12 NOTE — Progress Notes (Signed)
Cardiology Office Note:    Date:  12/12/2019   ID:  Seth Meadows, DOB 1960-11-12, MRN [id number]  PCP:  Steele Sizer, MD  Cardiologist:  Kate Sable, MD  Electrophysiologist:  None   Referring MD: Steele Sizer, MD   Chief Complaint  Patient presents with  . other    12 month follow up. Meds reviewed by the pt. verbally. "doing well."     History of Present Illness:    Seth Meadows is a 59 y.o. male with a hx of hypertension, hypogonadism, OSA on CPAP who presents for follow-up.  Recently seen for elevated blood pressures and dizziness.  Medications were adjusted, amlodipine decreased to 2.5 mg daily, and eventually stopped.  Valsartan increased to 160 mg twice daily.  His symptoms of dizziness and blood pressures improved on current regimen.  He states doing well, states having frequent loose stools since starting valsartan.   Past Medical History:  Diagnosis Date  . Allergic rhinitis   . BPH with obstruction/lower urinary tract symptoms   . Cutaneous eruption   . Earache   . Epididymitis, left   . Hyperglycemia   . Hypertension   . Hypogonadism in male   . Kidney function test abnormal   . Overweight   . Scrotal pain   . Sleep apnea     Past Surgical History:  Procedure Laterality Date  . EYELID LACERATION REPAIR  01/2017   cut the the eye lid to drain the infection out  . WISDOM TOOTH EXTRACTION      Current Medications: Current Meds  Medication Sig  . cyclobenzaprine (FLEXERIL) 10 MG tablet Take 1 tablet (10 mg total) by mouth 3 (three) times daily as needed for muscle spasms. Take mainly at bedtime as may make drowsy, not to drive after taking  . naproxen (NAPROSYN) 500 MG tablet Take 1 tablet (500 mg total) by mouth 2 (two) times daily with a meal.  . SYRINGE-NEEDLE, DISP, 3 ML (MONOJECT SAFETY SYRINGE/SHIELD) 23G X 1" 3 ML MISC Inject 47mL intramuscular every 10days  . tadalafil (CIALIS) 20 MG tablet Take 20 mg by mouth daily as needed.   .  testosterone cypionate (DEPOTESTOSTERONE CYPIONATE) 200 MG/ML injection Inject 100 mg into the muscle once a week.   . [DISCONTINUED] valsartan (DIOVAN) 160 MG tablet Take 1 tablet (160 mg total) by mouth 2 (two) times daily.     Allergies:   Patient has no known allergies.   Social History   Socioeconomic History  . Marital status: Married    Spouse name: July   . Number of children: 3  . Years of education: Not on file  . Highest education level: Bachelor's degree (e.g., BA, AB, BS)  Occupational History  . Occupation: Art gallery manager   Tobacco Use  . Smoking status: Never Smoker  . Smokeless tobacco: Never Used  Vaping Use  . Vaping Use: Never used  Substance and Sexual Activity  . Alcohol use: No    Comment: occasional  . Drug use: No  . Sexual activity: Yes    Partners: Female  Other Topics Concern  . Not on file  Social History Narrative   Married, has three children,    Twin daughters still in middle school   Son is going to school at [employer]    He also has a step son    Social Determinants of Radio broadcast assistant Strain:   . Difficulty of Paying Living Expenses: Not on file  Food Insecurity:   .  Worried About Charity fundraiser in the Last Year: Not on file  . Ran Out of Food in the Last Year: Not on file  Transportation Needs:   . Lack of Transportation (Medical): Not on file  . Lack of Transportation (Non-Medical): Not on file  Physical Activity:   . Days of Exercise per Week: Not on file  . Minutes of Exercise per Session: Not on file  Stress:   . Feeling of Stress : Not on file  Social Connections:   . Frequency of Communication with Friends and Family: Not on file  . Frequency of Social Gatherings with Friends and Family: Not on file  . Attends Religious Services: Not on file  . Active Member of Clubs or Organizations: Not on file  . Attends Archivist Meetings: Not on file  . Marital Status: Not on file     Family  History: The patient's family history includes Diabetes in his brother and mother; Hypertension in his brother, father, and mother; Prostate cancer in his father; Skin cancer in his sister; Thyroid disease in his father. There is no history of Kidney disease or Bladder Cancer.  ROS:   Please see the history of present illness.     All other systems reviewed and are negative.  EKGs/Labs/Other Studies Reviewed:    The following studies were reviewed today:   EKG:  EKG is  ordered today.  The ekg ordered today demonstrates sinus rhythm, normal EKG  Recent Labs: 06/03/2019: BUN 18; Creatinine, Ser 1.21; Potassium 4.5; Sodium 139 09/24/2019: ALT 22 11/10/2019: Hemoglobin 18.4; Platelets 193  Recent Lipid Panel    Component Value Date/Time   CHOL 158 11/19/2018 0803   TRIG 89 11/19/2018 0803   HDL 35 (L) 11/19/2018 0803   CHOLHDL 4.5 11/19/2018 0803   CHOLHDL 3.1 03/08/2017 1054   VLDL 12 07/10/2016 0835   LDLCALC 106 (H) 11/19/2018 0803   LDLCALC 61 03/08/2017 1054    Physical Exam:    VS:  BP 120/90 (BP Location: Left Arm, Patient Position: Sitting, Cuff Size: Large)   Pulse 68   Ht 6\' 1"  (1.854 m)   Wt 255 lb 4 oz (115.8 kg)   SpO2 98%   BMI 33.68 kg/m     Wt Readings from Last 3 Encounters:  12/12/19 255 lb 4 oz (115.8 kg)  10/23/19 265 lb 12.8 oz (120.6 kg)  10/06/19 263 lb 4.8 oz (119.4 kg)     GEN:  Well nourished, well developed in no acute distress HEENT: Normal NECK: No JVD; No carotid bruits LYMPHATICS: No lymphadenopathy CARDIAC: RRR, no murmurs, rubs, gallops RESPIRATORY:  Clear to auscultation without rales, wheezing or rhonchi  ABDOMEN: Soft, non-tender, non-distended MUSCULOSKELETAL:  No edema; No deformity  SKIN: Warm and dry NEUROLOGIC:  Alert and oriented x 3 PSYCHIATRIC:  Normal affect   ASSESSMENT:    1. Essential hypertension   2. Dizziness    PLAN:    In order of problems listed above:  1. Hypertension, blood pressure controlled,  patient having frequent loose stools on valsartan.  Stop valsartan, start irbesartan 150 mg twice daily.  If symptoms of loose stools improve, can consider consolidating irbesartan to 300 mg daily. 2. Previous dizziness with blood pressure medications.  Symptoms are resolved since stopping amlodipine.   Follow-up in 1 month.  This note was generated in part or whole with voice recognition software. Voice recognition is usually quitovercorrectede accurate but there are transcription errors that can and very often  do occur. I apologize for any typographical errors that were not detected and corrected.  Medication Adjustments/Labs and Tests Ordered: Current medicines are reviewed at length with the patient today.  Concerns regarding medicines are outlined above.  Orders Placed This Encounter  Procedures  . EKG 12-Lead   Meds ordered this encounter  Medications  . irbesartan (AVAPRO) 150 MG tablet    Sig: Take 1 tablet (150 mg total) by mouth in the morning and at bedtime.    Dispense:  60 tablet    Refill:  5    Patient Instructions  Medication Instructions:   Your physician has recommended you make the following change in your medication:   1.  STOP taking your Valsartan. 2.  START taking Irbesartan 150 MG:  Take one tab by mouth twice a day.  *If you need a refill on your cardiac medications before your next appointment, please call your pharmacy*   Lab Work: None Ordered If you have labs (blood work) drawn today and your tests are completely normal, you will receive your results only by: Marland Kitchen MyChart Message (if you have MyChart) OR . A paper copy in the mail If you have any lab test that is abnormal or we need to change your treatment, we will call you to review the results.   Testing/Procedures: None Ordered   Follow-Up: At [facility], you and your health needs are our priority.  As part of our continuing mission to provide you with exceptional heart care, we have  created designated Provider Care Teams.  These Care Teams include your primary Cardiologist (physician) and Advanced Practice Providers (APPs -  Physician Assistants and Nurse Practitioners) who all work together to provide you with the care you need, when you need it.  We recommend signing up for the patient portal called "MyChart".  Sign up information is provided on this After Visit Summary.  MyChart is used to connect with patients for Virtual Visits (Telemedicine).  Patients are able to view lab/test results, encounter notes, upcoming appointments, etc.  Non-urgent messages can be sent to your provider as well.   To learn more about what you can do with MyChart, go to [email or web address].    Your next appointment:   3 week(s)  The format for your next appointment:   In Person  Provider:   Kate Sable, MD   Other Instructions      Signed, Kate Sable, MD  12/12/2019 4:30 PM    [place]

## 2020-01-06 ENCOUNTER — Other Ambulatory Visit: Payer: Self-pay

## 2020-01-06 ENCOUNTER — Encounter: Payer: Self-pay | Admitting: Cardiology

## 2020-01-06 ENCOUNTER — Ambulatory Visit: Payer: BC Managed Care – PPO | Admitting: Cardiology

## 2020-01-06 VITALS — BP 152/92 | HR 74 | Ht 73.0 in | Wt 257.0 lb

## 2020-01-06 DIAGNOSIS — T887XXA Unspecified adverse effect of drug or medicament, initial encounter: Secondary | ICD-10-CM

## 2020-01-06 DIAGNOSIS — I10 Essential (primary) hypertension: Secondary | ICD-10-CM

## 2020-01-06 MED ORDER — IRBESARTAN 150 MG PO TABS: 300.0000 mg | ORAL_TABLET | Freq: Every day | ORAL | 5 refills | Status: DC

## 2020-01-06 NOTE — Progress Notes (Signed)
Cardiology Office Note:    Date:  01/06/2020   ID:  Seth Meadows, DOB [birth year], MRN [id number]  PCP:  Steele Sizer, MD  Cardiologist:  Kate Sable, MD  Electrophysiologist:  None   Referring MD: Steele Sizer, MD   Chief Complaint  Patient presents with  . Other    3 week follow up. Patient has no complaints. Meds reviewed verbally with patient.     History of Present Illness:    Seth Meadows is a 59 y.o. male with a hx of hypertension, hypogonadism, OSA on CPAP who presents for follow-up.  Previously seen for elevated blood pressure and dizziness.  Started on valsartan but this caused loose stools.  Valsartan was switched to irbesartan 150 mg twice daily.  He symptoms are improved, his blood pressure appears controlled at home with systolics in the 709G to 283M.  Prior notes Amlodipine dose of 2.5 mg daily because dizziness.  Dizziness improved after stopping amlodipine. Valsartan caused loose stools.   Past Medical History:  Diagnosis Date  . Allergic rhinitis   . BPH with obstruction/lower urinary tract symptoms   . Cutaneous eruption   . Earache   . Epididymitis, left   . Hyperglycemia   . Hypertension   . Hypogonadism in male   . Kidney function test abnormal   . Overweight   . Scrotal pain   . Sleep apnea     Past Surgical History:  Procedure Laterality Date  . EYELID LACERATION REPAIR  01/2017   cut the the eye lid to drain the infection out  . WISDOM TOOTH EXTRACTION      Current Medications: Current Meds  Medication Sig  . cyclobenzaprine (FLEXERIL) 10 MG tablet Take 1 tablet (10 mg total) by mouth 3 (three) times daily as needed for muscle spasms. Take mainly at bedtime as may make drowsy, not to drive after taking  . naproxen (NAPROSYN) 500 MG tablet Take 1 tablet (500 mg total) by mouth 2 (two) times daily with a meal.  . SYRINGE-NEEDLE, DISP, 3 ML (MONOJECT SAFETY SYRINGE/SHIELD) 23G X 1" 3 ML MISC Inject 32mL intramuscular every 10days   . tadalafil (CIALIS) 20 MG tablet Take 20 mg by mouth daily as needed.   . testosterone cypionate (DEPOTESTOSTERONE CYPIONATE) 200 MG/ML injection Inject 100 mg into the muscle once a week.   . [DISCONTINUED] irbesartan (AVAPRO) 150 MG tablet Take 1 tablet (150 mg total) by mouth in the morning and at bedtime.     Allergies:   Patient has no known allergies.   Social History   Socioeconomic History  . Marital status: Married    Spouse name: July   . Number of children: 3  . Years of education: Not on file  . Highest education level: Bachelor's degree (e.g., BA, AB, BS)  Occupational History  . Occupation: Art gallery manager   Tobacco Use  . Smoking status: Never Smoker  . Smokeless tobacco: Never Used  Vaping Use  . Vaping Use: Never used  Substance and Sexual Activity  . Alcohol use: No    Comment: occasional  . Drug use: No  . Sexual activity: Yes    Partners: Female  Other Topics Concern  . Not on file  Social History Narrative   Married, has three children,    Twin daughters still in middle school   Son is going to school at [employer]    He also has a step son    Social Determinants of Radio broadcast assistant Strain:  Not on file  Food Insecurity: Not on file  Transportation Needs: Not on file  Physical Activity: Not on file  Stress: Not on file  Social Connections: Not on file     Family History: The patient's family history includes Diabetes in his brother and mother; Hypertension in his brother, father, and mother; Prostate cancer in his father; Skin cancer in his sister; Thyroid disease in his father. There is no history of Kidney disease or Bladder Cancer.  ROS:   Please see the history of present illness.     All other systems reviewed and are negative.  EKGs/Labs/Other Studies Reviewed:    The following studies were reviewed today:   EKG:  EKG not ordered today.   Recent Labs: 06/03/2019: BUN 18; Creatinine, Ser 1.21; Potassium 4.5; Sodium  139 09/24/2019: ALT 22 11/10/2019: Hemoglobin 18.4; Platelets 193  Recent Lipid Panel    Component Value Date/Time   CHOL 158 11/19/2018 0803   TRIG 89 11/19/2018 0803   HDL 35 (L) 11/19/2018 0803   CHOLHDL 4.5 11/19/2018 0803   CHOLHDL 3.1 03/08/2017 1054   VLDL 12 07/10/2016 0835   LDLCALC 106 (H) 11/19/2018 0803   LDLCALC 61 03/08/2017 1054    Physical Exam:    VS:  BP (!) 152/92 (BP Location: Left Arm, Patient Position: Sitting, Cuff Size: Normal)   Pulse 74   Ht 6\' 1"  (1.854 m)   Wt 257 lb (116.6 kg)   SpO2 97%   BMI 33.91 kg/m     Wt Readings from Last 3 Encounters:  01/06/20 257 lb (116.6 kg)  12/12/19 255 lb 4 oz (115.8 kg)  10/23/19 265 lb 12.8 oz (120.6 kg)     GEN:  Well nourished, well developed in no acute distress HEENT: Normal NECK: No JVD; No carotid bruits LYMPHATICS: No lymphadenopathy CARDIAC: RRR, no murmurs, rubs, gallops RESPIRATORY:  Clear to auscultation without rales, wheezing or rhonchi  ABDOMEN: Soft, non-tender, non-distended MUSCULOSKELETAL:  No edema; No deformity  SKIN: Warm and dry NEUROLOGIC:  Alert and oriented x 3 PSYCHIATRIC:  Normal affect   ASSESSMENT:    1. Essential hypertension   2. Medication side effect    PLAN:    In order of problems listed above:  1. Hypertension, BP controlled at home, elevated today from rushing to clinic.  Systolic usually in the 323F to 130s.  Consolidate irbesartan to 300 mg daily.   2. Patient educated on side effects of testosterone including causing hypertension which is likely contributing to patient's elevated BPs.  Follow-up in 1 month.  This note was generated in part or whole with voice recognition software. Voice recognition is usually quitovercorrectede accurate but there are transcription errors that can and very often do occur. I apologize for any typographical errors that were not detected and corrected.  Medication Adjustments/Labs and Tests Ordered: Current medicines are  reviewed at length with the patient today.  Concerns regarding medicines are outlined above.  No orders of the defined types were placed in this encounter.  Meds ordered this encounter  Medications  . irbesartan (AVAPRO) 150 MG tablet    Sig: Take 2 tablets (300 mg total) by mouth daily.    Dispense:  60 tablet    Refill:  5    Patient Instructions  Medication Instructions:  Your physician recommends that you continue on your current medications as directed. Please refer to the Current Medication list given to you today.  Take your AVAPRO as 300 MG (2 tabs) once  a day.  *If you need a refill on your cardiac medications before your next appointment, please call your pharmacy*   Lab Work: None Ordered If you have labs (blood work) drawn today and your tests are completely normal, you will receive your results only by: Marland Kitchen MyChart Message (if you have MyChart) OR . A paper copy in the mail If you have any lab test that is abnormal or we need to change your treatment, we will call you to review the results.   Testing/Procedures: None Ordered   Follow-Up: At [facility], you and your health needs are our priority.  As part of our continuing mission to provide you with exceptional heart care, we have created designated Provider Care Teams.  These Care Teams include your primary Cardiologist (physician) and Advanced Practice Providers (APPs -  Physician Assistants and Nurse Practitioners) who all work together to provide you with the care you need, when you need it.  We recommend signing up for the patient portal called "MyChart".  Sign up information is provided on this After Visit Summary.  MyChart is used to connect with patients for Virtual Visits (Telemedicine).  Patients are able to view lab/test results, encounter notes, upcoming appointments, etc.  Non-urgent messages can be sent to your provider as well.   To learn more about what you can do with MyChart, go to  [email or web address].    Your next appointment:   1 year(s)  The format for your next appointment:   In Person  Provider:   Kate Sable, MD   Other Instructions      Signed, Kate Sable, MD  01/06/2020 10:40 AM    [place]

## 2020-01-06 NOTE — Patient Instructions (Signed)
Medication Instructions:  Your physician recommends that you continue on your current medications as directed. Please refer to the Current Medication list given to you today.  Take your AVAPRO as 300 MG (2 tabs) once a day.  *If you need a refill on your cardiac medications before your next appointment, please call your pharmacy*   Lab Work: None Ordered If you have labs (blood work) drawn today and your tests are completely normal, you will receive your results only by: Marland Kitchen MyChart Message (if you have MyChart) OR . A paper copy in the mail If you have any lab test that is abnormal or we need to change your treatment, we will call you to review the results.   Testing/Procedures: None Ordered   Follow-Up: At [facility], you and your health needs are our priority.  As part of our continuing mission to provide you with exceptional heart care, we have created designated Provider Care Teams.  These Care Teams include your primary Cardiologist (physician) and Advanced Practice Providers (APPs -  Physician Assistants and Nurse Practitioners) who all work together to provide you with the care you need, when you need it.  We recommend signing up for the patient portal called "MyChart".  Sign up information is provided on this After Visit Summary.  MyChart is used to connect with patients for Virtual Visits (Telemedicine).  Patients are able to view lab/test results, encounter notes, upcoming appointments, etc.  Non-urgent messages can be sent to your provider as well.   To learn more about what you can do with MyChart, go to [email or web address].    Your next appointment:   1 year(s)  The format for your next appointment:   In Person  Provider:   Kate Sable, MD   Other Instructions

## 2020-02-03 NOTE — Progress Notes (Signed)
Name: Seth Meadows   MRN: [id number]    DOB: 02/02/1960   Date:02/04/2020       Progress Note  Subjective  Chief Complaint  Follow Up  HPI  OSA: he  Is compliant with CPAP machine , he feels tired during the day, explained Provigil can help with energy , he is willing to try it   Dyslipidemia:low HDL, he eats enough fish weekly but will increase tree nuts, we will recheck labs   Hypogonadism: under the care of Urologist, last PSA was done 12/2019 and normal, HCT is slightly higher than prior to initiation of therapy, he is using injectable testosterone and also takes sildenafil. He does not want to stop supplementation because it increases his energy level   Hyperglycemia: last A1C was 5.5% He denies polyphagia, polydipsia or polyuria   HTN: he is now taking Ibersatan 150 mg BID, he has seen cardiologist and norvasc was stopped due to dizziness, valsartan caused diarrhea and is now taking ibersatan bid and still has loose stools 3-4 times a day and he does not like it  , bp was elevated when he first came in, denies chest pain or palpitation.   Onychomycosis: we gave him Lamisil back in may 2021, he states almost completely resolved.   Intermittent low back pain: pain got worse September 2021, he saw Dr. Roxan Hockey and was given a round of prednisone and flexeril. He is doing well now, intermittent symptoms with sharp pain.  Mass on upper back: going on for years, no-tender , no drainage, he had US soft tissue done 09/21 that showed lipoma, he states he was having some pain but that resolved, he states it does not , he saw Dermatologist and advised to see a surgeon, but he states not bothering him as much now and we will monitor, discussed change to liposarcoma   IMPRESSION: 6.6 x 1.6 x 3.7 cm subcutaneous mass in the right lower lateral chest has echogenicity similar to fat. Probable lipoma. This could be confirmed with CT.  Patient Active Problem List   Diagnosis Date Noted  .  Pinched nerve in shoulder, left 10/23/2019  . Soft tissue mass 10/06/2019  . OSA on CPAP 02/28/2016  . Vitamin D insufficiency 08/31/2015  . Dyslipidemia 03/01/2015  . Erectile dysfunction of organic origin 10/29/2014  . Arthritis of lumbar spine 10/22/2014  . Intermittent low back pain 09/21/2014  . Blood glucose elevated 08/17/2014  . Hypogonadism in male 07/30/2014  . Hypertension     Past Surgical History:  Procedure Laterality Date  . EYELID LACERATION REPAIR  01/2017   cut the the eye lid to drain the infection out  . WISDOM TOOTH EXTRACTION      Family History  Problem Relation Age of Onset  . Hypertension Mother   . Diabetes Mother   . Hypertension Father   . Prostate cancer Father   . Thyroid disease Father   . Skin cancer Sister   . Diabetes Brother   . Hypertension Brother   . Kidney disease Neg Hx   . Bladder Cancer Neg Hx     Social History   Tobacco Use  . Smoking status: Never Smoker  . Smokeless tobacco: Never Used  Substance Use Topics  . Alcohol use: No    Comment: occasional     Current Outpatient Medications:  .  cyclobenzaprine (FLEXERIL) 10 MG tablet, Take 1 tablet (10 mg total) by mouth 3 (three) times daily as needed for muscle spasms. Take mainly at bedtime  as may make drowsy, not to drive after taking, Disp: 30 tablet, Rfl: 1 .  lisinopril (ZESTRIL) 20 MG tablet, Take 1 tablet (20 mg total) by mouth in the morning and at bedtime., Disp: 60 tablet, Rfl: 0 .  modafinil (PROVIGIL) 100 MG tablet, Take 1 tablet (100 mg total) by mouth daily., Disp: 90 tablet, Rfl: 0 .  SYRINGE-NEEDLE, DISP, 3 ML (MONOJECT SAFETY SYRINGE/SHIELD) 23G X 1" 3 ML MISC, Inject 63mL intramuscular every 10days, Disp: , Rfl:  .  testosterone cypionate (DEPOTESTOSTERONE CYPIONATE) 200 MG/ML injection, Inject 100 mg into the muscle once a week. , Disp: , Rfl: 0 .  tadalafil (CIALIS) 20 MG tablet, Take 20 mg by mouth daily as needed. , Disp: , Rfl:   Allergies  Allergen  Reactions  . Valsartan Other (See Comments)    diarrhea    I personally reviewed active problem list, medication list, allergies, family history with the patient/caregiver today.   ROS  Constitutional: Negative for fever or weight change.  Respiratory: Negative for cough and shortness of breath.   Cardiovascular: Negative for chest pain or palpitations.  Gastrointestinal: Negative for abdominal pain, no bowel changes.  Musculoskeletal: Negative for gait problem or joint swelling.  Skin: Negative for rash.  Neurological: Negative for dizziness or headache.  No other specific complaints in a complete review of systems (except as listed in HPI above).  Objective  Vitals:   02/04/20 0814 02/04/20 0855  BP: (!) 152/92 140/86  Pulse: 62   Temp: 97.9 F (36.6 C)   SpO2: 96%   Weight: 258 lb 14.4 oz (117.4 kg)   Height: 6\' 1"  (1.854 m)     Body mass index is 34.16 kg/m.  Physical Exam  Constitutional: Patient appears well-developed and well-nourished. Obese  No distress.  HEENT: head atraumatic, normocephalic, pupils equal and reactive to light,  neck supple Cardiovascular: Normal rate, regular rhythm and normal heart sounds.  No murmur heard. No BLE edema. Pulmonary/Chest: Effort normal and breath sounds normal. No respiratory distress. Abdominal: Soft.  There is no tenderness. Psychiatric: Patient has a normal mood and affect. behavior is normal. Judgment and thought content normal.  Recent Results (from the past 2160 hour(s))  PSA, SERUM (SERIAL MONITOR)     Status: None   Collection Time: 11/10/19  8:18 AM  Result Value Ref Range   Prostate Specific Ag, Serum 2.2 0.0 - 4.0 ng/mL    Comment: Roche ECLIA methodology. According to the American Urological Association, Serum PSA should decrease and remain at undetectable levels after radical prostatectomy. The AUA defines biochemical recurrence as an initial PSA value 0.2 ng/mL or greater followed by a subsequent  confirmatory PSA value 0.2 ng/mL or greater. Values obtained with different assay methods or kits cannot be used interchangeably. Results cannot be interpreted as absolute evidence of the presence or absence of malignant disease.   Estradiol     Status: Abnormal   Collection Time: 11/10/19  8:18 AM  Result Value Ref Range   Estradiol 72.3 (H) 7.6 - 42.6 pg/mL    Comment: Roche ECLIA methodology  CBC with Differential/Platelet     Status: Abnormal   Collection Time: 11/10/19  8:18 AM  Result Value Ref Range   WBC 6.8 3.4 - 10.8 x10E3/uL   RBC 6.54 (H) 4.14 - 5.80 x10E6/uL   Hemoglobin 18.4 (H) 13.0 - 17.7 g/dL   Hematocrit 55.5 (H) 37.5 - 51.0 %   MCV 85 79 - 97 fL   MCH 28.1 26.6 -  33.0 pg   MCHC 33.2 31.5 - 35.7 g/dL   RDW 15.1 11.6 - 15.4 %   Platelets 193 150 - 450 x10E3/uL   Neutrophils 61 Not Estab. %   Lymphs 25 Not Estab. %   Monocytes 10 Not Estab. %   Eos 3 Not Estab. %   Basos 1 Not Estab. %   Neutrophils Absolute 4.2 1.4 - 7.0 x10E3/uL   Lymphocytes Absolute 1.7 0.7 - 3.1 x10E3/uL   Monocytes Absolute 0.7 0.1 - 0.9 x10E3/uL   EOS (ABSOLUTE) 0.2 0.0 - 0.4 x10E3/uL   Basophils Absolute 0.1 0.0 - 0.2 x10E3/uL   Immature Granulocytes 0 Not Estab. %   Immature Grans (Abs) 0.0 0.0 - 0.1 x10E3/uL  Testosterone     Status: None   Collection Time: 11/10/19  8:18 AM  Result Value Ref Range   Testosterone 848 264 - 916 ng/dL    Comment: Adult male reference interval is based on a population of healthy nonobese males (BMI <30) between 25 and 47 years old. Ruth, [place] [id number]. PMID: [id number].       PHQ2/9: Depression screen [facility] 2/9 02/04/2020 10/23/2019 10/06/2019 05/27/2019 10/16/2018  Decreased Interest 0 0 0 0 0  Down, Depressed, Hopeless 0 0 0 0 0  PHQ - 2 Score 0 0 0 0 0  Altered sleeping 0 - - 0 1  Tired, decreased energy 0 - - 0 0  Change in appetite 0 - - 0 0  Feeling bad or failure about yourself  0 - - 0 0  Trouble concentrating 0 - - 0 0   Moving slowly or fidgety/restless 0 - - 0 0  Suicidal thoughts 0 - - 0 0  PHQ-9 Score 0 - - 0 1  Difficult doing work/chores Not difficult at all - - Not difficult at all Not difficult at all    phq 9 is negative   Fall Risk: Fall Risk  02/04/2020 10/23/2019 10/06/2019 05/27/2019 10/16/2018  Falls in the past year? 0 - 0 0 0  Number falls in past yr: 0 0 0 0 0  Injury with Fall? 0 0 0 0 0  Follow up - Falls evaluation completed Falls evaluation completed - -    Assessment & Plan  1. Dyslipidemia  - Lipid panel  2. Essential hypertension  - COMPLETE METABOLIC PANEL WITH GFR - CBC with Differential/Platelet  3. Lipoma of skin and subcutaneous tissue of trunk   4. Obesity (BMI 30.0-34.9)  Discussed with the patient the risk posed by an increased BMI. Discussed importance of portion control, calorie counting and at least 150 minutes of physical activity weekly. Avoid sweet beverages and drink more water. Eat at least 6 servings of fruit and vegetables daily   5. OSA on CPAP  - modafinil (PROVIGIL) 100 MG tablet; Take 1 tablet (100 mg total) by mouth daily.  Dispense: 90 tablet; Refill: 0  6. Diabetes mellitus screening  - Hemoglobin A1c  7. Hypogonadism in male  Consider stopping testosterone   8. Need for shingles vaccine  - Varicella-zoster vaccine IM

## 2020-02-04 ENCOUNTER — Encounter: Payer: Self-pay | Admitting: Family Medicine

## 2020-02-04 ENCOUNTER — Ambulatory Visit: Payer: 59 | Admitting: Family Medicine

## 2020-02-04 ENCOUNTER — Other Ambulatory Visit: Payer: Self-pay

## 2020-02-04 VITALS — BP 140/86 | HR 62 | Temp 97.9°F | Ht 73.0 in | Wt 258.9 lb

## 2020-02-04 DIAGNOSIS — D171 Benign lipomatous neoplasm of skin and subcutaneous tissue of trunk: Secondary | ICD-10-CM

## 2020-02-04 DIAGNOSIS — E785 Hyperlipidemia, unspecified: Secondary | ICD-10-CM | POA: Diagnosis not present

## 2020-02-04 DIAGNOSIS — Z23 Encounter for immunization: Secondary | ICD-10-CM | POA: Diagnosis not present

## 2020-02-04 DIAGNOSIS — I10 Essential (primary) hypertension: Secondary | ICD-10-CM | POA: Diagnosis not present

## 2020-02-04 DIAGNOSIS — G4733 Obstructive sleep apnea (adult) (pediatric): Secondary | ICD-10-CM

## 2020-02-04 DIAGNOSIS — E669 Obesity, unspecified: Secondary | ICD-10-CM

## 2020-02-04 DIAGNOSIS — E291 Testicular hypofunction: Secondary | ICD-10-CM

## 2020-02-04 DIAGNOSIS — Z131 Encounter for screening for diabetes mellitus: Secondary | ICD-10-CM

## 2020-02-04 DIAGNOSIS — Z9989 Dependence on other enabling machines and devices: Secondary | ICD-10-CM

## 2020-02-04 MED ORDER — LISINOPRIL 20 MG PO TABS: 20.0000 mg | ORAL_TABLET | Freq: Two times a day (BID) | ORAL | 0 refills | Status: DC

## 2020-02-04 MED ORDER — MODAFINIL 100 MG PO TABS: 100.0000 mg | ORAL_TABLET | Freq: Every day | ORAL | 0 refills | Status: DC

## 2020-02-05 LAB — HEMOGLOBIN A1C
Hgb A1c MFr Bld: 5.4 % of total Hgb (ref <5.7)
Mean Plasma Glucose: 108 mg/dL
eAG (mmol/L): 6 mmol/L

## 2020-02-05 LAB — COMPLETE METABOLIC PANEL WITH GFR
AG Ratio: 1.6 (calc) (ref 1.0–2.5)
ALT: 21 U/L (ref 9–46)
AST: 28 U/L (ref 10–35)
Albumin: 4.1 g/dL (ref 3.6–5.1)
Alkaline phosphatase (APISO): 54 U/L (ref 35–144)
BUN: 19 mg/dL (ref 7–25)
CO2: 29 mmol/L (ref 20–32)
Calcium: 9 mg/dL (ref 8.6–10.3)
Chloride: 104 mmol/L (ref 98–110)
Creat: 1.24 mg/dL (ref 0.70–1.25)
GFR, Est African American: 73 mL/min/{1.73_m2} (ref >=60)
GFR, Est Non African American: 63 mL/min/{1.73_m2} (ref >=60)
Globulin: 2.5 g/dL (calc) (ref 1.9–3.7)
Glucose, Bld: 92 mg/dL (ref 65–99)
Potassium: 4.4 mmol/L (ref 3.5–5.3)
Sodium: 140 mmol/L (ref 135–146)
Total Bilirubin: 0.9 mg/dL (ref 0.2–1.2)
Total Protein: 6.6 g/dL (ref 6.1–8.1)

## 2020-02-05 LAB — CBC WITH DIFFERENTIAL/PLATELET
Absolute Monocytes: 671 cells/uL (ref 200–950)
Basophils Absolute: 67 cells/uL (ref 0–200)
Basophils Relative: 1.1 %
Eosinophils Absolute: 159 cells/uL (ref 15–500)
Eosinophils Relative: 2.6 %
HCT: 52 % — ABNORMAL HIGH (ref 38.5–50.0)
Hemoglobin: 17.7 g/dL — ABNORMAL HIGH (ref 13.2–17.1)
Lymphs Abs: 1812 cells/uL (ref 850–3900)
MCH: 28.8 pg (ref 27.0–33.0)
MCHC: 34 g/dL (ref 32.0–36.0)
MCV: 84.6 fL (ref 80.0–100.0)
MPV: 11.6 fL (ref 7.5–12.5)
Monocytes Relative: 11 %
Neutro Abs: 3392 cells/uL (ref 1500–7800)
Neutrophils Relative %: 55.6 %
Platelets: 190 10*3/uL (ref 140–400)
RBC: 6.15 10*6/uL — ABNORMAL HIGH (ref 4.20–5.80)
RDW: 13.5 % (ref 11.0–15.0)
Total Lymphocyte: 29.7 %
WBC: 6.1 10*3/uL (ref 3.8–10.8)

## 2020-02-05 LAB — LIPID PANEL
Cholesterol: 172 mg/dL (ref <200)
HDL: 35 mg/dL — ABNORMAL LOW (ref >=40)
LDL Cholesterol (Calc): 120 mg/dL (calc) — ABNORMAL HIGH
Non-HDL Cholesterol (Calc): 137 mg/dL (calc) — ABNORMAL HIGH (ref <130)
Total CHOL/HDL Ratio: 4.9 (calc) (ref <5.0)
Triglycerides: 71 mg/dL (ref <150)

## 2020-02-11 ENCOUNTER — Ambulatory Visit: Payer: 59

## 2020-03-03 ENCOUNTER — Other Ambulatory Visit: Payer: Self-pay | Admitting: Family Medicine

## 2020-05-04 ENCOUNTER — Encounter: Payer: Self-pay | Admitting: Family Medicine

## 2020-05-04 ENCOUNTER — Ambulatory Visit (INDEPENDENT_AMBULATORY_CARE_PROVIDER_SITE_OTHER): Payer: 59 | Admitting: Family Medicine

## 2020-05-04 ENCOUNTER — Other Ambulatory Visit: Payer: Self-pay

## 2020-05-04 VITALS — BP 140/86 | HR 85 | Temp 97.9°F | Resp 16 | Ht 73.0 in | Wt 251.0 lb

## 2020-05-04 DIAGNOSIS — Z9989 Dependence on other enabling machines and devices: Secondary | ICD-10-CM

## 2020-05-04 DIAGNOSIS — G4733 Obstructive sleep apnea (adult) (pediatric): Secondary | ICD-10-CM | POA: Diagnosis not present

## 2020-05-04 DIAGNOSIS — I10 Essential (primary) hypertension: Secondary | ICD-10-CM | POA: Diagnosis not present

## 2020-05-04 DIAGNOSIS — Z0000 Encounter for general adult medical examination without abnormal findings: Secondary | ICD-10-CM | POA: Diagnosis not present

## 2020-05-04 DIAGNOSIS — E785 Hyperlipidemia, unspecified: Secondary | ICD-10-CM | POA: Diagnosis not present

## 2020-05-04 DIAGNOSIS — K644 Residual hemorrhoidal skin tags: Secondary | ICD-10-CM

## 2020-05-04 DIAGNOSIS — S76312A Strain of muscle, fascia and tendon of the posterior muscle group at thigh level, left thigh, initial encounter: Secondary | ICD-10-CM

## 2020-05-04 DIAGNOSIS — Z23 Encounter for immunization: Secondary | ICD-10-CM | POA: Diagnosis not present

## 2020-05-04 MED ORDER — AMLODIPINE BESY-BENAZEPRIL HCL 5-20 MG PO CAPS: 1.0000 | ORAL_CAPSULE | Freq: Every day | ORAL | 0 refills | Status: DC

## 2020-05-04 MED ORDER — ROSUVASTATIN CALCIUM 5 MG PO TABS: 5.0000 mg | ORAL_TABLET | Freq: Every day | ORAL | 1 refills | Status: DC

## 2020-05-04 MED ORDER — HYDROCORTISONE ACETATE 25 MG RE SUPP
25.0000 mg | Freq: Two times a day (BID) | RECTAL | 0 refills | Status: DC
Start: 2020-05-04 — End: 2020-05-05

## 2020-05-04 NOTE — Progress Notes (Signed)
Name: Seth Meadows   MRN: [id number]    DOB: 12/27/1960   Date:05/04/2020       Progress Note  Subjective  Chief Complaint  Annual Exam  HPI  Patient presents for annual CPE and follow up. Patient is aware may have additional charges   OSA: he  Is compliant with CPAP machine , he feels tired during the day, we gave him provigil but he did not noticed a significant improvement, advised to try going up to 200 mg and let me know. He is not ready to go back for a sleep study but would like a new machine   Dyslipidemia: he used to take Atorvastatin in the past , he states he is wiling to resume medication , but prefers another medication since he had some upset stomach with medication   The 10-year ASCVD risk score Mikey Bussing DC Brooke Bonito., et al., 2013) is: 14.2%   Values used to calculate the score:     Age: 60 years     Sex: Male     Is Non-Hispanic African American: No     Diabetic: No     Tobacco smoker: No     Systolic Blood Pressure: 564 mmHg     Is BP treated: Yes     HDL Cholesterol: 35 mg/dL     Total Cholesterol: 172 mg/dL    HTN: he is now taking Ibersatan 150 mg BID, he has seen cardiologist and norvasc was stopped due to dizziness, valsartan caused diarrhea and is now taking ibersatan bid and still has loose stools 3-4 times a day and he does not like it. He is now on lisinopril 60 mg daily, we will try switching from that to lotrel 5/20 and return in 1-2 weeks to recheck bp He states diarrhea resolved, but now he has normal stools, he noticed a bulging sensation on anal area and thinks it is hemorrhoids. Discussed sending him sooner for colonoscopy due to change in bowel movements    IPSS Questionnaire (AUA-7): Over the past month.   1)  How often have you had a sensation of not emptying your bladder completely after you finish urinating?  0 - Not at all  2)  How often have you had to urinate again less than two hours after you finished urinating? 0 - Not at all  3)  How often have  you found you stopped and started again several times when you urinated?  0 - Not at all  4) How difficult have you found it to postpone urination?  0 - Not at all  5) How often have you had a weak urinary stream?  0 - Not at all  6) How often have you had to push or strain to begin urination?  0 - Not at all  7) How many times did you most typically get up to urinate from the time you went to bed until the time you got up in the morning?  1 - 1 time  Total score:  0-7 mildly symptomatic   8-19 moderately symptomatic   20-35 severely symptomatic     Diet: balanced  Exercise: continue regular physical activity   Depression: phq 9 is negative Depression screen [facility] 2/9 05/04/2020 02/04/2020 10/23/2019 10/06/2019 05/27/2019  Decreased Interest 0 0 0 0 0  Down, Depressed, Hopeless 0 0 0 0 0  PHQ - 2 Score 0 0 0 0 0  Altered sleeping - 0 - - 0  Tired, decreased energy - 0 - -  0  Change in appetite - 0 - - 0  Feeling bad or failure about yourself  - 0 - - 0  Trouble concentrating - 0 - - 0  Moving slowly or fidgety/restless - 0 - - 0  Suicidal thoughts - 0 - - 0  PHQ-9 Score - 0 - - 0  Difficult doing work/chores - Not difficult at all - - Not difficult at all  Some recent data might be hidden    Hypertension:  BP Readings from Last 3 Encounters:  05/04/20 140/86  02/04/20 140/86  01/06/20 (!) 152/92    Obesity: Wt Readings from Last 3 Encounters:  05/04/20 251 lb (113.9 kg)  02/04/20 258 lb 14.4 oz (117.4 kg)  01/06/20 257 lb (116.6 kg)   BMI Readings from Last 3 Encounters:  05/04/20 33.12 kg/m  02/04/20 34.16 kg/m  01/06/20 33.91 kg/m     Lipids:  Lab Results  Component Value Date   CHOL 172 02/04/2020   CHOL 158 11/19/2018   CHOL 109 03/08/2017   Lab Results  Component Value Date   HDL 35 (L) 02/04/2020   HDL 35 (L) 11/19/2018   HDL 35 (L) 03/08/2017   Lab Results  Component Value Date   LDLCALC 120 (H) 02/04/2020   LDLCALC 106 (H) 11/19/2018   LDLCALC 61  03/08/2017   Lab Results  Component Value Date   TRIG 71 02/04/2020   TRIG 89 11/19/2018   TRIG 58 03/08/2017   Lab Results  Component Value Date   CHOLHDL 4.9 02/04/2020   CHOLHDL 4.5 11/19/2018   CHOLHDL 3.1 03/08/2017   No results found for: LDLDIRECT Glucose:  Glucose  Date Value Ref Range Status  06/03/2019 97 65 - 99 mg/dL Final  11/19/2018 95 65 - 99 mg/dL Final   Glucose, Bld  Date Value Ref Range Status  02/04/2020 92 65 - 99 mg/dL Final    Comment:    .            Fasting reference interval .   12/07/2016 93 65 - 99 mg/dL Final    Comment:    .            Fasting reference interval .   03/24/2016 89 65 - 99 mg/dL Final    Flowsheet Row [facility] Visit from 05/04/2020 in [facility]  AUDIT-C Score 1       Married STD testing and prevention (HIV/chl/gon/syphilis): not interested  Hep C: 07/12/16  Skin cancer: Discussed monitoring for atypical lesions Colorectal cancer: 02/25/12 Prostate cancer: he sees Urologist    Lung cancer:  Low Dose CT Chest recommended if Age 74-80 years, 30 pack-year currently smoking OR have quit w/in 15years. Patient does not qualify.   AAA: The USPSTF recommends one-time screening with ultrasonography in men ages 66 to 35 years who have ever smoked ECG:  12/12/19  Vaccines:   Shingrix: 22-64 yo and ask insurance if covered when patient above 11 yo. Today  Flu: educated and discussed with patient.  Advanced Care Planning: A voluntary discussion about advance care planning including the explanation and discussion of advance directives.  Discussed health care proxy and Living will, and the patient was able to identify a health care proxy as wife    Patient Active Problem List   Diagnosis Date Noted  . Pinched nerve in shoulder, left 10/23/2019  . Soft tissue mass 10/06/2019  . OSA on CPAP 02/28/2016  . Vitamin D insufficiency 08/31/2015  . Dyslipidemia 03/01/2015  .  Erectile dysfunction of organic  origin 10/29/2014  . Arthritis of lumbar spine 10/22/2014  . Intermittent low back pain 09/21/2014  . Blood glucose elevated 08/17/2014  . Hypogonadism in male 07/30/2014  . Hypertension     Past Surgical History:  Procedure Laterality Date  . EYELID LACERATION REPAIR  01/2017   cut the the eye lid to drain the infection out  . WISDOM TOOTH EXTRACTION      Family History  Problem Relation Age of Onset  . Hypertension Mother   . Diabetes Mother   . Hypertension Father   . Prostate cancer Father   . Thyroid disease Father   . Skin cancer Sister   . Diabetes Brother   . Hypertension Brother   . Kidney disease Neg Hx   . Bladder Cancer Neg Hx     Social History   Socioeconomic History  . Marital status: Married    Spouse name: July   . Number of children: 3  . Years of education: Not on file  . Highest education level: Bachelor's degree (e.g., BA, AB, BS)  Occupational History  . Occupation: Art gallery manager   Tobacco Use  . Smoking status: Never Smoker  . Smokeless tobacco: Never Used  Vaping Use  . Vaping Use: Never used  Substance and Sexual Activity  . Alcohol use: No    Comment: occasional  . Drug use: No  . Sexual activity: Yes    Partners: Female  Other Topics Concern  . Not on file  Social History Narrative   Married, has three children,    Twin daughters still in middle school   Son is going to school at [employer]    He also has a step son    Social Determinants of Radio broadcast assistant Strain: Low Risk   . Difficulty of Paying Living Expenses: Not hard at all  Food Insecurity: No Food Insecurity  . Worried About Charity fundraiser in the Last Year: Never true  . Ran Out of Food in the Last Year: Never true  Transportation Needs: No Transportation Needs  . Lack of Transportation (Medical): No  . Lack of Transportation (Non-Medical): No  Physical Activity: Sufficiently Active  . Days of Exercise per Week: 5 days  . Minutes of Exercise per  Session: 40 min  Stress: No Stress Concern Present  . Feeling of Stress : Only a little  Social Connections: Socially Integrated  . Frequency of Communication with Friends and Family: More than three times a week  . Frequency of Social Gatherings with Friends and Family: More than three times a week  . Attends Religious Services: More than 4 times per year  . Active Member of Clubs or Organizations: Yes  . Attends Archivist Meetings: More than 4 times per year  . Marital Status: Married  Human resources officer Violence: Not At Risk  . Fear of Current or Ex-Partner: No  . Emotionally Abused: No  . Physically Abused: No  . Sexually Abused: No     Current Outpatient Medications:  .  amLODipine-benazepril (LOTREL) 5-20 MG capsule, Take 1 capsule by mouth daily., Disp: 30 capsule, Rfl: 0 .  cyclobenzaprine (FLEXERIL) 10 MG tablet, Take 1 tablet (10 mg total) by mouth 3 (three) times daily as needed for muscle spasms. Take mainly at bedtime as may make drowsy, not to drive after taking, Disp: 30 tablet, Rfl: 1 .  modafinil (PROVIGIL) 100 MG tablet, Take 1 tablet (100 mg total) by mouth  daily., Disp: 90 tablet, Rfl: 0 .  rosuvastatin (CRESTOR) 5 MG tablet, Take 1 tablet (5 mg total) by mouth daily., Disp: 90 tablet, Rfl: 1 .  SYRINGE-NEEDLE, DISP, 3 ML (MONOJECT SAFETY SYRINGE/SHIELD) 23G X 1" 3 ML MISC, Inject 56mL intramuscular every 10days, Disp: , Rfl:  .  testosterone cypionate (DEPOTESTOSTERONE CYPIONATE) 200 MG/ML injection, Inject 100 mg into the muscle once a week. , Disp: , Rfl: 0  Allergies  Allergen Reactions  . Valsartan Other (See Comments)    diarrhea     ROS  Constitutional: Negative for fever or weight change.  Respiratory: Negative for cough and shortness of breath.   Cardiovascular: Negative for chest pain or palpitations.  Gastrointestinal: Negative for abdominal pain, no bowel changes.  Musculoskeletal: Negative for gait problem or joint swelling.  Skin:  Negative for rash.  Neurological: Negative for dizziness or headache.  No other specific complaints in a complete review of systems (except as listed in HPI above).   Objective  Vitals:   05/04/20 0918  BP: 140/86  Pulse: 85  Resp: 16  Temp: 97.9 F (36.6 C)  TempSrc: Oral  SpO2: 97%  Weight: 251 lb (113.9 kg)  Height: 6\' 1"  (1.854 m)    Body mass index is 33.12 kg/m.  Physical Exam  Constitutional: Patient appears well-developed and well-nourished. No distress.  HENT: Head: Normocephalic and atraumatic. Ears: B TMs ok, no erythema or effusion; Nose: Nose normal. Mouth/Throat: Oropharynx is clear and moist. No oropharyngeal exudate.  Eyes: Conjunctivae and EOM are normal. Pupils are equal, round, and reactive to light. No scleral icterus.  Neck: Normal range of motion. Neck supple. No JVD present. No thyromegaly present.  Cardiovascular: Normal rate, regular rhythm and normal heart sounds.  No murmur heard. No BLE edema. Pulmonary/Chest: Effort normal and breath sounds normal. No respiratory distress. Abdominal: Soft. Bowel sounds are normal, no distension. There is no tenderness. no masses MALE GENITALIA: Normal descended testes bilaterally, no masses palpated, no hernias, no lesions, no discharge RECTAL large non thrombosed external hemorrhoids, digital exam not done  Musculoskeletal: Normal range of motion, no joint effusions. No gross deformities Neurological: he is alert and oriented to person, place, and time. No cranial nerve deficit. Coordination, balance, strength, speech and gait are normal.  Skin: Skin is warm and dry. No rash noted. No erythema.  Psychiatric: Patient has a normal mood and affect. behavior is normal. Judgment and thought content normal.   Fall Risk: Fall Risk  05/04/2020 02/04/2020 10/23/2019 10/06/2019 05/27/2019  Falls in the past year? 0 0 - 0 0  Number falls in past yr: 0 0 0 0 0  Injury with Fall? 0 0 0 0 0  Follow up - - Falls evaluation  completed Falls evaluation completed -     Functional Status Survey: Is the patient deaf or have difficulty hearing?: No Does the patient have difficulty seeing, even when wearing glasses/contacts?: No Does the patient have difficulty concentrating, remembering, or making decisions?: No Does the patient have difficulty walking or climbing stairs?: No Does the patient have difficulty dressing or bathing?: No Does the patient have difficulty doing errands alone such as visiting a doctor's office or shopping?: No    Assessment & Plan  1. Well adult exam   2. Essential hypertension  - amLODipine-benazepril (LOTREL) 5-20 MG capsule; Take 1 capsule by mouth daily.  Dispense: 30 capsule; Refill: 0  3. Need for shingles vaccine  - Varicella-zoster vaccine IM (Shingrix)  4. OSA on  CPAP  CPAP machine, previous study at sleep med  5. Dyslipidemia  - rosuvastatin (CRESTOR) 5 MG tablet; Take 1 tablet (5 mg total) by mouth daily.  Dispense: 90 tablet; Refill: 1  6. External hemorrhoid  - hydrocortisone (ANUSOL-HC) 25 MG suppository; Place 1 suppository (25 mg total) rectally 2 (two) times daily.  Dispense: 12 suppository; Refill: 0  7. Strain of left hamstring muscle, initial encounter  Feeling better,  Happened a couple of weeks ago, we will give him home stretching exercises  -Prostate cancer screening and PSA options (with potential risks and benefits of testing vs not testing) were discussed along with recent recs/guidelines. -USPSTF grade A and B recommendations reviewed with patient; age-appropriate recommendations, preventive care, screening tests, etc discussed and encouraged; healthy living encouraged; see AVS for patient education given to patient -Discussed importance of 150 minutes of physical activity weekly, eat two servings of fish weekly, eat one serving of tree nuts ( cashews, pistachios, pecans, almonds.Marland Kitchen) every other day, eat 6 servings of fruit/vegetables daily and  drink plenty of water and avoid sweet beverages.

## 2020-05-04 NOTE — Patient Instructions (Addendum)
Preventive Care 40-61 Years Old, Male Preventive care refers to lifestyle choices and visits with your health care provider that can promote health and wellness. This includes:  A yearly physical exam. This is also called an annual wellness visit.  Regular dental and eye exams.  Immunizations.  Screening for certain conditions.  Healthy lifestyle choices, such as: ? Eating a healthy diet. ? Getting regular exercise. ? Not using drugs or products that contain nicotine and tobacco. ? Limiting alcohol use. What can I expect for my preventive care visit? Physical exam Your health care provider will check your:  Height and weight. These may be used to calculate your BMI (body mass index). BMI is a measurement that tells if you are at a healthy weight.  Heart rate and blood pressure.  Body temperature.  Skin for abnormal spots. Counseling Your health care provider may ask you questions about your:  Past medical problems.  Family's medical history.  Alcohol, tobacco, and drug use.  Emotional well-being.  Home life and relationship well-being.  Sexual activity.  Diet, exercise, and sleep habits.  Work and work environment.  Access to firearms. What immunizations do I need? Vaccines are usually given at various ages, according to a schedule. Your health care provider will recommend vaccines for you based on your age, medical history, and lifestyle or other factors, such as travel or where you work.   What tests do I need? Blood tests  Lipid and cholesterol levels. These may be checked every 5 years, or more often if you are over 50 years old.  Hepatitis C test.  Hepatitis B test. Screening  Lung cancer screening. You may have this screening every year starting at age 61 if you have a 30-pack-year history of smoking and currently smoke or have quit within the past 15 years.  Prostate cancer screening. Recommendations will vary depending on your family history and  other risks.  Genital exam to check for testicular cancer or hernias.  Colorectal cancer screening. ? All adults should have this screening starting at age 61 and continuing until age 75. ? Your health care provider may recommend screening at age 61 if you are at increased risk. ? You will have tests every 1-10 years, depending on your results and the type of screening test.  Diabetes screening. ? This is done by checking your blood sugar (glucose) after you have not eaten for a while (fasting). ? You may have this done every 1-3 years.  STD (sexually transmitted disease) testing, if you are at risk. Follow these instructions at home: Eating and drinking  Eat a diet that includes fresh fruits and vegetables, whole grains, lean protein, and low-fat dairy products.  Take vitamin and mineral supplements as recommended by your health care provider.  Do not drink alcohol if your health care provider tells you not to drink.  If you drink alcohol: ? Limit how much you have to 0-2 drinks a day. ? Be aware of how much alcohol is in your drink. In the [place] one drink equals one 12 oz bottle of beer (355 mL), one 5 oz glass of wine (148 mL), or one 1 oz glass of hard liquor (44 mL).   Lifestyle  Take daily care of your teeth and gums. Brush your teeth every morning and night with fluoride toothpaste. Floss one time each day.  Stay active. Exercise for at least 30 minutes 5 or more days each week.  Do not use any products that contain nicotine or   tobacco, such as cigarettes, e-cigarettes, and chewing tobacco. If you need help quitting, ask your health care provider.  Do not use drugs.  If you are sexually active, practice safe sex. Use a condom or other form of protection to prevent STIs (sexually transmitted infections).  If told by your health care provider, take low-dose aspirin daily starting at age 61.  Find healthy ways to cope with stress, such as: ? Meditation, yoga, or  listening to music. ? Journaling. ? Talking to a trusted person. ? Spending time with friends and family. Safety  Always wear your seat belt while driving or riding in a vehicle.  Do not drive: ? If you have been drinking alcohol. Do not ride with someone who has been drinking. ? When you are tired or distracted. ? While texting.  Wear a helmet and other protective equipment during sports activities.  If you have firearms in your house, make sure you follow all gun safety procedures. What's next?  Go to your health care provider once a year for an annual wellness visit.  Ask your health care provider how often you should have your eyes and teeth checked.  Stay up to date on all vaccines. This information is not intended to replace advice given to you by your health care provider. Make sure you discuss any questions you have with your health care provider. Document Revised: 10/08/2018 Document Reviewed: 01/03/2018 Elsevier Patient Education  2021 [place].  Hamstring Strain Rehab Ask your health care provider which exercises are safe for you. Do exercises exactly as told by your health care provider and adjust them as directed. It is normal to feel mild stretching, pulling, tightness, or discomfort as you do these exercises. Stop right away if you feel sudden pain or your pain gets worse. Do not begin these exercises until told by your health care provider. Stretching and range-of-motion exercises These exercises warm up your muscles and joints and improve the movement and flexibility of your thighs. These exercises also help to relieve pain, numbness, and tingling. Talk to your health care provider about these restrictions. Knee extension, seated 1. Sit with your left / right heel propped on a chair, a coffee table, or a footstool. Do not have anything under your knee to support it. 2. Allow your leg muscles to relax, letting gravity straighten out your knee (extension). You  should feel a stretch behind your left / right knee. 3. If told by your health care provider, deepen the stretch by placing a __________ weight on your thigh, just above your kneecap. 4. Hold this position for __________ seconds. Repeat __________ times. Complete this exercise __________ times a day.   Seated stretch This exercise is sometimes called hamstrings and adductors stretch. 1. Sit on the floor with your legs stretched wide. Keep your knees straight during this exercise. 2. Keeping your head and back in a straight line, bend at your waist to reach for your left foot (position A). You should feel a stretch in your right inner thigh (adductors). 3. Hold this position for __________ seconds. Then slowly return to the upright position. 4. Keeping your head and back in a straight line, bend at your waist to reach forward (position B). You should feel a stretch behind both of your thighs or knees (hamstrings). 5. Hold this position for __________ seconds. Then slowly return to the upright position. 6. Keeping your head and back in a straight line, bend at your waist to reach for your right foot (  position C). You should feel a stretch in your left inner thigh (adductors). 7. Hold this position for __________ seconds. Then slowly return to the upright position. Repeat __________ times. Complete this exercise __________ times a day.   Hamstrings stretch, supine 1. Lie on your back (supine position). 2. Loop a belt or towel over the ball of your left / right foot. The ball of your foot is on the walking surface, right under your toes. 3. Straighten your left / right knee and slowly pull on the belt or towel to raise your leg. ? Do not let your left / right knee bend while you do this. ? Keep your other leg flat on the floor. ? Raise the left / right leg until you feel a gentle stretch behind your left / right knee or thigh (hamstrings). 4. Hold this position for __________ seconds. 5. Slowly  return your leg to the starting position. Repeat __________ times. Complete this exercise __________ times a day.   Strengthening exercises These exercises build strength and endurance in your thighs. Endurance is the ability to use your muscles for a long time, even after they get tired. Straight leg raises, prone This exercise strengthens the muscles that move the hips (hip extensors). 1. Lie on your abdomen on a firm surface (prone position). 2. Tense the muscles in your buttocks and lift your left / right leg about 4 inches (10 cm). Keep your knee straight as you lift your leg. If you cannot lift your leg that high without arching your back, place a pillow under your hips. 3. Hold the position for __________ seconds. 4. Slowly lower your leg to the starting position. 5. Allow your muscles to relax completely before you start the next repetition. Repeat __________ times. Complete this exercise __________ times a day.   Bridge This exercise strengthens the muscles in your buttocks and the back of your thighs (hip extensors). 1. Lie on your back on a firm surface with your knees bent and your feet flat on the floor. 2. Tighten your buttocks muscles and lift your bottom off the floor until the trunk of your body is level with your thighs. ? You should feel the muscles working in your buttocks and the back of your thighs. ? Do not arch your back. 3. Hold this position for __________ seconds. 4. Slowly lower your hips to the starting position. 5. Let your buttocks muscles relax completely between repetitions. 6. If told by your health care provider, keep your bottom lifted off the floor while you slowly walk your feet away from you as far as you can control. Hold for __________ seconds, then slowly walk your feet back toward you. Repeat __________ times. Complete this exercise __________ times a day.   Lateral walking with band This is an exercise in which you walk sideways (lateral), with  tension provided by an exercise band. The exercise strengthens the muscles in your hip (hip abductors). 1. Stand in a long hallway. 2. Wrap a loop of exercise band around your legs, just above your knees. 3. Bend your knees gently and drop your hips down and back so your weight is over your heels. 4. Step to the side to move down the length of the hallway, keeping your toes pointed ahead of you and keeping tension in the band. 5. Repeat, leading with your other leg. Repeat __________ times. Complete this exercise __________ times a day. Single leg stand with reaching This exercise is also called eccentric hamstring stretch.  1. Stand on your left / right foot. Keep your big toe down on the floor and try to keep your arch lifted. 2. Slowly reach down toward the floor as far as you can while keeping your balance. Lowering your thigh under tension is called eccentric stretching. 3. Hold this position for __________ seconds. Repeat __________ times. Complete this exercise __________ times a day. Plank, prone This exercise strengthens muscles in your abdomen and core area. 1. Lie on your abdomen on the floor (prone position),and prop yourself up on your elbows. Your hands should be straight out in front of you, and your elbows should be below your shoulders. Position your feet similar to a push-up position so your toes are on the ground. 2. Tighten your abdominal muscles and lift your body off the floor. ? Do not arch your back. ? Do not hold your breath. 3. Hold this position for __________ seconds. Repeat __________ times. Complete this exercise __________ times a day. This information is not intended to replace advice given to you by your health care provider. Make sure you discuss any questions you have with your health care provider. Document Revised: 05/02/2018 Document Reviewed: 01/07/2018 Elsevier Patient Education  2021 [employer].

## 2020-05-05 ENCOUNTER — Other Ambulatory Visit: Payer: Self-pay | Admitting: Family Medicine

## 2020-05-05 ENCOUNTER — Telehealth: Payer: Self-pay | Admitting: Family Medicine

## 2020-05-05 ENCOUNTER — Ambulatory Visit: Payer: Self-pay | Admitting: *Deleted

## 2020-05-05 ENCOUNTER — Other Ambulatory Visit: Payer: Self-pay

## 2020-05-05 DIAGNOSIS — K644 Residual hemorrhoidal skin tags: Secondary | ICD-10-CM

## 2020-05-05 MED ORDER — HYDROCORTISONE ACETATE 25 MG RE SUPP: 25.0000 mg | Freq: Two times a day (BID) | RECTAL | 0 refills | Status: DC

## 2020-05-05 NOTE — Telephone Encounter (Signed)
Copied from [place] [id number]. Topic: Quick Communication - Rx Refill/Question >> May 05, 2020  2:59 PM Mcneil, Jacinto Reap wrote: Pt requests that the Rx be transferred due to cost  Medication: hydrocortisone (ANUSOL-HC) 25 MG suppository  Has the patient contacted their pharmacy? yes   Preferred Pharmacy (with phone number or street name): [facility] [address] - [place], [address] AT [address]  Phone: [phone]   Fax: [phone]  Agent: Please be advised that RX refills may take up to 3 business days. We ask that you follow-up with your pharmacy.

## 2020-05-05 NOTE — Telephone Encounter (Signed)
Opened by mistake.

## 2020-05-18 ENCOUNTER — Ambulatory Visit: Payer: 59

## 2020-05-18 VITALS — BP 170/94

## 2020-05-18 DIAGNOSIS — Z0130 Encounter for examination of blood pressure without abnormal findings: Secondary | ICD-10-CM

## 2020-05-28 ENCOUNTER — Telehealth: Payer: Self-pay

## 2020-05-28 NOTE — Telephone Encounter (Signed)
Left message to obtain information on diagnostic sleep study. Patient reported he had it done at [place] at [facility], however they stated he refused test on 08/12/2019. Just trying to get some clarification so that we can reorder his cpap supplies.

## 2020-05-29 ENCOUNTER — Other Ambulatory Visit: Payer: Self-pay | Admitting: Family Medicine

## 2020-05-29 DIAGNOSIS — I10 Essential (primary) hypertension: Secondary | ICD-10-CM

## 2020-05-29 NOTE — Telephone Encounter (Signed)
Requested Prescriptions  Pending Prescriptions Disp Refills  . amLODipine-benazepril (LOTREL) 5-20 MG capsule [Pharmacy Med Name: amLODIPine Besy-Benazepril HCl 5-20 MG Oral Capsule] 90 capsule 0    Sig: Take 1 capsule by mouth once daily     Cardiovascular: CCB + ACEI Combos Failed - 05/29/2020 10:08 AM      Failed - Last BP in normal range    BP Readings from Last 1 Encounters:  05/18/20 (!) 170/94         Passed - Cr in normal range and within 180 days    Creat  Date Value Ref Range Status  02/04/2020 1.24 0.70 - 1.25 mg/dL Final    Comment:    For patients >78 years of age, the reference limit for Creatinine is approximately 13% higher for people identified as African-American. .          Passed - K in normal range and within 180 days    Potassium  Date Value Ref Range Status  02/04/2020 4.4 3.5 - 5.3 mmol/L Final         Passed - Patient is not pregnant      Passed - Valid encounter within last 6 months    Recent Outpatient Visits          3 weeks ago Well adult exam   [facility] Steele Sizer, MD   3 months ago Dyslipidemia   [facility] Steele Sizer, MD   7 months ago Acute left-sided thoracic back pain   [facility] Tow[place] Malkin, MD   7 months ago Soft tissue mass   [place], [place]   1 year ago Essential hypertension   [facility] Steele Sizer, MD      Future Appointments            In 2 months Ancil Boozer, Drue Stager, MD [facility], [facility]

## 2020-06-13 ENCOUNTER — Other Ambulatory Visit: Payer: Self-pay

## 2020-06-13 ENCOUNTER — Emergency Department: Payer: 59

## 2020-06-13 DIAGNOSIS — Y9301 Activity, walking, marching and hiking: Secondary | ICD-10-CM | POA: Diagnosis not present

## 2020-06-13 DIAGNOSIS — Z79899 Other long term (current) drug therapy: Secondary | ICD-10-CM | POA: Diagnosis not present

## 2020-06-13 DIAGNOSIS — Y92007 Garden or yard of unspecified non-institutional (private) residence as the place of occurrence of the external cause: Secondary | ICD-10-CM | POA: Insufficient documentation

## 2020-06-13 DIAGNOSIS — Y990 Civilian activity done for income or pay: Secondary | ICD-10-CM | POA: Insufficient documentation

## 2020-06-13 DIAGNOSIS — M7989 Other specified soft tissue disorders: Secondary | ICD-10-CM | POA: Diagnosis not present

## 2020-06-13 DIAGNOSIS — S7012XA Contusion of left thigh, initial encounter: Secondary | ICD-10-CM | POA: Diagnosis not present

## 2020-06-13 DIAGNOSIS — I10 Essential (primary) hypertension: Secondary | ICD-10-CM | POA: Diagnosis not present

## 2020-06-13 DIAGNOSIS — X58XXXA Exposure to other specified factors, initial encounter: Secondary | ICD-10-CM | POA: Insufficient documentation

## 2020-06-13 DIAGNOSIS — S79922A Unspecified injury of left thigh, initial encounter: Secondary | ICD-10-CM | POA: Diagnosis present

## 2020-06-13 NOTE — ED Notes (Signed)
Clarification of triage, no pain in calf with flexion and extension of foot, but pt does have discomfort in posterior thigh with same movement.

## 2020-06-13 NOTE — ED Triage Notes (Signed)
Pt with bruising noted to left posterior knee and thigh without injury. Pt states area burns a little and can feel some discomfort with moving. Pt denies fever, history of DVT denies calf pain, ambulatory without difficulty.

## 2020-06-14 ENCOUNTER — Emergency Department
Admission: EM | Admit: 2020-06-14 | Discharge: 2020-06-14 | Disposition: A | Discharge status: Home / Self Care | Payer: 59 | Attending: Emergency Medicine | Admitting: Emergency Medicine

## 2020-06-14 DIAGNOSIS — S8012XA Contusion of left lower leg, initial encounter: Secondary | ICD-10-CM

## 2020-06-14 NOTE — ED Provider Notes (Signed)
[facility] Emergency Department Provider Note  ____________________________________________  Time seen: Approximately 1:24 AM  I have reviewed the triage vital signs and the nursing notes.   HISTORY  Chief Complaint leg bruising   HPI Seth Meadows is a 60 y.o. male with history of hypogonadism, hypertension, BPH, hyperlipidemia, OSA on CPAP who presents for evaluation of a bruising in the back of his left leg.  Patient reports he was working in the yard yesterday when he felt some discomfort.  He did not notice anything.  Today when he was walking to the store with his wife she noticed a big bruise in the back of his leg.  He denies any known trauma.  He is not sure if he got bitten by an insect yesterday while in the yard.  Does not take any blood thinners.  No prior history of PE or DVT.  He reports that the area is slightly tender to the touch but no severe pain.  No leg swelling, no numbness or weakness of his extremities.  No new medications.   Past Medical History:  Diagnosis Date  . Allergic rhinitis   . BPH with obstruction/lower urinary tract symptoms   . Cutaneous eruption   . Earache   . Epididymitis, left   . Hyperglycemia   . Hypertension   . Hypogonadism in male   . Kidney function test abnormal   . Overweight   . Scrotal pain   . Sleep apnea     Patient Active Problem List   Diagnosis Date Noted  . Pinched nerve in shoulder, left 10/23/2019  . Soft tissue mass 10/06/2019  . OSA on CPAP 02/28/2016  . Vitamin D insufficiency 08/31/2015  . Dyslipidemia 03/01/2015  . Erectile dysfunction of organic origin 10/29/2014  . Arthritis of lumbar spine 10/22/2014  . Intermittent low back pain 09/21/2014  . Blood glucose elevated 08/17/2014  . Hypogonadism in male 07/30/2014  . Hypertension     Past Surgical History:  Procedure Laterality Date  . EYELID LACERATION REPAIR  01/2017   cut the the eye lid to drain the infection out  .  WISDOM TOOTH EXTRACTION      Prior to Admission medications   Medication Sig Start Date End Date Taking? Authorizing Provider  amLODipine-benazepril (LOTREL) 5-20 MG capsule Take 1 capsule by mouth once daily 05/29/20   Steele Sizer, MD  cyclobenzaprine (FLEXERIL) 10 MG tablet Take 1 tablet (10 mg total) by mouth 3 (three) times daily as needed for muscle spasms. Take mainly at bedtime as may make drowsy, not to drive after taking 8/65/78   Towanda Malkin, MD  hydrocortisone (ANUSOL-HC) 25 MG suppository Place 1 suppository (25 mg total) rectally 2 (two) times daily. 05/05/20   Steele Sizer, MD  modafinil (PROVIGIL) 100 MG tablet Take 1 tablet (100 mg total) by mouth daily. 02/04/20   Steele Sizer, MD  rosuvastatin (CRESTOR) 5 MG tablet Take 1 tablet (5 mg total) by mouth daily. 05/04/20   Sowles, Drue Stager, MD  SYRINGE-NEEDLE, DISP, 3 ML (MONOJECT SAFETY SYRINGE/SHIELD) 23G X 1" 3 ML MISC Inject 20mL intramuscular every 10days 06/12/18   [provider]  testosterone cypionate (DEPOTESTOSTERONE CYPIONATE) 200 MG/ML injection Inject 100 mg into the muscle once a week.  03/02/16   [provider]    Allergies Valsartan  Family History  Problem Relation Age of Onset  . Hypertension Mother   . Diabetes Mother   . Hypertension Father   . Prostate cancer Father   .  Thyroid disease Father   . Skin cancer Sister   . Diabetes Brother   . Hypertension Brother   . Kidney disease Neg Hx   . Bladder Cancer Neg Hx     Social History Social History   Tobacco Use  . Smoking status: Never Smoker  . Smokeless tobacco: Never Used  Vaping Use  . Vaping Use: Never used  Substance Use Topics  . Alcohol use: No    Comment: occasional  . Drug use: No    Review of Systems  Constitutional: Negative for fever. Eyes: Negative for visual changes. ENT: Negative for sore throat. Neck: No neck pain  Cardiovascular: Negative for chest pain. Respiratory: Negative for  shortness of breath. Gastrointestinal: Negative for abdominal pain, vomiting or diarrhea. Genitourinary: Negative for dysuria. Musculoskeletal: Negative for back pain. + bruising of the leg Skin: Negative for rash. Neurological: Negative for headaches, weakness or numbness. Psych: No SI or HI  ____________________________________________   PHYSICAL EXAM:  VITAL SIGNS: ED Triage Vitals  Enc Vitals Group     BP 06/13/20 2252 (!) 167/100     Pulse Rate 06/13/20 2252 73     Resp 06/13/20 2252 16     Temp 06/13/20 2252 98.4 F (36.9 C)     Temp Source 06/13/20 2252 Oral     SpO2 06/13/20 2252 100 %     Weight 06/13/20 2253 250 lb (113.4 kg)     Height 06/13/20 2253 6\' 1"  (9.381 m)     Head Circumference --      Peak Flow --      Pain Score 06/13/20 2253 5     Pain Loc --      Pain Edu? --      Excl. in [place]? --     Constitutional: Alert and oriented. Well appearing and in no apparent distress. HEENT:      Head: Normocephalic and atraumatic.         Eyes: Conjunctivae are normal. Sclera is non-icteric.       Mouth/Throat: Mucous membranes are moist.       Neck: Supple with no signs of meningismus. Cardiovascular: Regular rate and rhythm. No murmurs, gallops, or rubs. 2+ symmetrical distal pulses are present in all extremities. No JVD. Respiratory: Normal respiratory effort. Lungs are clear to auscultation bilaterally.  Gastrointestinal: Soft, non tender. Musculoskeletal: There is a large bruise on the posterior aspect of patient's left thigh with mild tenderness, no palpable cords.  Distal extremities warm and well perfused with normal pulses.  No obvious bite marks Neurologic: Normal speech and language. Face is symmetric. Moving all extremities. No gross focal neurologic deficits are appreciated. Skin: Skin is warm, dry and intact. No rash noted. Psychiatric: Mood and affect are normal. Speech and behavior are normal.      ____________________________________________    LABS (all labs ordered are listed, but only abnormal results are displayed)  Labs Reviewed - No data to display ____________________________________________  EKG  none  ____________________________________________  RADIOLOGY  I have personally reviewed the images performed during this visit and I agree with the Radiologist's read.   Interpretation by Radiologist:  US Venous Img Lower Unilateral Left  Result Date: 06/14/2020 CLINICAL DATA:  Thigh pain with flexion and bruising. Positive for color changes/ulcerations, varicose veins, hormone therapy, edema, pain. EXAM: Left LOWER EXTREMITY VENOUS DOPPLER ULTRASOUND TECHNIQUE: Gray-scale sonography with compression, as well as color and duplex ultrasound, were performed to evaluate the deep venous system(s) from the level of the  common femoral vein through the popliteal and proximal calf veins. COMPARISON:  None. FINDINGS: VENOUS Normal compressibility of the common femoral, superficial femoral, and popliteal veins, as well as the visualized calf veins. Visualized portions of profunda femoral vein and great saphenous vein unremarkable. No filling defects to suggest DVT on grayscale or color Doppler imaging. Doppler waveforms show normal direction of venous flow, normal respiratory plasticity and response to augmentation. Limited views of the contralateral common femoral vein are unremarkable. OTHER Bruising medial posterior distal thigh. No associated sonographic abnormality identified other than mild edema. Limitations: none IMPRESSION: No deep venous thrombosis of the left lower extremity. Electronically Signed   By: Iven Finn M.D.   On: 06/14/2020 00:31     ____________________________________________   PROCEDURES  Procedure(s) performed: None Procedures Critical Care performed:  None ____________________________________________   INITIAL IMPRESSION / ASSESSMENT AND PLAN / ED COURSE   60 y.o. male with history of  hypogonadism, hypertension, BPH, hyperlipidemia, OSA on CPAP who presents for evaluation of a bruising in the back of his left leg.  Patient first noticed some discomfort yesterday while working in his yard.  Has pictures from earlier on today which show no changes.  There is no obvious bite marks especially nothing looking like snake bite.  Has no history of thrombocytopenia or any bleeding disorder. Normal labs 10 days ago. Not anticoagulated.  No history of DVT or PE.  Doppler study negative for DVT.  No signs of compartment syndrome.  Most likely bruising from a bite or trauma.  Recommended supportive care follow-up with PCP.  Discussed my standard return precautions.       _____________________________________________ Please note:  Patient was evaluated in Emergency Department today for the symptoms described in the history of present illness. Patient was evaluated in the context of the global COVID-19 pandemic, which necessitated consideration that the patient might be at risk for infection with the SARS-CoV-2 virus that causes COVID-19. Institutional protocols and algorithms that pertain to the evaluation of patients at risk for COVID-19 are in a state of rapid change based on information released by regulatory bodies including the CDC and federal and state organizations. These policies and algorithms were followed during the patient's care in the ED.  Some ED evaluations and interventions may be delayed as a result of limited staffing during the pandemic.   [place] Controlled Substance Database was reviewed by me. ____________________________________________   FINAL CLINICAL IMPRESSION(S) / ED DIAGNOSES   Final diagnoses:  Hematoma of left lower extremity, initial encounter      NEW MEDICATIONS STARTED DURING THIS VISIT:  ED Discharge Orders    None       Note:  This document was prepared using Dragon voice recognition software and may include unintentional dictation errors.     Alfred Levins, [place], MD 06/14/20 [phone]

## 2020-06-14 NOTE — Discharge Instructions (Signed)
Apply ice and elevate.  With your doctor in 3 days as needed.  Return to the emergency room if it is getting worse or spreading.  Take Tylenol for pain.

## 2020-08-07 ENCOUNTER — Other Ambulatory Visit: Payer: Self-pay | Admitting: Family Medicine

## 2020-08-07 DIAGNOSIS — Z9989 Dependence on other enabling machines and devices: Secondary | ICD-10-CM

## 2020-08-07 DIAGNOSIS — G4733 Obstructive sleep apnea (adult) (pediatric): Secondary | ICD-10-CM

## 2020-08-07 NOTE — Telephone Encounter (Signed)
Requested medication (s) are due for refill today: yes  Requested medication (s) are on the active medication list: yes  Last refill:  02/04/20 #90   Future visit scheduled: yes  Notes to clinic:  med not assigned to a protocol   Requested Prescriptions  Pending Prescriptions Disp Refills   modafinil (PROVIGIL) 100 MG tablet [Pharmacy Med Name: Modafinil 100 MG Oral Tablet] 30 tablet 0    Sig: Take 1 tablet by mouth once daily      Off-Protocol Failed - 08/07/2020  1:39 PM      Failed - Medication not assigned to a protocol, review manually.      Passed - Valid encounter within last 12 months    Recent Outpatient Visits           3 months ago Well adult exam   [facility] Steele Sizer, MD   6 months ago Dyslipidemia   [facility] Steele Sizer, MD   9 months ago Acute left-sided thoracic back pain   [facility] Towanda Malkin, MD   10 months ago Soft tissue mass   [place], MD   1 year ago Essential hypertension   [facility] Steele Sizer, MD       Future Appointments             In 4 days Steele Sizer, MD [facility]

## 2020-08-09 NOTE — Telephone Encounter (Signed)
Last seen 4.12.2022 upcoming 7.20.2022

## 2020-08-10 NOTE — Progress Notes (Signed)
Name: Seth Meadows   MRN: [id number]    DOB: 1960-03-06   Date:08/11/2020       Progress Note  Subjective  Chief Complaint  Follow Up  HPI  OSA: he  Is compliant with CPAP machine , and he occasionally gets called in to work in the middle of the night and has been taking modafinil prn 200 mg to stay alert when he has lack of sleep.   Other fatigue: he is now on lower dose of testosterone due to increase in HCT on labs done in March, he states has noticed increase in fatigue at the end of the day, he has also gained weight, less motivation to go to the gym We will recheck other labs   Dyslipidemia: he is back on statin therapy - Crestor 5 mg , no side effects, we will recheck level today   The 10-year ASCVD risk score Mikey Bussing DC Brooke Bonito., et al., 2013) is: 14.2%   Values used to calculate the score:     Age: 60 years     Sex: Male     Is Non-Hispanic African American: No     Diabetic: No     Tobacco smoker: No     Systolic Blood Pressure: 696 mmHg     Is BP treated: Yes     HDL Cholesterol: 35 mg/dL     Total Cholesterol: 172 mg/dL     HTN: he is on Lotrel 5/20 , no longer going up a flights of stairs at work , but still has occasional dizziness when walking a lot at work and sometimes when he stands up quickly, occasionally when sitting in his car. No longer having diarrhea ( seems to be secondary to ARB). His bp also spikes at times it was over 180's . We will try adding hctz in am and take lotrel at night and monitor bp, goal is 130/80's   Patient Active Problem List   Diagnosis Date Noted   Pinched nerve in shoulder, left 10/23/2019   Soft tissue mass 10/06/2019   OSA on CPAP 02/28/2016   Vitamin D insufficiency 08/31/2015   Dyslipidemia 03/01/2015   Erectile dysfunction of organic origin 10/29/2014   Arthritis of lumbar spine 10/22/2014   Intermittent low back pain 09/21/2014   Blood glucose elevated 08/17/2014   Hypogonadism in male 07/30/2014   Hypertension     Past  Surgical History:  Procedure Laterality Date   EYELID LACERATION REPAIR  01/2017   cut the the eye lid to drain the infection out   WISDOM TOOTH EXTRACTION      Family History  Problem Relation Age of Onset   Hypertension Mother    Diabetes Mother    Hypertension Father    Prostate cancer Father    Thyroid disease Father    Skin cancer Sister    Diabetes Brother    Hypertension Brother    Kidney disease Neg Hx    Bladder Cancer Neg Hx     Social History   Tobacco Use   Smoking status: Never   Smokeless tobacco: Never  Substance Use Topics   Alcohol use: No    Comment: occasional     Current Outpatient Medications:    amLODipine-benazepril (LOTREL) 5-20 MG capsule, Take 1 capsule by mouth once daily, Disp: 90 capsule, Rfl: 0   cyclobenzaprine (FLEXERIL) 10 MG tablet, Take 1 tablet (10 mg total) by mouth 3 (three) times daily as needed for muscle spasms. Take mainly at bedtime as may make  drowsy, not to drive after taking, Disp: 30 tablet, Rfl: 1   modafinil (PROVIGIL) 100 MG tablet, Take 1 tablet (100 mg total) by mouth daily., Disp: 90 tablet, Rfl: 0   rosuvastatin (CRESTOR) 5 MG tablet, Take 1 tablet (5 mg total) by mouth daily., Disp: 90 tablet, Rfl: 1   SYRINGE-NEEDLE, DISP, 3 ML (MONOJECT SAFETY SYRINGE/SHIELD) 23G X 1" 3 ML MISC, Inject 110mL intramuscular every 10days, Disp: , Rfl:    testosterone cypionate (DEPOTESTOSTERONE CYPIONATE) 200 MG/ML injection, Inject 100 mg into the muscle once a week. , Disp: , Rfl: 0  Allergies  Allergen Reactions   Valsartan Other (See Comments)    diarrhea    I personally reviewed active problem list, medication list, allergies, family history, social history, health maintenance with the patient/caregiver today.   ROS  Constitutional: Negative for fever or weight change.  Respiratory: Negative for cough and shortness of breath.   Cardiovascular: Negative for chest pain or palpitations.  Gastrointestinal: Negative for  abdominal pain, no bowel changes.  Musculoskeletal: Negative for gait problem or joint swelling.  Skin: Negative for rash.  Neurological: Negative for dizziness or headache.  No other specific complaints in a complete review of systems (except as listed in HPI above).   Objective  Vitals:   08/11/20 0759  BP: 140/90  Pulse: 77  Resp: 16  Temp: 98.1 F (36.7 C)  TempSrc: Rectal  SpO2: 99%  Weight: 259 lb (117.5 kg)  Height: 6\' 1"  (1.854 m)    Body mass index is 34.17 kg/m.  Physical Exam  Constitutional: Patient appears well-developed and well-nourished. Obese  No distress.  HEENT: head atraumatic, normocephalic, pupils equal and reactive to light, neck supple Cardiovascular: Normal rate, regular rhythm and normal heart sounds.  No murmur heard. No BLE edema. Pulmonary/Chest: Effort normal and breath sounds normal. No respiratory distress. Abdominal: Soft.  There is no tenderness. Psychiatric: Patient has a normal mood and affect. behavior is normal. Judgment and thought content normal.   PHQ2/9: Depression screen [facility] 2/9 08/11/2020 05/04/2020 02/04/2020 10/23/2019 10/06/2019  Decreased Interest 0 0 0 0 0  Down, Depressed, Hopeless 0 0 0 0 0  PHQ - 2 Score 0 0 0 0 0  Altered sleeping - - 0 - -  Tired, decreased energy - - 0 - -  Change in appetite - - 0 - -  Feeling bad or failure about yourself  - - 0 - -  Trouble concentrating - - 0 - -  Moving slowly or fidgety/restless - - 0 - -  Suicidal thoughts - - 0 - -  PHQ-9 Score - - 0 - -  Difficult doing work/chores - - Not difficult at all - -  Some recent data might be hidden    phq 9 is negative   Fall Risk: Fall Risk  08/11/2020 05/04/2020 02/04/2020 10/23/2019 10/06/2019  Falls in the past year? 0 0 0 - 0  Number falls in past yr: 0 0 0 0 0  Injury with Fall? 0 0 0 0 0  Follow up - - - Falls evaluation completed Falls evaluation completed      Functional Status Survey: Is the patient deaf or have difficulty  hearing?: No Does the patient have difficulty seeing, even when wearing glasses/contacts?: No Does the patient have difficulty concentrating, remembering, or making decisions?: No Does the patient have difficulty walking or climbing stairs?: No Does the patient have difficulty dressing or bathing?: No Does the patient have difficulty doing errands  alone such as visiting a doctor's office or shopping?: No    Assessment & Plan  1. Essential hypertension  - COMPLETE METABOLIC PANEL WITH GFR - TSH - hydrochlorothiazide (HYDRODIURIL) 12.5 MG tablet; Take 1 tablet (12.5 mg total) by mouth every morning.  Dispense: 90 tablet; Refill: 0 - amLODipine-benazepril (LOTREL) 5-20 MG capsule; Take 1 capsule by mouth every evening.  Dispense: 90 capsule; Refill: 0  2. Dyslipidemia  - Lipid panel  3. Obesity (BMI 30.0-34.9)   4. OSA on CPAP  - modafinil (PROVIGIL) 200 MG tablet; Take 1 tablet (200 mg total) by mouth daily.  Dispense: 90 tablet; Refill: 0  5. Other fatigue  - TSH - Vitamin B12 - VITAMIN D 25 Hydroxy (Vit-D Deficiency, Fractures)  6. Shift work sleep disorder  - modafinil (PROVIGIL) 200 MG tablet; Take 1 tablet (200 mg total) by mouth daily.  Dispense: 90 tablet; Refill: 0  7. Back muscle spasm  - cyclobenzaprine (FLEXERIL) 10 MG tablet; Take 1 tablet (10 mg total) by mouth 3 (three) times daily as needed for muscle spasms. Take mainly at bedtime as may make drowsy, not to drive after taking  Dispense: 30 tablet; Refill: 1

## 2020-08-11 ENCOUNTER — Other Ambulatory Visit: Payer: Self-pay

## 2020-08-11 ENCOUNTER — Ambulatory Visit: Payer: 59 | Admitting: Family Medicine

## 2020-08-11 ENCOUNTER — Encounter: Payer: Self-pay | Admitting: Family Medicine

## 2020-08-11 VITALS — BP 140/90 | HR 77 | Temp 98.1°F | Resp 16 | Ht 73.0 in | Wt 259.0 lb

## 2020-08-11 DIAGNOSIS — Z9989 Dependence on other enabling machines and devices: Secondary | ICD-10-CM

## 2020-08-11 DIAGNOSIS — E785 Hyperlipidemia, unspecified: Secondary | ICD-10-CM

## 2020-08-11 DIAGNOSIS — R5383 Other fatigue: Secondary | ICD-10-CM

## 2020-08-11 DIAGNOSIS — G4733 Obstructive sleep apnea (adult) (pediatric): Secondary | ICD-10-CM | POA: Diagnosis not present

## 2020-08-11 DIAGNOSIS — E669 Obesity, unspecified: Secondary | ICD-10-CM | POA: Diagnosis not present

## 2020-08-11 DIAGNOSIS — M62830 Muscle spasm of back: Secondary | ICD-10-CM

## 2020-08-11 DIAGNOSIS — I10 Essential (primary) hypertension: Secondary | ICD-10-CM

## 2020-08-11 DIAGNOSIS — G4726 Circadian rhythm sleep disorder, shift work type: Secondary | ICD-10-CM

## 2020-08-11 LAB — COMPLETE METABOLIC PANEL WITH GFR
AG Ratio: 1.5 (calc) (ref 1.0–2.5)
ALT: 22 U/L (ref 9–46)
AST: 26 U/L (ref 10–35)
Albumin: 4.3 g/dL (ref 3.6–5.1)
Alkaline phosphatase (APISO): 51 U/L (ref 35–144)
BUN: 19 mg/dL (ref 7–25)
CO2: 29 mmol/L (ref 20–32)
Calcium: 9.1 mg/dL (ref 8.6–10.3)
Chloride: 104 mmol/L (ref 98–110)
Creat: 1.27 mg/dL (ref 0.70–1.35)
Globulin: 2.8 g/dL (calc) (ref 1.9–3.7)
Glucose, Bld: 87 mg/dL (ref 65–99)
Potassium: 4.3 mmol/L (ref 3.5–5.3)
Sodium: 139 mmol/L (ref 135–146)
Total Bilirubin: 0.5 mg/dL (ref 0.2–1.2)
Total Protein: 7.1 g/dL (ref 6.1–8.1)
eGFR: 64 mL/min/{1.73_m2} (ref >=60)

## 2020-08-11 LAB — LIPID PANEL
Cholesterol: 135 mg/dL (ref <200)
HDL: 43 mg/dL (ref >=40)
LDL Cholesterol (Calc): 77 mg/dL (calc)
Non-HDL Cholesterol (Calc): 92 mg/dL (calc) (ref <130)
Total CHOL/HDL Ratio: 3.1 (calc) (ref <5.0)
Triglycerides: 73 mg/dL (ref <150)

## 2020-08-11 LAB — VITAMIN B12: Vitamin B-12: 301 pg/mL (ref 200–1100)

## 2020-08-11 LAB — TSH: TSH: 2.71 mIU/L (ref 0.40–4.50)

## 2020-08-11 LAB — VITAMIN D 25 HYDROXY (VIT D DEFICIENCY, FRACTURES): Vit D, 25-Hydroxy: 25 ng/mL — ABNORMAL LOW (ref 30–100)

## 2020-08-11 MED ORDER — MODAFINIL 200 MG PO TABS: 200.0000 mg | ORAL_TABLET | Freq: Every day | ORAL | 0 refills | Status: DC

## 2020-08-11 MED ORDER — HYDROCHLOROTHIAZIDE 12.5 MG PO TABS: 12.5000 mg | ORAL_TABLET | ORAL | 0 refills | Status: DC

## 2020-08-11 MED ORDER — CYCLOBENZAPRINE HCL 10 MG PO TABS: 10.0000 mg | ORAL_TABLET | Freq: Three times a day (TID) | ORAL | 1 refills | Status: DC | PRN

## 2020-08-11 MED ORDER — AMLODIPINE BESY-BENAZEPRIL HCL 5-20 MG PO CAPS: 1.0000 | ORAL_CAPSULE | Freq: Every evening | ORAL | 0 refills | Status: DC

## 2020-11-10 NOTE — Progress Notes (Signed)
Name: Seth Meadows   MRN: [id number]    DOB: 10-Oct-1960   Date:11/11/2020       Progress Note  Subjective  Chief Complaint  Follow Up  HPI  OSA: he  Is compliant with CPAP machine , and he occasionally gets called in to work in the middle of the night and has been taking modafinil prn 200 mg to stay alert when he has lack of sleep. He takes it prn only and still has medication left at home   Hypogonadism:  seeing Urologist Dr. Lonie Peak, using Trimix injections and taking testosterone   Dyslipidemia: he is back on statin therapy - Crestor 5 mg , no side effects, last LDL was better, down from 120 to 77. It is at goal for him, he thought rosuvastatin was causing diarrhea but has been out of medication for the past 4 days and bowel frequency is the same. He misplaced the bottle and we will send 10 mg so we can try getting LDL below 70   Change in bowel movements: he states over the past of years he noticed his bowel movements have changed once a day to two to three times a day, bristol 4-5 , sometimes has urgency and feels bloated but no abdominal pain, weight has been stable.    HTN: he is on Lotrel 5/20 , no longer going up a flights of stairs at work , but still has occasional dizziness when walking a lot at work and sometimes when he stands up quickly. Last visit he states his bp was spiking to 180's but we switch Lotrel to pm and added hctz in am. He has not been checking bp lately and it has not spiked lately   Dizziness : usually happens when going up the stairs, he can go to the gym and work out for over 30 minutes without problems. He never had a syncopal episode, usually lasts a few seconds and resolves with rest. He has seen cardiologist - he had stress test and echo done in 2015, he states started after he started taking bp medication, usually after he goes up the stairs and gets to the regular flat path when he gets dizzy. It is not associated with chest pain, diaphoresis or SOB  Patient  Active Problem List   Diagnosis Date Noted   Pinched nerve in shoulder, left 10/23/2019   Soft tissue mass 10/06/2019   OSA on CPAP 02/28/2016   Vitamin D insufficiency 08/31/2015   Dyslipidemia 03/01/2015   Erectile dysfunction of organic origin 10/29/2014   Arthritis of lumbar spine 10/22/2014   Intermittent low back pain 09/21/2014   Blood glucose elevated 08/17/2014   Hypogonadism in male 07/30/2014   Hypertension     Past Surgical History:  Procedure Laterality Date   EYELID LACERATION REPAIR  01/2017   cut the the eye lid to drain the infection out   WISDOM TOOTH EXTRACTION      Family History  Problem Relation Age of Onset   Hypertension Mother    Diabetes Mother    Hypertension Father    Prostate cancer Father    Thyroid disease Father    Skin cancer Sister    Diabetes Brother    Hypertension Brother    Kidney disease Neg Hx    Bladder Cancer Neg Hx     Social History   Tobacco Use   Smoking status: Never   Smokeless tobacco: Never  Substance Use Topics   Alcohol use: No    Comment:  occasional     Current Outpatient Medications:    amLODipine-benazepril (LOTREL) 5-20 MG capsule, Take 1 capsule by mouth every evening., Disp: 90 capsule, Rfl: 0   cyclobenzaprine (FLEXERIL) 10 MG tablet, Take 1 tablet (10 mg total) by mouth 3 (three) times daily as needed for muscle spasms. Take mainly at bedtime as may make drowsy, not to drive after taking, Disp: 30 tablet, Rfl: 1   hydrochlorothiazide (HYDRODIURIL) 12.5 MG tablet, Take 1 tablet (12.5 mg total) by mouth every morning., Disp: 90 tablet, Rfl: 0   modafinil (PROVIGIL) 200 MG tablet, Take 1 tablet (200 mg total) by mouth daily., Disp: 90 tablet, Rfl: 0   rosuvastatin (CRESTOR) 5 MG tablet, Take 1 tablet (5 mg total) by mouth daily., Disp: 90 tablet, Rfl: 1   SYRINGE-NEEDLE, DISP, 3 ML (MONOJECT SAFETY SYRINGE/SHIELD) 23G X 1" 3 ML MISC, Inject 21mL intramuscular every 10days, Disp: , Rfl:    testosterone  cypionate (DEPOTESTOSTERONE CYPIONATE) 200 MG/ML injection, Inject 100 mg into the muscle once a week. , Disp: , Rfl: 0  Allergies  Allergen Reactions   Valsartan Other (See Comments)    diarrhea    I personally reviewed active problem list, medication list, allergies, family history, social history, health maintenance with the patient/caregiver today.   ROS  Constitutional: Negative for fever or weight change.  Respiratory: Negative for cough and shortness of breath.   Cardiovascular: Negative for chest pain or palpitations.  Gastrointestinal: Negative for abdominal pain, no bowel changes.  Musculoskeletal: Negative for gait problem or joint swelling.  Skin: Negative for rash.  Neurological: Negative for dizziness or headache.  No other specific complaints in a complete review of systems (except as listed in HPI above).   Objective  Vitals:   11/11/20 0815  BP: 136/82  Pulse: 79  Resp: 16  Temp: (!) 97.5 F (36.4 C)  TempSrc: Oral  SpO2: 96%  Weight: 263 lb (119.3 kg)  Height: 6\' 1"  (1.854 m)    Body mass index is 34.7 kg/m.  Physical Exam  Constitutional: Patient appears well-developed and well-nourished. Obese  No distress.  HEENT: head atraumatic, normocephalic, pupils equal and reactive to light,neck supple Cardiovascular: Normal rate, regular rhythm and normal heart sounds.  No murmur heard. No BLE edema. Pulmonary/Chest: Effort normal and breath sounds normal. No respiratory distress. Abdominal: Soft.  There is no tenderness. Psychiatric: Patient has a normal mood and affect. behavior is normal. Judgment and thought content normal.    PHQ2/9: Depression screen [facility] 2/9 11/11/2020 08/11/2020 05/04/2020 02/04/2020 10/23/2019  Decreased Interest 0 0 0 0 0  Down, Depressed, Hopeless 0 0 0 0 0  PHQ - 2 Score 0 0 0 0 0  Altered sleeping 0 - - 0 -  Tired, decreased energy 0 - - 0 -  Change in appetite 0 - - 0 -  Feeling bad or failure about yourself  0 - - 0 -   Trouble concentrating 0 - - 0 -  Moving slowly or fidgety/restless 0 - - 0 -  Suicidal thoughts 0 - - 0 -  PHQ-9 Score 0 - - 0 -  Difficult doing work/chores Not difficult at all - - Not difficult at all -  Some recent data might be hidden    phq 9 is negative   Fall Risk: Fall Risk  11/11/2020 08/11/2020 05/04/2020 02/04/2020 10/23/2019  Falls in the past year? 0 0 0 0 -  Number falls in past yr: 0 0 0 0 0  Injury with Fall? 0 0 0 0 0  Risk for fall due to : No Fall Risks - - - -  Follow up Falls prevention discussed - - - Falls evaluation completed      Functional Status Survey: Is the patient deaf or have difficulty hearing?: No Does the patient have difficulty seeing, even when wearing glasses/contacts?: No Does the patient have difficulty concentrating, remembering, or making decisions?: No Does the patient have difficulty walking or climbing stairs?: No Does the patient have difficulty dressing or bathing?: No Does the patient have difficulty doing errands alone such as visiting a doctor's office or shopping?: No    Assessment & Plan  1. Dyslipidemia  - rosuvastatin (CRESTOR) 10 MG tablet; Take 1 tablet (10 mg total) by mouth daily.  Dispense: 90 tablet; Refill: 1  2. OSA on CPAP  Continue daily use  3. Need for influenza vaccination  - Flu Vaccine QUAD 6+ mos PF IM (Fluarix Quad PF)  4. Shift work sleep disorder   5. Essential hypertension  - amLODipine-benazepril (LOTREL) 5-20 MG capsule; Take 1 capsule by mouth every evening.  Dispense: 90 capsule; Refill: 1 - hydrochlorothiazide (HYDRODIURIL) 12.5 MG tablet; Take 1 tablet (12.5 mg total) by mouth every morning.  Dispense: 90 tablet; Refill: 1  6. Intermittent low back pain  Stable   7. Change in bowel movement  - Ambulatory referral to Gastroenterology  8. Vitamin D deficiency  - Cholecalciferol (VITAMIN D3) 50 MCG (2000 UT) capsule; Take 1 capsule (2,000 Units total) by mouth daily.  Dispense:  100 capsule; Refill: 1  9. Vitamin B12 deficiency  - Cyanocobalamin (B-12) 1000 MCG SUBL; Place 1 tablet under the tongue 3 (three) times a week.  Dispense: 30 tablet; Refill: 0

## 2020-11-11 ENCOUNTER — Encounter: Payer: Self-pay | Admitting: Family Medicine

## 2020-11-11 ENCOUNTER — Other Ambulatory Visit: Payer: Self-pay

## 2020-11-11 ENCOUNTER — Ambulatory Visit: Payer: 59 | Admitting: Family Medicine

## 2020-11-11 VITALS — BP 136/82 | HR 79 | Temp 97.5°F | Resp 16 | Ht 73.0 in | Wt 263.0 lb

## 2020-11-11 DIAGNOSIS — G4726 Circadian rhythm sleep disorder, shift work type: Secondary | ICD-10-CM | POA: Diagnosis not present

## 2020-11-11 DIAGNOSIS — E785 Hyperlipidemia, unspecified: Secondary | ICD-10-CM | POA: Diagnosis not present

## 2020-11-11 DIAGNOSIS — Z9989 Dependence on other enabling machines and devices: Secondary | ICD-10-CM

## 2020-11-11 DIAGNOSIS — Z23 Encounter for immunization: Secondary | ICD-10-CM | POA: Diagnosis not present

## 2020-11-11 DIAGNOSIS — G4733 Obstructive sleep apnea (adult) (pediatric): Secondary | ICD-10-CM

## 2020-11-11 DIAGNOSIS — R198 Other specified symptoms and signs involving the digestive system and abdomen: Secondary | ICD-10-CM

## 2020-11-11 DIAGNOSIS — E559 Vitamin D deficiency, unspecified: Secondary | ICD-10-CM

## 2020-11-11 DIAGNOSIS — E538 Deficiency of other specified B group vitamins: Secondary | ICD-10-CM

## 2020-11-11 DIAGNOSIS — I10 Essential (primary) hypertension: Secondary | ICD-10-CM

## 2020-11-11 DIAGNOSIS — M545 Low back pain, unspecified: Secondary | ICD-10-CM

## 2020-11-11 MED ORDER — B-12 1000 MCG SL SUBL: 1.0000 | SUBLINGUAL_TABLET | SUBLINGUAL | 0 refills | Status: DC

## 2020-11-11 MED ORDER — AMLODIPINE BESY-BENAZEPRIL HCL 5-20 MG PO CAPS: 1.0000 | ORAL_CAPSULE | Freq: Every evening | ORAL | 1 refills | Status: DC

## 2020-11-11 MED ORDER — ROSUVASTATIN CALCIUM 10 MG PO TABS: 10.0000 mg | ORAL_TABLET | Freq: Every day | ORAL | 1 refills | Status: DC

## 2020-11-11 MED ORDER — HYDROCHLOROTHIAZIDE 12.5 MG PO TABS: 12.5000 mg | ORAL_TABLET | ORAL | 1 refills | Status: DC

## 2020-11-11 MED ORDER — VITAMIN D3 50 MCG (2000 UT) PO CAPS: 2000.0000 [IU] | ORAL_CAPSULE | Freq: Every day | ORAL | 1 refills | Status: DC

## 2020-12-27 ENCOUNTER — Telehealth: Payer: Self-pay

## 2020-12-27 NOTE — Telephone Encounter (Signed)
Copied from Anna [id number] [id number]. Topic: General - Other >> Dec 27, 2020 12:53 PM Seth Meadows wrote: Reason for CRM: The patient has called to request [facility] referral for liposuction / cyst removal   The patient would like to be contacted by [facility] member of clinical staff when possible to further discuss the removal   The patient is uncertain of what type of specialist they would need to see for this particular type of removal   Please contact further when available

## 2020-12-31 NOTE — Telephone Encounter (Signed)
Pt is calling to report that the office in which the referral was placed for a surgeon is not calling him back. And pt is requesting another referral to another office. Advised pt of Sandra's recommendation to call other offices to see what the wait time would be. Pt states that an office will not speak to him without a referral. [phone]

## 2021-01-04 ENCOUNTER — Encounter: Payer: Self-pay | Admitting: Family Medicine

## 2021-03-16 DIAGNOSIS — X32XXXD Exposure to sunlight, subsequent encounter: Secondary | ICD-10-CM | POA: Diagnosis not present

## 2021-03-16 DIAGNOSIS — D225 Melanocytic nevi of trunk: Secondary | ICD-10-CM | POA: Diagnosis not present

## 2021-03-16 DIAGNOSIS — Z1283 Encounter for screening for malignant neoplasm of skin: Secondary | ICD-10-CM | POA: Diagnosis not present

## 2021-03-16 DIAGNOSIS — L570 Actinic keratosis: Secondary | ICD-10-CM | POA: Diagnosis not present

## 2021-03-25 ENCOUNTER — Ambulatory Visit (INDEPENDENT_AMBULATORY_CARE_PROVIDER_SITE_OTHER): Payer: BC Managed Care – PPO | Admitting: Family Medicine

## 2021-03-25 ENCOUNTER — Encounter: Payer: Self-pay | Admitting: Family Medicine

## 2021-03-25 ENCOUNTER — Telehealth: Payer: Self-pay

## 2021-03-25 VITALS — BP 122/78 | HR 78 | Temp 97.8°F | Resp 16 | Ht 73.0 in | Wt 263.9 lb

## 2021-03-25 DIAGNOSIS — M79645 Pain in left finger(s): Secondary | ICD-10-CM | POA: Diagnosis not present

## 2021-03-25 DIAGNOSIS — M79644 Pain in right finger(s): Secondary | ICD-10-CM

## 2021-03-25 MED ORDER — DICLOFENAC SODIUM 1 % EX GEL: 2.0000 g | Freq: Four times a day (QID) | CUTANEOUS | 0 refills | Status: DC

## 2021-03-25 NOTE — Progress Notes (Signed)
Name: Seth Meadows   MRN: [id number]    DOB: Apr 11, 1960   Date:03/25/2021 ? ?     Progress Note ? ?Subjective ? ?Chief Complaint ? ?Finger Joint Pain ? ?HPI ? ?He has noticed pain on right index DIP over the past 2 months, it is always swollen and has a baseline aching pain, but at times the pain is intense to touch and feels more swollen, no previous history of gout.  ?He also injured his left thumb while playing volleyball, the ball moved his thumb backwards, it was very painful initially now it is sore with certain positions , worse with flexion but greatly improved and no swelling  ? ?Patient Active Problem List  ? Diagnosis Date Noted  ? Pinched nerve in shoulder, left 10/23/2019  ? Soft tissue mass 10/06/2019  ? OSA on CPAP 02/28/2016  ? Vitamin D insufficiency 08/31/2015  ? Dyslipidemia 03/01/2015  ? Erectile dysfunction of organic origin 10/29/2014  ? Arthritis of lumbar spine 10/22/2014  ? Intermittent low back pain 09/21/2014  ? Blood glucose elevated 08/17/2014  ? Hypogonadism in male 07/30/2014  ? Hypertension   ? ? ?Past Surgical History:  ?Procedure Laterality Date  ? EYELID LACERATION REPAIR  01/2017  ? cut the the eye lid to drain the infection out  ? WISDOM TOOTH EXTRACTION    ? ? ?Family History  ?Problem Relation Age of Onset  ? Hypertension Mother   ? Diabetes Mother   ? Hypertension Father   ? Prostate cancer Father   ? Thyroid disease Father   ? Skin cancer Sister   ? Diabetes Brother   ? Hypertension Brother   ? Kidney disease Neg Hx   ? Bladder Cancer Neg Hx   ? ? ?Social History  ? ?Tobacco Use  ? Smoking status: Never  ? Smokeless tobacco: Never  ?Substance Use Topics  ? Alcohol use: No  ?  Comment: occasional  ? ? ? ?Current Outpatient Medications:  ?  amLODipine-benazepril (LOTREL) 5-20 MG capsule, Take 1 capsule by mouth every evening., Disp: 90 capsule, Rfl: 1 ?  Cholecalciferol (VITAMIN D3) 50 MCG (2000 UT) capsule, Take 1 capsule (2,000 Units total) by mouth daily., Disp: 100 capsule, Rfl:  1 ?  Cyanocobalamin (B-12) 1000 MCG SUBL, Place 1 tablet under the tongue 3 (three) times a week., Disp: 30 tablet, Rfl: 0 ?  cyclobenzaprine (FLEXERIL) 10 MG tablet, Take 1 tablet (10 mg total) by mouth 3 (three) times daily as needed for muscle spasms. Take mainly at bedtime as may make drowsy, not to drive after taking, Disp: 30 tablet, Rfl: 1 ?  hydrochlorothiazide (HYDRODIURIL) 12.5 MG tablet, Take 1 tablet (12.5 mg total) by mouth every morning., Disp: 90 tablet, Rfl: 1 ?  modafinil (PROVIGIL) 200 MG tablet, Take 1 tablet (200 mg total) by mouth daily., Disp: 90 tablet, Rfl: 0 ?  rosuvastatin (CRESTOR) 10 MG tablet, Take 1 tablet (10 mg total) by mouth daily., Disp: 90 tablet, Rfl: 1 ?  SYRINGE-NEEDLE, DISP, 3 ML (MONOJECT SAFETY SYRINGE/SHIELD) 23G X 1" 3 ML MISC, Inject 61mL intramuscular every 10days, Disp: , Rfl:  ?  testosterone cypionate (DEPOTESTOSTERONE CYPIONATE) 200 MG/ML injection, Inject 100 mg into the muscle once a week. , Disp: , Rfl: 0 ? ?Allergies  ?Allergen Reactions  ? Valsartan Other (See Comments)  ?  diarrhea  ? ? ?I personally reviewed active problem list, medication list, allergies, family history, social history, health maintenance with the patient/caregiver today. ? ? ?ROS ? ?Ten  systems reviewed and is negative except as mentioned in HPI  ? ?Objective ? ?Vitals:  ? 03/25/21 1124  ?BP: 122/78  ?Pulse: 78  ?Resp: 16  ?Temp: 97.8 ?F (36.6 ?C)  ?TempSrc: Oral  ?SpO2: 94%  ?Weight: 263 lb 14.4 oz (119.7 kg)  ?Height: 6\' 1"  (1.854 m)  ? ? ?Body mass index is 34.82 kg/m?. ? ?Physical Exam ? ?Constitutional: Patient appears well-developed and well-nourished. Obese  No distress.  ?HEENT: head atraumatic, normocephalic, pupils equal and reactive to light, neck supple ?Cardiovascular: Normal rate, regular rhythm and normal heart sounds.  No murmur heard. No BLE edema. ?Muscular Skeletal: pain on right index finger with some hypertrophy of distal joint, pain with flexion of left thumb and  palpation of MCP ?Pulmonary/Chest: Effort normal and breath sounds normal. No respiratory distress. ?Abdominal: Soft.  There is no tenderness. ?Psychiatric: Patient has a normal mood and affect. behavior is normal. Judgment and thought content normal.  ? ?PHQ2/9: ?Depression screen [facility] 2/9 03/25/2021 11/11/2020 08/11/2020 05/04/2020 02/04/2020  ?Decreased Interest 0 0 0 0 0  ?Down, Depressed, Hopeless 0 0 0 0 0  ?PHQ - 2 Score 0 0 0 0 0  ?Altered sleeping 0 0 - - 0  ?Tired, decreased energy 0 0 - - 0  ?Change in appetite 0 0 - - 0  ?Feeling bad or failure about yourself  0 0 - - 0  ?Trouble concentrating 0 0 - - 0  ?Moving slowly or fidgety/restless 0 0 - - 0  ?Suicidal thoughts 0 0 - - 0  ?PHQ-9 Score 0 0 - - 0  ?Difficult doing work/chores Not difficult at all Not difficult at all - - Not difficult at all  ?Some recent data might be hidden  ?  ?phq 9 is negative ? ? ?Fall Risk: ?Fall Risk  03/25/2021 11/11/2020 08/11/2020 05/04/2020 02/04/2020  ?Falls in the past year? 0 0 0 0 0  ?Number falls in past yr: 0 0 0 0 0  ?Injury with Fall? 0 0 0 0 0  ?Risk for fall due to : No Fall Risks No Fall Risks - - -  ?Follow up Falls prevention discussed Falls prevention discussed - - -  ? ? ? ? ?Functional Status Survey: ?Is the patient deaf or have difficulty hearing?: No ?Does the patient have difficulty seeing, even when wearing glasses/contacts?: No ?Does the patient have difficulty concentrating, remembering, or making decisions?: No ?Does the patient have difficulty walking or climbing stairs?: No ?Does the patient have difficulty dressing or bathing?: No ?Does the patient have difficulty doing errands alone such as visiting a doctor's office or shopping?: No ? ? ? ?Assessment & Plan ? ?1. Finger pain, right ? ?- Uric acid ?- DG Hand Complete Right; Future ? ?2. Pain of left thumb ?  ?We will try topical medication and if no improvement call back for referral to Ortho ?

## 2021-03-25 NOTE — Telephone Encounter (Signed)
Copied from Louise [id number]. Topic: General - Other >> Mar 25, 2021 12:33 PM Alanda Slim E wrote: Reason for CRM: Pt has a cyst on his right side /Dr Sowles advised him of office to call/ pt stated it is starting to bother him and he has called several place and received no call back or have not been able to schedule / pt would like to know if Dr. Ancil Boozer can help to get the ball rolling / He forgot to mention this while at his appt today /please advise

## 2021-03-28 ENCOUNTER — Other Ambulatory Visit: Payer: Self-pay

## 2021-03-28 ENCOUNTER — Ambulatory Visit
Admission: RE | Admit: 2021-03-28 | Discharge: 2021-03-28 | Disposition: A | Discharge status: Home / Self Care | Payer: BC Managed Care – PPO | Source: Ambulatory Visit | Attending: Family Medicine | Admitting: Family Medicine

## 2021-03-28 ENCOUNTER — Ambulatory Visit
Admission: RE | Admit: 2021-03-28 | Discharge: 2021-03-28 | Disposition: A | Discharge status: Home / Self Care | Payer: BC Managed Care – PPO | Attending: Family Medicine | Admitting: Family Medicine

## 2021-03-28 DIAGNOSIS — M79644 Pain in right finger(s): Secondary | ICD-10-CM | POA: Diagnosis not present

## 2021-03-28 NOTE — Telephone Encounter (Signed)
Called pt no answer left vm to call back  °

## 2021-03-28 NOTE — Telephone Encounter (Signed)
Called back patient confirmed with him since its a new mass and it has not been documented he would need to be seen to be evaluated and referral to be placed. Transferred him to [facility] to get scheduled. ?

## 2021-03-28 NOTE — Telephone Encounter (Signed)
Pt returned Maricela's call / please advise  ?

## 2021-03-29 ENCOUNTER — Ambulatory Visit (INDEPENDENT_AMBULATORY_CARE_PROVIDER_SITE_OTHER): Payer: BC Managed Care – PPO | Admitting: Family Medicine

## 2021-03-29 ENCOUNTER — Other Ambulatory Visit: Payer: BC Managed Care – PPO

## 2021-03-29 ENCOUNTER — Encounter: Payer: Self-pay | Admitting: Family Medicine

## 2021-03-29 VITALS — BP 128/86 | HR 77 | Resp 16 | Ht 73.0 in | Wt 263.0 lb

## 2021-03-29 DIAGNOSIS — R7989 Other specified abnormal findings of blood chemistry: Secondary | ICD-10-CM

## 2021-03-29 DIAGNOSIS — D171 Benign lipomatous neoplasm of skin and subcutaneous tissue of trunk: Secondary | ICD-10-CM | POA: Diagnosis not present

## 2021-03-29 DIAGNOSIS — M79601 Pain in right arm: Secondary | ICD-10-CM

## 2021-03-29 NOTE — Progress Notes (Signed)
Name: Seth Meadows   MRN: [id number]    DOB: 1961-01-06   Date:03/29/2021 ? ?     Progress Note ? ?Subjective ? ?Chief Complaint ? ?Referral ? ?HPI ? ?Mass trunk: going on for years, but now growing and sometimes painful while driving, difficulty sleeping on right lateral decubitus. He states pain is usually dull but sometimes a sting / sharp sensation. No skin changes  ? ?OA hands: showed x-ray we did, using topical medication and seems to be controlling pain ? ?Patient Active Problem List  ? Diagnosis Date Noted  ? Pinched nerve in shoulder, left 10/23/2019  ? Soft tissue mass 10/06/2019  ? OSA on CPAP 02/28/2016  ? Vitamin D insufficiency 08/31/2015  ? Dyslipidemia 03/01/2015  ? Erectile dysfunction of organic origin 10/29/2014  ? Arthritis of lumbar spine 10/22/2014  ? Intermittent low back pain 09/21/2014  ? Blood glucose elevated 08/17/2014  ? Hypogonadism in male 07/30/2014  ? Hypertension   ? ? ?Past Surgical History:  ?Procedure Laterality Date  ? EYELID LACERATION REPAIR  01/2017  ? cut the the eye lid to drain the infection out  ? WISDOM TOOTH EXTRACTION    ? ? ?Family History  ?Problem Relation Age of Onset  ? Hypertension Mother   ? Diabetes Mother   ? Hypertension Father   ? Prostate cancer Father   ? Thyroid disease Father   ? Skin cancer Sister   ? Diabetes Brother   ? Hypertension Brother   ? Kidney disease Neg Hx   ? Bladder Cancer Neg Hx   ? ? ?Social History  ? ?Tobacco Use  ? Smoking status: Never  ? Smokeless tobacco: Never  ?Substance Use Topics  ? Alcohol use: No  ?  Comment: occasional  ? ? ? ?Current Outpatient Medications:  ?  amLODipine-benazepril (LOTREL) 5-20 MG capsule, Take 1 capsule by mouth every evening., Disp: 90 capsule, Rfl: 1 ?  Cholecalciferol (VITAMIN D3) 50 MCG (2000 UT) capsule, Take 1 capsule (2,000 Units total) by mouth daily., Disp: 100 capsule, Rfl: 1 ?  Cyanocobalamin (B-12) 1000 MCG SUBL, Place 1 tablet under the tongue 3 (three) times a week., Disp: 30 tablet, Rfl: 0 ?   cyclobenzaprine (FLEXERIL) 10 MG tablet, Take 1 tablet (10 mg total) by mouth 3 (three) times daily as needed for muscle spasms. Take mainly at bedtime as may make drowsy, not to drive after taking, Disp: 30 tablet, Rfl: 1 ?  diclofenac Sodium (VOLTAREN) 1 % GEL, Apply 2 g topically 4 (four) times daily., Disp: 100 g, Rfl: 0 ?  hydrochlorothiazide (HYDRODIURIL) 12.5 MG tablet, Take 1 tablet (12.5 mg total) by mouth every morning., Disp: 90 tablet, Rfl: 1 ?  modafinil (PROVIGIL) 200 MG tablet, Take 1 tablet (200 mg total) by mouth daily., Disp: 90 tablet, Rfl: 0 ?  rosuvastatin (CRESTOR) 10 MG tablet, Take 1 tablet (10 mg total) by mouth daily., Disp: 90 tablet, Rfl: 1 ?  SYRINGE-NEEDLE, DISP, 3 ML (MONOJECT SAFETY SYRINGE/SHIELD) 23G X 1" 3 ML MISC, Inject 2m intramuscular every 10days, Disp: , Rfl:  ?  testosterone cypionate (DEPOTESTOSTERONE CYPIONATE) 200 MG/ML injection, Inject 100 mg into the muscle once a week. , Disp: , Rfl: 0 ? ?Allergies  ?Allergen Reactions  ? Valsartan Other (See Comments)  ?  diarrhea  ? ? ?I personally reviewed active problem list, medication list, allergies, family history, social history, health maintenance with the patient/caregiver today. ? ? ?ROS ? ?Ten systems reviewed and is negative except as mentioned  in HPI  ? ?Objective ? ?Vitals:  ? 03/29/21 1018  ?BP: 128/86  ?Pulse: 77  ?Resp: 16  ?SpO2: 98%  ?Weight: 263 lb (119.3 kg)  ?Height: '6\' 1"'$  (1.854 m)  ? ? ?Body mass index is 34.7 kg/m?. ? ?Physical Exam ? ?Constitutional: Patient appears well-developed and well-nourished. Obese  No distress.  ?HEENT: head atraumatic, normocephalic, pupils equal and reactive to light, neck supple ?Cardiovascular: Normal rate, regular rhythm and normal heart sounds.  No murmur heard. No BLE edema. ?Pulmonary/Chest: Effort normal and breath sounds normal. No respiratory distress. ?Abdominal: Soft.  There is no tenderness.mass on right side of trunk axillary line , mildly tender to touch, skin  intact, smooth borders ?Psychiatric: Patient has a normal mood and affect. behavior is normal. Judgment and thought content normal.  ? ?PHQ2/9: ?Depression screen [facility] 2/9 03/29/2021 03/25/2021 11/11/2020 08/11/2020 05/04/2020  ?Decreased Interest 0 0 0 0 0  ?Down, Depressed, Hopeless 0 0 0 0 0  ?PHQ - 2 Score 0 0 0 0 0  ?Altered sleeping 0 0 0 - -  ?Tired, decreased energy 0 0 0 - -  ?Change in appetite 0 0 0 - -  ?Feeling bad or failure about yourself  0 0 0 - -  ?Trouble concentrating 0 0 0 - -  ?Moving slowly or fidgety/restless 0 0 0 - -  ?Suicidal thoughts 0 0 0 - -  ?PHQ-9 Score 0 0 0 - -  ?Difficult doing work/chores - Not difficult at all Not difficult at all - -  ?Some recent data might be hidden  ?  ?phq 9 is negative ? ? ?Fall Risk: ?Fall Risk  03/29/2021 03/25/2021 11/11/2020 08/11/2020 05/04/2020  ?Falls in the past year? 0 0 0 0 0  ?Number falls in past yr: 0 0 0 0 0  ?Injury with Fall? 0 0 0 0 0  ?Risk for fall due to : No Fall Risks No Fall Risks No Fall Risks - -  ?Follow up Falls prevention discussed Falls prevention discussed Falls prevention discussed - -  ? ? ? ? ?Functional Status Survey: ?Is the patient deaf or have difficulty hearing?: No ?Does the patient have difficulty seeing, even when wearing glasses/contacts?: No ?Does the patient have difficulty concentrating, remembering, or making decisions?: No ?Does the patient have difficulty walking or climbing stairs?: No ?Does the patient have difficulty dressing or bathing?: No ?Does the patient have difficulty doing errands alone such as visiting a doctor's office or shopping?: No ? ? ? ?Assessment & Plan ? ?1. Lipoma of skin and subcutaneous tissue of trunk ? ?- Ambulatory referral to General Surgery  ?

## 2021-04-02 LAB — URIC ACID: Uric Acid: 6.6 mg/dL (ref 3.8–8.4)

## 2021-04-02 LAB — TESTOSTERONE,FREE AND TOTAL
Testosterone, Free: 33.2 pg/mL — ABNORMAL HIGH (ref 6.6–18.1)
Testosterone: 1349 ng/dL — ABNORMAL HIGH (ref 264–916)

## 2021-04-02 LAB — HEMATOCRIT: Hematocrit: 56.3 % — ABNORMAL HIGH (ref 37.5–51.0)

## 2021-04-02 LAB — ESTRADIOL: Estradiol: 92.4 pg/mL — ABNORMAL HIGH (ref 7.6–42.6)

## 2021-04-02 LAB — PSA: Prostate Specific Ag, Serum: 2.2 ng/mL (ref 0.0–4.0)

## 2021-04-07 ENCOUNTER — Ambulatory Visit: Payer: BC Managed Care – PPO | Admitting: Surgery

## 2021-04-07 ENCOUNTER — Other Ambulatory Visit: Payer: Self-pay

## 2021-04-07 ENCOUNTER — Encounter: Payer: Self-pay | Admitting: Surgery

## 2021-04-07 VITALS — BP 148/96 | HR 73 | Temp 98.0°F | Ht 73.0 in | Wt 262.0 lb

## 2021-04-07 DIAGNOSIS — M7989 Other specified soft tissue disorders: Secondary | ICD-10-CM | POA: Diagnosis not present

## 2021-04-07 NOTE — Progress Notes (Signed)
Patient ID: Seth Meadows, male   DOB: 1960/11/20, 61 y.o.   MRN: [id number] ? ?Chief Complaint: Soft tissue mass right chest ? ?History of Present Illness ?Seth Meadows is a 61 y.o. male with a presumed lipoma present for 2 years, located along the lower ribs, midaxillary line.  He feels it is getting bigger and become more tender, giving it a pain out of 6 out of 10 at times.  Reports the pain is dull and achy.  He denies any nausea, vomiting, fevers and chills.  He may have a similar lesion at the vertebral prominence of his back. ? ?Past Medical History ?Past Medical History:  ?Diagnosis Date  ? Allergic rhinitis   ? BPH with obstruction/lower urinary tract symptoms   ? Cutaneous eruption   ? Earache   ? Epididymitis, left   ? Hyperglycemia   ? Hypertension   ? Hypogonadism in male   ? Kidney function test abnormal   ? Overweight   ? Scrotal pain   ? Sleep apnea   ?  ? ? ?Past Surgical History:  ?Procedure Laterality Date  ? EYELID LACERATION REPAIR  01/2017  ? cut the the eye lid to drain the infection out  ? WISDOM TOOTH EXTRACTION    ? ? ?Allergies  ?Allergen Reactions  ? Valsartan Other (See Comments)  ?  diarrhea  ? ? ?Current Outpatient Medications  ?Medication Sig Dispense Refill  ? amLODipine-benazepril (LOTREL) 5-20 MG capsule Take 1 capsule by mouth every evening. 90 capsule 1  ? Cholecalciferol (VITAMIN D3) 50 MCG (2000 UT) capsule Take 1 capsule (2,000 Units total) by mouth daily. 100 capsule 1  ? Cyanocobalamin (B-12) 1000 MCG SUBL Place 1 tablet under the tongue 3 (three) times a week. 30 tablet 0  ? cyclobenzaprine (FLEXERIL) 10 MG tablet Take 1 tablet (10 mg total) by mouth 3 (three) times daily as needed for muscle spasms. Take mainly at bedtime as may make drowsy, not to drive after taking 30 tablet 1  ? diclofenac Sodium (VOLTAREN) 1 % GEL Apply 2 g topically 4 (four) times daily. 100 g 0  ? hydrochlorothiazide (HYDRODIURIL) 12.5 MG tablet Take 1 tablet (12.5 mg total) by mouth every morning. 90  tablet 1  ? modafinil (PROVIGIL) 200 MG tablet Take 1 tablet (200 mg total) by mouth daily. 90 tablet 0  ? rosuvastatin (CRESTOR) 10 MG tablet Take 1 tablet (10 mg total) by mouth daily. 90 tablet 1  ? SYRINGE-NEEDLE, DISP, 3 ML (MONOJECT SAFETY SYRINGE/SHIELD) 23G X 1" 3 ML MISC Inject 61m intramuscular every 10days    ? testosterone cypionate (DEPOTESTOSTERONE CYPIONATE) 200 MG/ML injection Inject 100 mg into the muscle once a week.   0  ? ?No current facility-administered medications for this visit.  ? ? ?Family History ?Family History  ?Problem Relation Age of Onset  ? Hypertension Mother   ? Diabetes Mother   ? Hypertension Father   ? Prostate cancer Father   ? Thyroid disease Father   ? Skin cancer Sister   ? Diabetes Brother   ? Hypertension Brother   ? Kidney disease Neg Hx   ? Bladder Cancer Neg Hx   ?  ? ? ?Social History ?Social History  ? ?Tobacco Use  ? Smoking status: Never  ? Smokeless tobacco: Never  ?Vaping Use  ? Vaping Use: Never used  ?Substance Use Topics  ? Alcohol use: No  ?  Comment: occasional  ? Drug use: No  ?  ?  ? ? ?  Review of Systems  ?Constitutional: Negative.   ?HENT: Negative.    ?Eyes: Negative.   ?Respiratory: Negative.    ?Cardiovascular: Negative.   ?Gastrointestinal: Negative.   ?Genitourinary: Negative.   ?Skin: Negative.   ?Neurological: Negative.   ?Psychiatric/Behavioral: Negative.    ?  ? ?Physical Exam ?Blood pressure (!) 148/96, pulse 73, temperature 98 ?F (36.7 ?C), temperature source Oral, height '6\' 1"'$  (1.854 m), weight 262 lb (118.8 kg), SpO2 97 %. ?Last Weight  Most recent update: 04/07/2021  2:08 PM  ? ? Weight  ?118.8 kg (262 lb)  ?      ? ?  ? ? ?CONSTITUTIONAL: Well developed, and nourished, appropriately responsive and aware without distress.   ?EYES: Sclera non-icteric.   ?EARS, NOSE, MOUTH AND THROAT: Mask worn.  Hearing is intact to voice.  ?NECK: Trachea is midline, and there is no jugular venous distension.  ?LYMPH NODES:  Lymph nodes in the neck are not  enlarged. ?RESPIRATORY:  Lungs are clear, and breath sounds are equal bilaterally. Normal respiratory effort without pathologic use of accessory muscles. ?CARDIOVASCULAR: Heart is regular in rate and rhythm. ?GI: The abdomen is  soft, nontender, and nondistended. There were no palpable masses.  ?MUSCULOSKELETAL:  Symmetrical muscle tone appreciated in all four extremities.    ?SKIN: Skin turgor is normal. No pathologic skin lesions appreciated.  ?There is a well-defined soft tissue mass in the right mid axillary line overlying the lower ribs, 7 to 8 cm in longest diameter.  There is a similar lesion over the vertebral prominence posteriorly on his back, measuring approximately 3 to 4 cm there.  He reports the one on his back is nontender and not troublesome. ?NEUROLOGIC:  Motor and sensation appear grossly normal.  Cranial nerves are grossly without defect. ?PSYCH:  Alert and oriented to person, place and time. Affect is appropriate for situation. ? ?Data Reviewed ?I have personally reviewed what is currently available of the patient's imaging, recent labs and medical records.   ?Labs:  ?CBC Latest Ref Rng & Units 03/29/2021 02/04/2020 11/10/2019  ?WBC 3.8 - 10.8 Thousand/uL - 6.1 6.8  ?Hemoglobin 13.2 - 17.1 g/dL - 17.7(H) 18.4(H)  ?Hematocrit 37.5 - 51.0 % 56.3(H) 52.0(H) 55.5(H)  ?Platelets 140 - 400 Thousand/uL - 190 193  ? ?CMP Latest Ref Rng & Units 08/11/2020 02/04/2020 09/24/2019  ?Glucose 65 - 99 mg/dL 87 92 -  ?BUN 7 - 25 mg/dL 19 19 -  ?Creatinine 0.70 - 1.35 mg/dL 1.27 1.24 -  ?Sodium 135 - 146 mmol/L 139 140 -  ?Potassium 3.5 - 5.3 mmol/L 4.3 4.4 -  ?Chloride 98 - 110 mmol/L 104 104 -  ?CO2 20 - 32 mmol/L 29 29 -  ?Calcium 8.6 - 10.3 mg/dL 9.1 9.0 -  ?Total Protein 6.1 - 8.1 g/dL 7.1 6.6 7.1  ?Total Bilirubin 0.2 - 1.2 mg/dL 0.5 0.9 0.5  ?Alkaline Phos 48 - 121 IU/L - - 57  ?AST 10 - 35 U/L '26 28 29  '$ ?ALT 9 - 46 U/L '22 21 22  '$ ? ? ? ? ?Imaging: ? ?Within last 24 hrs: No results found. ? ?Assessment ?   ?Soft  tissue mass right chest wall, suspicious for lipoma. ?Patient Active Problem List  ? Diagnosis Date Noted  ? Pinched nerve in shoulder, left 10/23/2019  ? Soft tissue mass 10/06/2019  ? OSA on CPAP 02/28/2016  ? Vitamin D insufficiency 08/31/2015  ? Dyslipidemia 03/01/2015  ? Erectile dysfunction of organic origin 10/29/2014  ? Arthritis  of lumbar spine 10/22/2014  ? Intermittent low back pain 09/21/2014  ? Blood glucose elevated 08/17/2014  ? Hypogonadism in male 07/30/2014  ? Hypertension   ? ? ?Plan ?   ?We discussed options of proceeding to the OR for improved anesthesia/sedation and electrocautery/hemostatic control.  Options of performing it here in the office under strict local anesthetic were also discussed and the patient has elected to proceed with excision under local anesthesia.  We will bring him back in a near future visit for just that.  Risk discussed with him in detail, risks accepted. ? ?Face-to-face time spent with the patient and accompanying care providers(if present) was 25 minutes, with more than 50% of the time spent counseling, educating, and coordinating care of the patient.   ? ?These notes generated with voice recognition software. I apologize for typographical errors. ? ?Ronny Bacon M.D., FACS ?04/07/2021, 2:17 PM ? ? ? ? ?

## 2021-04-07 NOTE — Patient Instructions (Addendum)
If you have any concerns or questions, please feel free to call our office.  ? ?Lipoma Removal, Care After ?This sheet gives you information about how to care for yourself after your procedure. Your health care provider may also give you more specific instructions. If you have problems or questions, contact your health care provider. ?What can I expect after the procedure? ?After the procedure, it is common to have: ?Mild pain. ?Swelling. ?Bruising. ?Follow these instructions at home: ?Bathing ? ?Do not take baths, swim, or use a hot tub until your health care provider approves. Ask your health care provider if you may take showers. You may only be allowed to take sponge baths. ?Keep your bandage (dressing) dry until your health care provider says it can be removed. ?Incision care ? ?Follow instructions from your health care provider about how to take care of your incision. Make sure you: ?Wash your hands with soap and water for at least 20 seconds before and after you change your dressing. If soap and water are not available, use hand sanitizer. ?Change your dressing as told by your health care provider. ?Leave stitches (sutures), skin glue, or adhesive strips in place. These skin closures may need to stay in place for 2 weeks or longer. If adhesive strip edges start to loosen and curl up, you may trim the loose edges. Do not remove adhesive strips completely unless your health care provider tells you to do that. ?Check your incision area every day for signs of infection. Check for: ?More redness, swelling, or pain. ?Fluid or blood. ?Warmth. ?Pus or a bad smell. ?Medicines ?Take over-the-counter and prescription medicines only as told by your health care provider. ?If you were prescribed an antibiotic medicine, use it as told by your health care provider. Do not stop using the antibiotic even if you start to feel better. ?General instructions ? ?If you were given a sedative during the procedure, it can affect you  for several hours. Do not drive or operate machinery until your health care provider says that it is safe. ?Do not use any products that contain nicotine or tobacco, such as cigarettes, e-cigarettes, and chewing tobacco. These can delay healing. If you need help quitting, ask your health care provider. ?Return to your normal activities as told by your health care provider. Ask your health care provider what activities are safe for you. ?Keep all follow-up visits as told by your health care provider. This is important. ?Contact a health care provider if: ?You have more redness, swelling, or pain around your incision. ?You have fluid or blood coming from your incision. ?Your incision feels warm to the touch. ?You have pus or a bad smell coming from your incision. ?You have pain that does not get better with medicine. ?Get help right away if: ?You have chills or a fever. ?You have severe pain. ?Summary ?After the procedure, it is common to have mild pain, swelling, and bruising. ?Follow instructions from your health care provider about how to take care of your incision. ?Check your incision area every day for signs of infection. ?Contact a health care provider if you have more redness, swelling, or pain around your incision. ?This information is not intended to replace advice given to you by your health care provider. Make sure you discuss any questions you have with your health care provider. ?Document Revised: 08/26/2018 Document Reviewed: 08/26/2018 ?Elsevier Patient Education ? [place]. ? ?

## 2021-04-14 ENCOUNTER — Ambulatory Visit: Payer: BC Managed Care – PPO | Admitting: Surgery

## 2021-04-14 ENCOUNTER — Other Ambulatory Visit: Payer: Self-pay | Admitting: Surgery

## 2021-04-14 ENCOUNTER — Encounter: Payer: Self-pay | Admitting: Surgery

## 2021-04-14 ENCOUNTER — Other Ambulatory Visit: Payer: Self-pay

## 2021-04-14 VITALS — BP 126/86 | HR 84 | Temp 98.1°F | Ht 73.0 in | Wt 259.0 lb

## 2021-04-14 DIAGNOSIS — D171 Benign lipomatous neoplasm of skin and subcutaneous tissue of trunk: Secondary | ICD-10-CM | POA: Diagnosis not present

## 2021-04-14 NOTE — Progress Notes (Signed)
Excision of right flank soft tissue mass, intramuscular, 10 cm. ? ?Pre-operative Diagnosis: 8 cm soft tissue mass right flank. ? ?Post-operative Diagnosis: 10 cm intramuscular right flank soft tissue mass consistent with lipoma.  Final pathology pending. ? ?Surgeon: Ronny Bacon, M.D., FACS ? ?Anesthesia: Local ? ?Findings: Involving the fibers of the latissimus/serratus of the right chest wall is a large well-defined mature lipomatous mass of 10 cm in length, shape cylindrically to about 4 cm in diameter. ? ?Estimated Blood Loss: 5 mL ?        ?Specimens: Soft tissue mass consistent with mature lipoma involving muscle fibers of the right chest wall.    ?      ?Complications: none ?        ?     ?Procedure Details  ?The benefits, complications, treatment options, and expected outcomes were discussed with the patient. The risks of bleeding, infection, recurrence of symptoms, failure to resolve symptoms, unanticipated injury, prosthetic placement, prosthetic infection, any of which could require further surgery were reviewed with the patient. The likelihood of improving the patient's symptoms with return to their baseline status is expected.  The patient and/or family concurred with the proposed plan, giving informed consent.  The patient was taken to Room 9, identified and the procedure verified.   ? ?The patient was positioned in the left lateral decubitus position and the right chest wall was prepped with  Chloraprep and draped in the sterile fashion.  ?A Time Out was held and the above information confirmed. ? ?A transverse incision was made over the marked mass, sharp division of soft tissues was continued down through the subcutaneous tissues to approach the mass.  Hemostasis obtained with electrocautery and clamps.  Finally made a division of the deep muscular fascia, and was able to enter the plane of the lipomatous lesion.  The lesion shelled out with blunt digital dissection anteriorly and inferiorly  running parallel to the ribs, however posteriorly it seemed to be integrated into the latissimus dorsi muscle, possibly some serratus.  We proceeded with blunt dissection to shell it out from there and sharply divided a few of the muscle fibers to remove the specimen completely.  This did appear to be consistent with a mature lipoma.  We then obtained adequate hemostasis, and then closed the incision in layers with a deep layer of 3-0 Vicryl, dermal layer of 3-0 Vicryl, and a running subcuticular of 4-0 Monocryl.  We then sealed the incision with Dermabond.  Overall he tolerated procedure well. ? ? ? ?Ronny Bacon M.D., FACS ?[facility] ?04/14/2021 4:48 PM   ?

## 2021-04-14 NOTE — Patient Instructions (Signed)
Today we have removed a Lipoma in our office. Please see information below regarding this type of tumor. ? ?You are free to shower in 24 hours. This will be on 04/15/21. ? ?You have glue on your skin and sutures under the skin. The glue will come off on it's own in 10-14 days. You may shower normally until this occurs but do not submerge. ? ?Please use Tylenol or Ibuprofen for pain as needed. ? ?We will see you back in 2 weeks to ensure that this has healed and to review the final pathology. Please see your appointment below. You may continue your regular activities right away but if you are having pain while doing something, stop what you are doing and try this activity once again in 3 days. Please call our office with any questions or concerns prior to your appointment. ? ? ?Lipoma Removal ?Lipoma removal is a surgical procedure to remove a noncancerous (benign) tumor that is made up of fat cells (lipoma). Most lipomas are small and painless and do not require treatment. They can form in many areas of the body but are most common under the skin of the back, shoulders, arms, and thighs. ?You may need lipoma removal if you have a lipoma that is large, growing, or causing discomfort. Lipoma removal may also be done for cosmetic reasons. ?Tell a health care provider about: ?Any allergies you have. ?All medicines you are taking, including vitamins, herbs, eye drops, creams, and over-the-counter medicines. ?Any problems you or family members have had with anesthetic medicines. ?Any blood disorders you have. ?Any surgeries you have had. ?Any medical conditions you have. ?Whether you are pregnant or may be pregnant. ?What are the risks? ?Generally, this is a safe procedure. However, problems may occur, including: ?Infection. ?Bleeding. ?Allergic reactions to medicines. ?Damage to nerves or blood vessels near the lipoma. ?Scarring. ? ?What happens before the procedure? ?Staying hydrated ?Follow instructions from your health  care provider about hydration, which may include: ?Up to 2 hours before the procedure - you may continue to drink clear liquids, such as water, clear fruit juice, black coffee, and plain tea. ? ?Eating and drinking restrictions ?Follow instructions from your health care provider about eating and drinking, which may include: ?8 hours before the procedure - stop eating heavy meals or foods such as meat, fried foods, or fatty foods. ?6 hours before the procedure - stop eating light meals or foods, such as toast or cereal. ?6 hours before the procedure - stop drinking milk or drinks that contain milk. ?2 hours before the procedure - stop drinking clear liquids. ? ?Medicines ?Ask your health care provider about: ?Changing or stopping your regular medicines. This is especially important if you are taking diabetes medicines or blood thinners. ?Taking medicines such as aspirin and ibuprofen. These medicines can thin your blood. Do not take these medicines before your procedure if your health care provider instructs you not to. ?You may be given antibiotic medicine to help prevent infection. ?General instructions ?Ask your health care provider how your surgical site will be marked or identified. ?You will have a physical exam. Your health care provider will check the size of the lipoma and whether it can be moved easily. ?You may have imaging tests, such as: ?X-rays. ?CT scan. ?MRI. ?Plan to have someone take you home from the hospital or clinic. ?What happens during the procedure? ?To reduce your risk of infection: ?Your health care team will wash or sanitize their hands. ?Your skin will  be washed with soap. ?You will be given one or more of the following: ?A medicine to help you relax (sedative). ?A medicine to numb the area (local anesthetic). ?A medicine to make you fall asleep (general anesthetic). ?A medicine that is injected into an area of your body to numb everything below the injection site (regional  anesthetic). ?An incision will be made over the lipoma or very near the lipoma. The incision may be made in a natural skin line or crease. ?Tissues, nerves, and blood vessels near the lipoma will be moved out of the way. ?The lipoma and the capsule that surrounds it will be separated from the surrounding tissues. ?The lipoma will be removed. ?The incision may be closed with stitches (sutures). ?A bandage (dressing) will be placed over the incision. ?What happens after the procedure? ?Do not drive for 24 hours if you received a sedative. ?Your blood pressure, heart rate, breathing rate, and blood oxygen level will be monitored until the medicines you were given have worn off. ?This information is not intended to replace advice given to you by your health care provider. Make sure you discuss any questions you have with your health care provider. ?Document Released: 03/25/2015 Document Revised: 06/17/2015 Document Reviewed: 03/25/2015 ?Elsevier Interactive Patient Education ? 2018 [place]. ? ?

## 2021-04-21 ENCOUNTER — Encounter: Payer: Self-pay | Admitting: Surgery

## 2021-04-21 ENCOUNTER — Ambulatory Visit (INDEPENDENT_AMBULATORY_CARE_PROVIDER_SITE_OTHER): Payer: BC Managed Care – PPO | Admitting: Surgery

## 2021-04-21 VITALS — BP 110/66 | HR 90 | Temp 97.8°F | Ht 73.0 in | Wt 260.8 lb

## 2021-04-21 DIAGNOSIS — D171 Benign lipomatous neoplasm of skin and subcutaneous tissue of trunk: Secondary | ICD-10-CM

## 2021-04-21 DIAGNOSIS — Z09 Encounter for follow-up examination after completed treatment for conditions other than malignant neoplasm: Secondary | ICD-10-CM

## 2021-04-21 NOTE — Patient Instructions (Signed)
If you have any concerns or questions, please feel free to call our office. Follow up as needed.  ? ?Lipoma Removal, Care After ?This sheet gives you information about how to care for yourself after your procedure. Your health care provider may also give you more specific instructions. If you have problems or questions, contact your health care provider. ?What can I expect after the procedure? ?After the procedure, it is common to have: ?Mild pain. ?Swelling. ?Bruising. ?Follow these instructions at home: ?Bathing ? ?Do not take baths, swim, or use a hot tub until your health care provider approves. Ask your health care provider if you may take showers. You may only be allowed to take sponge baths. ?Keep your bandage (dressing) dry until your health care provider says it can be removed. ?Incision care ? ?Follow instructions from your health care provider about how to take care of your incision. Make sure you: ?Wash your hands with soap and water for at least 20 seconds before and after you change your dressing. If soap and water are not available, use hand sanitizer. ?Change your dressing as told by your health care provider. ?Leave stitches (sutures), skin glue, or adhesive strips in place. These skin closures may need to stay in place for 2 weeks or longer. If adhesive strip edges start to loosen and curl up, you may trim the loose edges. Do not remove adhesive strips completely unless your health care provider tells you to do that. ?Check your incision area every day for signs of infection. Check for: ?More redness, swelling, or pain. ?Fluid or blood. ?Warmth. ?Pus or a bad smell. ?Medicines ?Take over-the-counter and prescription medicines only as told by your health care provider. ?If you were prescribed an antibiotic medicine, use it as told by your health care provider. Do not stop using the antibiotic even if you start to feel better. ?General instructions ? ?If you were given a sedative during the  procedure, it can affect you for several hours. Do not drive or operate machinery until your health care provider says that it is safe. ?Do not use any products that contain nicotine or tobacco, such as cigarettes, e-cigarettes, and chewing tobacco. These can delay healing. If you need help quitting, ask your health care provider. ?Return to your normal activities as told by your health care provider. Ask your health care provider what activities are safe for you. ?Keep all follow-up visits as told by your health care provider. This is important. ?Contact a health care provider if: ?You have more redness, swelling, or pain around your incision. ?You have fluid or blood coming from your incision. ?Your incision feels warm to the touch. ?You have pus or a bad smell coming from your incision. ?You have pain that does not get better with medicine. ?Get help right away if: ?You have chills or a fever. ?You have severe pain. ?Summary ?After the procedure, it is common to have mild pain, swelling, and bruising. ?Follow instructions from your health care provider about how to take care of your incision. ?Check your incision area every day for signs of infection. ?Contact a health care provider if you have more redness, swelling, or pain around your incision. ?This information is not intended to replace advice given to you by your health care provider. Make sure you discuss any questions you have with your health care provider. ?Document Revised: 08/26/2018 Document Reviewed: 08/26/2018 ?Elsevier Patient Education ? [place]. ? ?

## 2021-04-21 NOTE — Progress Notes (Signed)
[facility] ?POST-OP OFFICE VISIT ? ?04/21/2021 ? ?HPI: ?Seth Meadows is a 61 y.o. male 7 days s/p excision of right lat dorsi intramuscular lipoma.  He reports intermittent stinging pain involving the incisional area, seemingly more with motion.  Wanted to know if he could resume playing softball. ? ?Vital signs: ?BP 110/66   Pulse 90   Temp 97.8 ?F (36.6 ?C) (Oral)   Ht '6\' 1"'$  (1.854 m)   Wt 260 lb 12.8 oz (118.3 kg)   SpO2 95%   BMI 34.41 kg/m?   ? ?Physical Exam: ?Constitutional: He appears well. ? ?Skin: Has a mild amount of resolving ecchymosis surrounding the incision.  Dermabond is intact.  There is a slight raised nature of the scar line, all this is pretty much as expected.  No obvious objective tenderness to palpation. ? ?Assessment/Plan: ?This is a 61 y.o. male 7 days s/p excision of right latissimus dorsi intramuscular lipoma. ? ?Patient Active Problem List  ? Diagnosis Date Noted  ? Pinched nerve in shoulder, left 10/23/2019  ? Soft tissue mass 10/06/2019  ? OSA on CPAP 02/28/2016  ? Vitamin D insufficiency 08/31/2015  ? Dyslipidemia 03/01/2015  ? Erectile dysfunction of organic origin 10/29/2014  ? Arthritis of lumbar spine 10/22/2014  ? Intermittent low back pain 09/21/2014  ? Blood glucose elevated 08/17/2014  ? Hypogonadism in male 07/30/2014  ? Hypertension   ? ? -May follow-up as needed, I have liberalized his activity level to proceed with caution to what ever he enjoys and wants to do.  Do not expect any significant issues from the latissimus dorsi, as so few muscle fibers were transected in the excision.  We will be glad to see this gentleman back as needed. ? ? ?Ronny Bacon M.D., FACS ?04/21/2021, 10:50 AM  ?

## 2021-05-13 NOTE — Progress Notes (Signed)
Name: Seth Meadows   MRN: [id number]    DOB: 1960/02/07   Date:05/16/2021 ? ?     Progress Note ? ?Subjective ? ?Chief Complaint ? ?Follow Up ? ?HPI ? ?OSA: he  Is compliant with CPAP machine , he is sleeping better with new job, regular hours.  ? ?He has noticed pain on right index DIP over the 4 months and is now using topical medication and seems to be helping with pain ? ?Hypogonadism:  seeing Urologist Dr. Lonie Peak, using Trimix injections and taking testosterone 75 mg weekly, his HCT continues to be elevated , that is why dose was decreased recently  ? ?Dyslipidemia: he is back on statin therapy no side effects, last LDL was better, down from 120 to 77 he was on 5 mg but we increased the dose to 10 mg and we will recheck labs during his CPE ? ?Change in bowel movements: improved with change in jobs  ?  ?HTN: he is on Lotrel 5/20 and HCTZ, occasionally has dizziness when stooping down and getting up quickly, denies chest pain or sob  ? ?Dizziness : usually happens when going up the stairs, he can go to the gym and work out for over 30 minutes without problems. He never had a syncopal episode, usually lasts a few seconds and resolves with rest. He has seen cardiologist - he had stress test and echo done in 2015, he states started after he started taking bp medication, usually after he goes up the stairs and gets to the regular flat path when he gets dizzy. It is not associated with chest pain, diaphoresis or SOB. He is not going up stairs as often with new job. Doing well at this time  ? ?Vitamin D deficiency and B12 deficiency: taking supplementation ? ?Patient Active Problem List  ? Diagnosis Date Noted  ? Pinched nerve in shoulder, left 10/23/2019  ? Soft tissue mass 10/06/2019  ? OSA on CPAP 02/28/2016  ? Vitamin D insufficiency 08/31/2015  ? Dyslipidemia 03/01/2015  ? Erectile dysfunction of organic origin 10/29/2014  ? Arthritis of lumbar spine 10/22/2014  ? Intermittent low back pain 09/21/2014  ? Blood glucose  elevated 08/17/2014  ? Hypogonadism in male 07/30/2014  ? Hypertension   ? ? ?Past Surgical History:  ?Procedure Laterality Date  ? EYELID LACERATION REPAIR  01/2017  ? cut the the eye lid to drain the infection out  ? WISDOM TOOTH EXTRACTION    ? ? ?Family History  ?Problem Relation Age of Onset  ? Hypertension Mother   ? Diabetes Mother   ? Hypertension Father   ? Prostate cancer Father   ? Thyroid disease Father   ? Skin cancer Sister   ? Diabetes Brother   ? Hypertension Brother   ? Kidney disease Neg Hx   ? Bladder Cancer Neg Hx   ? ? ?Social History  ? ?Tobacco Use  ? Smoking status: Never  ? Smokeless tobacco: Never  ?Substance Use Topics  ? Alcohol use: No  ?  Comment: occasional  ? ? ? ?Current Outpatient Medications:  ?  amLODipine-benazepril (LOTREL) 5-20 MG capsule, Take 1 capsule by mouth every evening., Disp: 90 capsule, Rfl: 1 ?  Cholecalciferol (VITAMIN D3) 50 MCG (2000 UT) capsule, Take 1 capsule (2,000 Units total) by mouth daily., Disp: 100 capsule, Rfl: 1 ?  Cyanocobalamin (B-12) 1000 MCG SUBL, Place 1 tablet under the tongue 3 (three) times a week., Disp: 30 tablet, Rfl: 0 ?  diclofenac Sodium (VOLTAREN)  1 % GEL, Apply 2 g topically 4 (four) times daily., Disp: 100 g, Rfl: 0 ?  hydrochlorothiazide (HYDRODIURIL) 12.5 MG tablet, Take 1 tablet (12.5 mg total) by mouth every morning., Disp: 90 tablet, Rfl: 1 ?  modafinil (PROVIGIL) 200 MG tablet, Take 1 tablet (200 mg total) by mouth daily., Disp: 90 tablet, Rfl: 0 ?  rosuvastatin (CRESTOR) 10 MG tablet, Take 1 tablet (10 mg total) by mouth daily., Disp: 90 tablet, Rfl: 1 ?  SYRINGE-NEEDLE, DISP, 3 ML (MONOJECT SAFETY SYRINGE/SHIELD) 23G X 1" 3 ML MISC, Inject 61m intramuscular every 10days, Disp: , Rfl:  ?  testosterone cypionate (DEPOTESTOSTERONE CYPIONATE) 200 MG/ML injection, Inject 100 mg into the muscle once a week. , Disp: , Rfl: 0 ?  testosterone cypionate (DEPOTESTOSTERONE CYPIONATE) 200 MG/ML injection, Inject into the muscle., Disp: ,  Rfl:  ? ?Allergies  ?Allergen Reactions  ? Valsartan Other (See Comments)  ?  diarrhea  ? ? ?I personally reviewed active problem list, medication list, allergies, family history, social history, health maintenance with the patient/caregiver today. ? ? ?ROS ? ?Constitutional: Negative for fever or weight change.  ?Respiratory: Negative for cough and shortness of breath.   ?Cardiovascular: Negative for chest pain or palpitations.  ?Gastrointestinal: Negative for abdominal pain, no bowel changes.  ?Musculoskeletal: Negative for gait problem or joint swelling.  ?Skin: Negative for rash.  ?Neurological: Negative for dizziness or headache.  ?No other specific complaints in a complete review of systems (except as listed in HPI above).  ? ?Objective ? ?Vitals:  ? 05/16/21 1540  ?BP: 122/84  ?Pulse: 87  ?Resp: 16  ?SpO2: 95%  ?Weight: 263 lb (119.3 kg)  ?Height: '6\' 1"'$  (1.854 m)  ? ? ?Body mass index is 34.7 kg/m?. ? ?Physical Exam ? ?Constitutional: Patient appears well-developed and well-nourished. Obese  No distress.  ?HEENT: head atraumatic, normocephalic, pupils equal and reactive to light, neck supple ?Cardiovascular: Normal rate, regular rhythm and normal heart sounds.  No murmur heard. No BLE edema. ?Pulmonary/Chest: Effort normal and breath sounds normal. No respiratory distress. ?Abdominal: Soft.  There is no tenderness. ?Psychiatric: Patient has a normal mood and affect. behavior is normal. Judgment and thought content normal.  ? ?Recent Results (from the past 2160 hour(s))  ?Testosterone,Free and Total     Status: Abnormal  ? Collection Time: 03/29/21  8:33 AM  ?Result Value Ref Range  ? Testosterone 1,349 (H) 264 - 916 ng/dL  ?  Comment: Adult male reference interval is based on a population of ?healthy nonobese males (BMI <30) between 150and 62years old. ?[place] [id number],102;[id number]. PMID: 293903009 ?  ? Testosterone, Free 33.2 (H) 6.6 - 18.1 pg/mL  ?Estradiol     Status: Abnormal  ? Collection  Time: 03/29/21  8:33 AM  ?Result Value Ref Range  ? Estradiol 92.4 (H) 7.6 - 42.6 pg/mL  ?  Comment: Roche ECLIA methodology  ?Hematocrit     Status: Abnormal  ? Collection Time: 03/29/21  8:33 AM  ?Result Value Ref Range  ? Hematocrit 56.3 (H) 37.5 - 51.0 %  ?PSA     Status: None  ? Collection Time: 03/29/21  8:33 AM  ?Result Value Ref Range  ? Prostate Specific Ag, Serum 2.2 0.0 - 4.0 ng/mL  ?  Comment: Roche ECLIA methodology. ?According to the American Urological Association, Serum PSA should ?decrease and remain at undetectable levels after radical ?prostatectomy. The AUA defines biochemical recurrence as an initial ?PSA value 0.2 ng/mL or greater followed by  a subsequent confirmatory ?PSA value 0.2 ng/mL or greater. ?Values obtained with different assay methods or kits cannot be used ?interchangeably. Results cannot be interpreted as absolute evidence ?of the presence or absence of malignant disease. ?  ?Uric acid     Status: None  ? Collection Time: 03/29/21  8:33 AM  ?Result Value Ref Range  ? Uric Acid 6.6 3.8 - 8.4 mg/dL  ?  Comment:            Therapeutic target for gout patients: <6.0  ? ? ?PHQ2/9: ? ?  05/16/2021  ?  3:40 PM 03/29/2021  ? 10:18 AM 03/25/2021  ? 11:19 AM 11/11/2020  ?  8:15 AM 08/11/2020  ?  7:55 AM  ?Depression screen PHQ 2/9  ?Decreased Interest 0 0 0 0 0  ?Down, Depressed, Hopeless 0 0 0 0 0  ?PHQ - 2 Score 0 0 0 0 0  ?Altered sleeping 0 0 0 0   ?Tired, decreased energy 0 0 0 0   ?Change in appetite 0 0 0 0   ?Feeling bad or failure about yourself  0 0 0 0   ?Trouble concentrating 0 0 0 0   ?Moving slowly or fidgety/restless 0 0 0 0   ?Suicidal thoughts 0 0 0 0   ?PHQ-9 Score 0 0 0 0   ?Difficult doing work/chores   Not difficult at all Not difficult at all   ?  ?phq 9 is negative ? ? ?Fall Risk: ? ?  05/16/2021  ?  3:40 PM 04/14/2021  ?  3:39 PM 03/29/2021  ? 10:18 AM 03/25/2021  ? 11:19 AM 11/11/2020  ?  8:14 AM  ?Fall Risk   ?Falls in the past year? 0 0 0 0 0  ?Number falls in past yr: 0  0 0  0  ?Injury with Fall? 0  0 0 0  ?Risk for fall due to : No Fall Risks  No Fall Risks No Fall Risks No Fall Risks  ?Follow up Falls prevention discussed  Falls prevention discussed Falls prevention discusse

## 2021-05-16 ENCOUNTER — Encounter: Payer: Self-pay | Admitting: Family Medicine

## 2021-05-16 ENCOUNTER — Ambulatory Visit (INDEPENDENT_AMBULATORY_CARE_PROVIDER_SITE_OTHER): Payer: BC Managed Care – PPO | Admitting: Family Medicine

## 2021-05-16 VITALS — BP 122/84 | HR 87 | Resp 16 | Ht 73.0 in | Wt 263.0 lb

## 2021-05-16 DIAGNOSIS — E785 Hyperlipidemia, unspecified: Secondary | ICD-10-CM | POA: Diagnosis not present

## 2021-05-16 DIAGNOSIS — Z131 Encounter for screening for diabetes mellitus: Secondary | ICD-10-CM

## 2021-05-16 DIAGNOSIS — Z9989 Dependence on other enabling machines and devices: Secondary | ICD-10-CM

## 2021-05-16 DIAGNOSIS — G4733 Obstructive sleep apnea (adult) (pediatric): Secondary | ICD-10-CM | POA: Diagnosis not present

## 2021-05-16 DIAGNOSIS — E538 Deficiency of other specified B group vitamins: Secondary | ICD-10-CM | POA: Diagnosis not present

## 2021-05-16 DIAGNOSIS — M79644 Pain in right finger(s): Secondary | ICD-10-CM | POA: Diagnosis not present

## 2021-05-16 DIAGNOSIS — E559 Vitamin D deficiency, unspecified: Secondary | ICD-10-CM

## 2021-05-16 DIAGNOSIS — E669 Obesity, unspecified: Secondary | ICD-10-CM

## 2021-05-16 DIAGNOSIS — I10 Essential (primary) hypertension: Secondary | ICD-10-CM

## 2021-05-16 DIAGNOSIS — R718 Other abnormality of red blood cells: Secondary | ICD-10-CM

## 2021-05-16 DIAGNOSIS — E291 Testicular hypofunction: Secondary | ICD-10-CM

## 2021-05-16 MED ORDER — HYDROCHLOROTHIAZIDE 12.5 MG PO TABS: 12.5000 mg | ORAL_TABLET | ORAL | 1 refills | Status: DC

## 2021-05-16 MED ORDER — ROSUVASTATIN CALCIUM 10 MG PO TABS: 10.0000 mg | ORAL_TABLET | Freq: Every day | ORAL | 1 refills | Status: DC

## 2021-05-16 MED ORDER — AMLODIPINE BESY-BENAZEPRIL HCL 5-20 MG PO CAPS: 1.0000 | ORAL_CAPSULE | Freq: Every evening | ORAL | 1 refills | Status: DC

## 2021-06-02 ENCOUNTER — Ambulatory Visit (INDEPENDENT_AMBULATORY_CARE_PROVIDER_SITE_OTHER): Payer: BC Managed Care – PPO | Admitting: Family Medicine

## 2021-06-02 ENCOUNTER — Encounter: Payer: Self-pay | Admitting: Family Medicine

## 2021-06-02 VITALS — BP 132/70 | HR 79 | Resp 16 | Ht 73.0 in | Wt 262.0 lb

## 2021-06-02 DIAGNOSIS — Z0000 Encounter for general adult medical examination without abnormal findings: Secondary | ICD-10-CM

## 2021-06-02 LAB — HEMOGLOBIN A1C
Hgb A1c MFr Bld: 5.3 % of total Hgb (ref <5.7)
Mean Plasma Glucose: 105 mg/dL
eAG (mmol/L): 5.8 mmol/L

## 2021-06-02 LAB — COMPLETE METABOLIC PANEL WITH GFR
AG Ratio: 1.3 (calc) (ref 1.0–2.5)
ALT: 31 U/L (ref 9–46)
AST: 36 U/L — ABNORMAL HIGH (ref 10–35)
Albumin: 4 g/dL (ref 3.6–5.1)
Alkaline phosphatase (APISO): 46 U/L (ref 35–144)
BUN: 21 mg/dL (ref 7–25)
CO2: 30 mmol/L (ref 20–32)
Calcium: 9.3 mg/dL (ref 8.6–10.3)
Chloride: 103 mmol/L (ref 98–110)
Creat: 1.34 mg/dL (ref 0.70–1.35)
Globulin: 3.2 g/dL (calc) (ref 1.9–3.7)
Glucose, Bld: 90 mg/dL (ref 65–99)
Potassium: 4.7 mmol/L (ref 3.5–5.3)
Sodium: 140 mmol/L (ref 135–146)
Total Bilirubin: 0.6 mg/dL (ref 0.2–1.2)
Total Protein: 7.2 g/dL (ref 6.1–8.1)
eGFR: 60 mL/min/{1.73_m2} (ref >=60)

## 2021-06-02 LAB — VITAMIN B12: Vitamin B-12: 645 pg/mL (ref 200–1100)

## 2021-06-02 LAB — CBC WITH DIFFERENTIAL/PLATELET
Absolute Monocytes: 621 cells/uL (ref 200–950)
Basophils Absolute: 70 cells/uL (ref 0–200)
Basophils Relative: 1.1 %
Eosinophils Absolute: 198 cells/uL (ref 15–500)
Eosinophils Relative: 3.1 %
HCT: 54.5 % — ABNORMAL HIGH (ref 38.5–50.0)
Hemoglobin: 18.4 g/dL — ABNORMAL HIGH (ref 13.2–17.1)
Lymphs Abs: 2042 cells/uL (ref 850–3900)
MCH: 29.1 pg (ref 27.0–33.0)
MCHC: 33.8 g/dL (ref 32.0–36.0)
MCV: 86.2 fL (ref 80.0–100.0)
MPV: 10.8 fL (ref 7.5–12.5)
Monocytes Relative: 9.7 %
Neutro Abs: 3469 cells/uL (ref 1500–7800)
Neutrophils Relative %: 54.2 %
Platelets: 175 10*3/uL (ref 140–400)
RBC: 6.32 10*6/uL — ABNORMAL HIGH (ref 4.20–5.80)
RDW: 13.4 % (ref 11.0–15.0)
Total Lymphocyte: 31.9 %
WBC: 6.4 10*3/uL (ref 3.8–10.8)

## 2021-06-02 LAB — LIPID PANEL
Cholesterol: 135 mg/dL (ref <200)
HDL: 41 mg/dL (ref >=40)
LDL Cholesterol (Calc): 78 mg/dL (calc)
Non-HDL Cholesterol (Calc): 94 mg/dL (calc) (ref <130)
Total CHOL/HDL Ratio: 3.3 (calc) (ref <5.0)
Triglycerides: 84 mg/dL (ref <150)

## 2021-06-02 LAB — VITAMIN D 25 HYDROXY (VIT D DEFICIENCY, FRACTURES): Vit D, 25-Hydroxy: 43 ng/mL (ref 30–100)

## 2021-06-02 NOTE — Patient Instructions (Signed)

## 2021-06-02 NOTE — Progress Notes (Signed)
Name: Seth Meadows   MRN: [id number]    DOB: 06-09-1960   Date:06/02/2021 ? ?     Progress Note ? ?Subjective ? ?Chief Complaint ? ?Follow Up ? ?HPI ? ?Patient presents for annual CPE. ? ?IPSS Questionnaire (AUA-7): ?Over the past month?   ?1)  How often have you had a sensation of not emptying your bladder completely after you finish urinating?  0 - Not at all  ?2)  How often have you had to urinate again less than two hours after you finished urinating? 0 - Not at all  ?3)  How often have you found you stopped and started again several times when you urinated?  0 - Not at all  ?4) How difficult have you found it to postpone urination?  0 - Not at all  ?5) How often have you had a weak urinary stream?  0 - Not at all  ?6) How often have you had to push or strain to begin urination?  0 - Not at all  ?7) How many times did you most typically get up to urinate from the time you went to bed until the time you got up in the morning?  0 - None  ?Total score:  0-7 mildly symptomatic  ? 8-19 moderately symptomatic  ? 20-35 severely symptomatic  ?  ? ?Diet: avoiding fried food, eating a lot of fruit and vegetables, drinks water ?Exercise: continue regular physical activity  ? ?Depression: phq 9 is negative ? ?  06/02/2021  ? 10:17 AM 05/16/2021  ?  3:40 PM 03/29/2021  ? 10:18 AM 03/25/2021  ? 11:19 AM 11/11/2020  ?  8:15 AM  ?Depression screen PHQ 2/9  ?Decreased Interest 0 0 0 0 0  ?Down, Depressed, Hopeless 0 0 0 0 0  ?PHQ - 2 Score 0 0 0 0 0  ?Altered sleeping 0 0 0 0 0  ?Tired, decreased energy 0 0 0 0 0  ?Change in appetite 0 0 0 0 0  ?Feeling bad or failure about yourself  0 0 0 0 0  ?Trouble concentrating 0 0 0 0 0  ?Moving slowly or fidgety/restless 0 0 0 0 0  ?Suicidal thoughts 0 0 0 0 0  ?PHQ-9 Score 0 0 0 0 0  ?Difficult doing work/chores    Not difficult at all Not difficult at all  ? ? ?Hypertension:  ?BP Readings from Last 3 Encounters:  ?06/02/21 132/70  ?05/16/21 122/84  ?04/21/21 110/66  ? ? ?Obesity: ?Wt Readings  from Last 3 Encounters:  ?06/02/21 262 lb (118.8 kg)  ?05/16/21 263 lb (119.3 kg)  ?04/21/21 260 lb 12.8 oz (118.3 kg)  ? ?BMI Readings from Last 3 Encounters:  ?06/02/21 34.57 kg/m?  ?05/16/21 34.70 kg/m?  ?04/21/21 34.41 kg/m?  ?  ? ?Lipids:  ?Lab Results  ?Component Value Date  ? CHOL 135 06/01/2021  ? CHOL 135 08/11/2020  ? CHOL 172 02/04/2020  ? ?Lab Results  ?Component Value Date  ? HDL 41 06/01/2021  ? HDL 43 08/11/2020  ? HDL 35 (L) 02/04/2020  ? ?Lab Results  ?Component Value Date  ? [place] 78 06/01/2021  ? [place] 77 08/11/2020  ? LDLCALC 120 (H) 02/04/2020  ? ?Lab Results  ?Component Value Date  ? TRIG 84 06/01/2021  ? TRIG 73 08/11/2020  ? TRIG 71 02/04/2020  ? ?Lab Results  ?Component Value Date  ? CHOLHDL 3.3 06/01/2021  ? CHOLHDL 3.1 08/11/2020  ? CHOLHDL 4.9 02/04/2020  ? ?No  results found for: LDLDIRECT ?Glucose:  ?Glucose, Bld  ?Date Value Ref Range Status  ?06/01/2021 90 65 - 99 mg/dL Final  ?  Comment:  ?  . ?           Fasting reference interval ?. ?  ?08/11/2020 87 65 - 99 mg/dL Final  ?  Comment:  ?  . ?           Fasting reference interval ?. ?  ?02/04/2020 92 65 - 99 mg/dL Final  ?  Comment:  ?  . ?           Fasting reference interval ?. ?  ? ? ?[place] [facility]  ?AUDIT-C Score 0  ? ?  ? ? ?Married ?STD testing and prevention (HIV/chl/gon/syphilis): 11/19/18 ?Sexual history: libido improved with testosterone, uses cialis when traveling and Tri-mix at home prn ED ?Hep C Screening: 07/12/16 ?Skin cancer: Discussed monitoring for atypical lesions ?Colorectal cancer: 02/25/12 ?Prostate cancer:  takes testosterone and had PSA done by Urologist  ? ? ?Lung cancer:  Low Dose CT Chest recommended if Age 61-80 years, 30 pack-year currently smoking OR have quit w/in 15years. Patient  no a candidate for screening   ?AAA: The USPSTF recommends one-time screening with ultrasonography in men ages 34 to 56 years who have ever smoked. Patient   no, a  candidate for screening  ?ECG:  12/02/19 ? ?Vaccines:  ? ?HPV: N/A ?Tdap: up to date ?Shingrix: up to date ?Pneumonia: N/A ?Flu: up to date ?COVID-19: discussed bivalent booster  ? ?Advanced Care Planning: A voluntary discussion about advance care planning including the explanation and discussion of advance directives.  Discussed health care proxy and Living will, and the patient was able to identify a health care proxy as wife.  Patient does not have a living will and power of attorney of health care  ? ?Patient Active Problem List  ? Diagnosis Date Noted  ? Pinched nerve in shoulder, left 10/23/2019  ? OSA on CPAP 02/28/2016  ? Vitamin D insufficiency 08/31/2015  ? Dyslipidemia 03/01/2015  ? Erectile dysfunction of organic origin 10/29/2014  ? Arthritis of lumbar spine 10/22/2014  ? Intermittent low back pain 09/21/2014  ? Blood glucose elevated 08/17/2014  ? Hypogonadism in male 07/30/2014  ? Hypertension   ? ? ?Past Surgical History:  ?Procedure Laterality Date  ? EYELID LACERATION REPAIR  01/2017  ? cut the the eye lid to drain the infection out  ? WISDOM TOOTH EXTRACTION    ? ? ?Family History  ?Problem Relation Age of Onset  ? Hypertension Mother   ? Diabetes Mother   ? Hypertension Father   ? Prostate cancer Father   ? Thyroid disease Father   ? Skin cancer Sister   ? Diabetes Brother   ? Hypertension Brother   ? Kidney disease Neg Hx   ? Bladder Cancer Neg Hx   ? ? ?Social History  ? ?Socioeconomic History  ? Marital status: Married  ?  Spouse name: July   ? Number of children: 3  ? Years of education: Not on file  ? Highest education level: Bachelor's degree (e.g., BA, AB, BS)  ?Occupational History  ? Occupation: Art gallery manager   ?Tobacco Use  ? Smoking status: Never  ? Smokeless tobacco: Never  ?Vaping Use  ? Vaping Use: Never used  ?Substance and Sexual Activity  ? Alcohol use: No  ?  Comment: occasional  ? Drug use:  No  ? Sexual activity: Yes  ?  Partners: Female  ?Other Topics Concern  ? Not on  file  ?Social History Narrative  ? Married, has three children,   ? Twin daughters still in middle school  ? Son is going to school at [employer]   ? He also has a step son   ? ?Social Determinants of Health  ? ?Financial Resource Strain: Low Risk   ? Difficulty of Paying Living Expenses: Not hard at all  ?Food Insecurity: No Food Insecurity  ? Worried About Charity fundraiser in the Last Year: Never true  ? Ran Out of Food in the Last Year: Never true  ?Transportation Needs: No Transportation Needs  ? Lack of Transportation (Medical): No  ? Lack of Transportation (Non-Medical): No  ?Physical Activity: Sufficiently Active  ? Days of Exercise per Week: 5 days  ? Minutes of Exercise per Session: 50 min  ?Stress: No Stress Concern Present  ? Feeling of Stress : Not at all  ?Social Connections: Moderately Integrated  ? Frequency of Communication with Friends and Family: More than three times a week  ? Frequency of Social Gatherings with Friends and Family: More than three times a week  ? Attends Religious Services: Never  ? Active Member of Clubs or Organizations: Yes  ? Attends Archivist Meetings: More than 4 times per year  ? Marital Status: Married  ?Intimate Partner Violence: Not At Risk  ? Fear of Current or Ex-Partner: No  ? Emotionally Abused: No  ? Physically Abused: No  ? Sexually Abused: No  ? ? ? ?Current Outpatient Medications:  ?  amLODipine-benazepril (LOTREL) 5-20 MG capsule, Take 1 capsule by mouth every evening., Disp: 90 capsule, Rfl: 1 ?  Cholecalciferol (VITAMIN D3) 50 MCG (2000 UT) capsule, Take 1 capsule (2,000 Units total) by mouth daily., Disp: 100 capsule, Rfl: 1 ?  Cyanocobalamin (B-12) 1000 MCG SUBL, Place 1 tablet under the tongue 3 (three) times a week., Disp: 30 tablet, Rfl: 0 ?  diclofenac Sodium (VOLTAREN) 1 % GEL, Apply 2 g topically 4 (four) times daily., Disp: 100 g, Rfl: 0 ?  hydrochlorothiazide (HYDRODIURIL) 12.5 MG tablet, Take 1 tablet (12.5 mg total) by mouth every morning.,  Disp: 90 tablet, Rfl: 1 ?  Papav-Phentolamine-Alprostadil 150-5-50 MG-MG-MCG SOLR, 1 each by Intracavernosal route daily as needed., Disp: , Rfl:  ?  rosuvastatin (CRESTOR) 10 MG tablet, Take 1 tablet (

## 2021-06-14 ENCOUNTER — Other Ambulatory Visit: Payer: Self-pay | Admitting: Family Medicine

## 2021-06-14 DIAGNOSIS — M79645 Pain in left finger(s): Secondary | ICD-10-CM

## 2021-06-14 DIAGNOSIS — M79644 Pain in right finger(s): Secondary | ICD-10-CM

## 2021-11-14 NOTE — Progress Notes (Signed)
Name: Seth Meadows   MRN: [id number]    DOB: 01/20/1961   Date:11/15/2021       Progress Note  Subjective  Chief Complaint  Follow Up  HPI  OSA: he is compliant with CPAP machine , he sleeps 6-7 hours per night, he wakes up feeling rested , still falls asleep if sitting around at home   He has noticed pain on right index DIP over the 9 months  initially topical medication helped but no longer making a difference we will refer him to Ortho   Hypogonadism:  seeing Urologist Dr. Lonie Peak, using Trimix injections and taking testosterone 75 mg weekly, his HCT continues to be elevated, he is aware   Dyslipidemia: he is back on statin therapy no side effects, last LDL was better, down from 120 to 78, he has been on Rosuvastatin 10 mg and discussed going up to 20 mg, but he wants to recheck it first    HTN: he is on Lotrel 5/20 and HCTZ, occasionally has dizziness when stooping down and getting up quickly, denies chest pain or sob . BP at home is usually controlled   Dizziness : usually happens when going up the stairs, he can go to the gym and work out for over 30 minutes without problems. He never had a syncopal episode, usually lasts a few seconds and resolves with rest. He has seen cardiologist - he had stress test and echo done in 2015, he states started after he started taking bp medication, usually after he goes up the stairs and gets to the regular flat path when he gets dizzy. It is not associated with chest pain, diaphoresis or SOB. He is not going up stairs as often with new job. Mostly when stooping down and standing up quickly   Vitamin D deficiency and B12 deficiency: taking supplementation and last levels back to normal   Patient Active Problem List   Diagnosis Date Noted   Pinched nerve in shoulder, left 10/23/2019   OSA on CPAP 02/28/2016   Vitamin D insufficiency 08/31/2015   Dyslipidemia 03/01/2015   Erectile dysfunction of organic origin 10/29/2014   Arthritis of lumbar spine  10/22/2014   Intermittent low back pain 09/21/2014   Blood glucose elevated 08/17/2014   Hypogonadism in male 07/30/2014   Hypertension     Past Surgical History:  Procedure Laterality Date   EYELID LACERATION REPAIR  01/2017   cut the the eye lid to drain the infection out   WISDOM TOOTH EXTRACTION      Family History  Problem Relation Age of Onset   Hypertension Mother    Diabetes Mother    Hypertension Father    Prostate cancer Father    Thyroid disease Father    Skin cancer Sister    Diabetes Brother    Hypertension Brother    Kidney disease Neg Hx    Bladder Cancer Neg Hx     Social History   Tobacco Use   Smoking status: Never   Smokeless tobacco: Never  Substance Use Topics   Alcohol use: No    Comment: occasional     Current Outpatient Medications:    amLODipine-benazepril (LOTREL) 5-20 MG capsule, Take 1 capsule by mouth every evening., Disp: 90 capsule, Rfl: 1   Cholecalciferol (VITAMIN D3) 50 MCG (2000 UT) capsule, Take 1 capsule (2,000 Units total) by mouth daily., Disp: 100 capsule, Rfl: 1   Cyanocobalamin (B-12) 1000 MCG SUBL, Place 1 tablet under the tongue 3 (three) times a  week., Disp: 30 tablet, Rfl: 0   diclofenac Sodium (VOLTAREN) 1 % GEL, APPLY 2  GRAMS TOPICALLY 4 TIMES DAILY, Disp: 100 g, Rfl: 0   hydrochlorothiazide (HYDRODIURIL) 12.5 MG tablet, Take 1 tablet (12.5 mg total) by mouth every morning., Disp: 90 tablet, Rfl: 1   Papav-Phentolamine-Alprostadil 150-5-50 MG-MG-MCG SOLR, 1 each by Intracavernosal route daily as needed., Disp: , Rfl:    rosuvastatin (CRESTOR) 10 MG tablet, Take 1 tablet (10 mg total) by mouth daily., Disp: 90 tablet, Rfl: 1   SYRINGE-NEEDLE, DISP, 3 ML (MONOJECT SAFETY SYRINGE/SHIELD) 23G X 1" 3 ML MISC, Inject 66m intramuscular every 10days, Disp: , Rfl:    tadalafil (CIALIS) 20 MG tablet, Take 20 mg by mouth daily as needed., Disp: , Rfl:    testosterone cypionate (DEPOTESTOSTERONE CYPIONATE) 200 MG/ML injection,  Inject 75 mg into the muscle once a week., Disp: , Rfl:   Allergies  Allergen Reactions   Valsartan Other (See Comments)    diarrhea    I personally reviewed active problem list, medication list, allergies, family history, social history, health maintenance with the patient/caregiver today.   ROS  Constitutional: Negative for fever or weight change.  Respiratory: Negative for cough and shortness of breath.   Cardiovascular: Negative for chest pain or palpitations.  Gastrointestinal: Negative for abdominal pain, no bowel changes.  Musculoskeletal: Negative for gait problem or joint swelling.  Skin: Negative for rash.  Neurological: Negative for dizziness or headache.  No other specific complaints in a complete review of systems (except as listed in HPI above).   Objective  Vitals:   11/15/21 0851  BP: 132/78  Pulse: 91  Resp: 16  SpO2: 97%  Weight: 259 lb (117.5 kg)  Height: '6\' 1"'$  (1.854 m)    Body mass index is 34.17 kg/m.  Physical Exam  Constitutional: Patient appears well-developed and well-nourished. Obese  No distress.  HEENT: head atraumatic, normocephalic, pupils equal and reactive to light, neck supple, throat within normal limits Cardiovascular: Normal rate, regular rhythm and normal heart sounds.  No murmur heard. No BLE edema. Pulmonary/Chest: Effort normal and breath sounds normal. No respiratory distress. Abdominal: Soft.  There is no tenderness. Psychiatric: Patient has a normal mood and affect. behavior is normal. Judgment and thought content normal.    PHQ2/9:    11/15/2021    8:51 AM 06/02/2021   10:17 AM 05/16/2021    3:40 PM 03/29/2021   10:18 AM 03/25/2021   11:19 AM  Depression screen PHQ 2/9  Decreased Interest 0 0 0 0 0  Down, Depressed, Hopeless 0 0 0 0 0  PHQ - 2 Score 0 0 0 0 0  Altered sleeping 0 0 0 0 0  Tired, decreased energy 0 0 0 0 0  Change in appetite 0 0 0 0 0  Feeling bad or failure about yourself  0 0 0 0 0  Trouble  concentrating 0 0 0 0 0  Moving slowly or fidgety/restless 0 0 0 0 0  Suicidal thoughts 0 0 0 0 0  PHQ-9 Score 0 0 0 0 0  Difficult doing work/chores     Not difficult at all    phq 9 is negative   Fall Risk:    11/15/2021    8:51 AM 06/02/2021   10:17 AM 05/16/2021    3:40 PM 04/14/2021    3:39 PM 03/29/2021   10:18 AM  Fall Risk   Falls in the past year? 0 0 0 0 0  Number  falls in past yr: 0 0 0  0  Injury with Fall? 0 0 0  0  Risk for fall due to : No Fall Risks No Fall Risks No Fall Risks  No Fall Risks  Follow up Falls prevention discussed Falls prevention discussed Falls prevention discussed  Falls prevention discussed      Functional Status Survey: Is the patient deaf or have difficulty hearing?: No Does the patient have difficulty seeing, even when wearing glasses/contacts?: No Does the patient have difficulty concentrating, remembering, or making decisions?: No Does the patient have difficulty walking or climbing stairs?: No Does the patient have difficulty dressing or bathing?: No Does the patient have difficulty doing errands alone such as visiting a doctor's office or shopping?: No    Assessment & Plan  1. Essential hypertension  - amLODipine-benazepril (LOTREL) 5-20 MG capsule; Take 1 capsule by mouth every evening.  Dispense: 90 capsule; Refill: 1 - hydrochlorothiazide (HYDRODIURIL) 12.5 MG tablet; Take 1 tablet (12.5 mg total) by mouth every morning.  Dispense: 90 tablet; Refill: 1  2. Dyslipidemia  - rosuvastatin (CRESTOR) 10 MG tablet; Take 1 tablet (10 mg total) by mouth daily.  Dispense: 90 tablet; Refill: 1 - Lipid panel - COMPLETE METABOLIC PANEL WITH GFR  3. Vitamin B12 deficiency  Continue supplementation   4. OSA on CPAP  - CBC with Differential/Platelet  5. Vitamin D deficiency  Continue supplementation   6. Shift work sleep disorder   7. Elevated red blood cell count  - CBC with Differential/Platelet

## 2021-11-15 ENCOUNTER — Encounter: Payer: Self-pay | Admitting: Family Medicine

## 2021-11-15 ENCOUNTER — Ambulatory Visit: Payer: BC Managed Care – PPO | Admitting: Family Medicine

## 2021-11-15 VITALS — BP 126/74 | HR 91 | Resp 16 | Ht 73.0 in | Wt 259.0 lb

## 2021-11-15 DIAGNOSIS — I10 Essential (primary) hypertension: Secondary | ICD-10-CM

## 2021-11-15 DIAGNOSIS — E538 Deficiency of other specified B group vitamins: Secondary | ICD-10-CM

## 2021-11-15 DIAGNOSIS — E559 Vitamin D deficiency, unspecified: Secondary | ICD-10-CM

## 2021-11-15 DIAGNOSIS — E785 Hyperlipidemia, unspecified: Secondary | ICD-10-CM | POA: Diagnosis not present

## 2021-11-15 DIAGNOSIS — G4733 Obstructive sleep apnea (adult) (pediatric): Secondary | ICD-10-CM | POA: Diagnosis not present

## 2021-11-15 DIAGNOSIS — R718 Other abnormality of red blood cells: Secondary | ICD-10-CM

## 2021-11-15 MED ORDER — AMLODIPINE BESY-BENAZEPRIL HCL 5-20 MG PO CAPS: 1.0000 | ORAL_CAPSULE | Freq: Every evening | ORAL | 1 refills | Status: DC

## 2021-11-15 MED ORDER — ROSUVASTATIN CALCIUM 10 MG PO TABS: 10.0000 mg | ORAL_TABLET | Freq: Every day | ORAL | 1 refills | Status: DC

## 2021-11-15 MED ORDER — HYDROCHLOROTHIAZIDE 12.5 MG PO TABS: 12.5000 mg | ORAL_TABLET | ORAL | 1 refills | Status: DC

## 2021-11-16 LAB — COMPLETE METABOLIC PANEL WITH GFR
AG Ratio: 1.6 (calc) (ref 1.0–2.5)
ALT: 30 U/L (ref 9–46)
AST: 38 U/L — ABNORMAL HIGH (ref 10–35)
Albumin: 4.1 g/dL (ref 3.6–5.1)
Alkaline phosphatase (APISO): 51 U/L (ref 35–144)
BUN: 17 mg/dL (ref 7–25)
CO2: 29 mmol/L (ref 20–32)
Calcium: 9.1 mg/dL (ref 8.6–10.3)
Chloride: 103 mmol/L (ref 98–110)
Creat: 1.26 mg/dL (ref 0.70–1.35)
Globulin: 2.6 g/dL (calc) (ref 1.9–3.7)
Glucose, Bld: 86 mg/dL (ref 65–99)
Potassium: 4.2 mmol/L (ref 3.5–5.3)
Sodium: 138 mmol/L (ref 135–146)
Total Bilirubin: 0.8 mg/dL (ref 0.2–1.2)
Total Protein: 6.7 g/dL (ref 6.1–8.1)
eGFR: 64 mL/min/{1.73_m2} (ref >=60)

## 2021-11-16 LAB — LIPID PANEL
Cholesterol: 128 mg/dL (ref <200)
HDL: 37 mg/dL — ABNORMAL LOW (ref >=40)
LDL Cholesterol (Calc): 71 mg/dL (calc)
Non-HDL Cholesterol (Calc): 91 mg/dL (calc) (ref <130)
Total CHOL/HDL Ratio: 3.5 (calc) (ref <5.0)
Triglycerides: 117 mg/dL (ref <150)

## 2021-11-16 LAB — CBC WITH DIFFERENTIAL/PLATELET
Absolute Monocytes: 620 cells/uL (ref 200–950)
Basophils Absolute: 50 cells/uL (ref 0–200)
Basophils Relative: 0.8 %
Eosinophils Absolute: 167 cells/uL (ref 15–500)
Eosinophils Relative: 2.7 %
HCT: 53.5 % — ABNORMAL HIGH (ref 38.5–50.0)
Hemoglobin: 18.3 g/dL — ABNORMAL HIGH (ref 13.2–17.1)
Lymphs Abs: 1748 cells/uL (ref 850–3900)
MCH: 29.5 pg (ref 27.0–33.0)
MCHC: 34.2 g/dL (ref 32.0–36.0)
MCV: 86.2 fL (ref 80.0–100.0)
MPV: 10.6 fL (ref 7.5–12.5)
Monocytes Relative: 10 %
Neutro Abs: 3615 cells/uL (ref 1500–7800)
Neutrophils Relative %: 58.3 %
Platelets: 179 10*3/uL (ref 140–400)
RBC: 6.21 10*6/uL — ABNORMAL HIGH (ref 4.20–5.80)
RDW: 13.4 % (ref 11.0–15.0)
Total Lymphocyte: 28.2 %
WBC: 6.2 10*3/uL (ref 3.8–10.8)

## 2022-06-13 NOTE — Patient Instructions (Signed)

## 2022-06-13 NOTE — Progress Notes (Unsigned)
Name: Seth Meadows   MRN: [id number]    DOB: 1960-10-13   Date:06/14/2022       Progress Note  Subjective  Chief Complaint  Annual Exam  HPI  Patient presents for annual CPE.  IPSS Questionnaire (AUA-7): Over the past month.   1)  How often have you had a sensation of not emptying your bladder completely after you finish urinating?  0 - Not at all  2)  How often have you had to urinate again less than two hours after you finished urinating? 0 - Not at all  3)  How often have you found you stopped and started again several times when you urinated?  0 - Not at all  4) How difficult have you found it to postpone urination?  0 - Not at all  5) How often have you had a weak urinary stream?  0 - Not at all  6) How often have you had to push or strain to begin urination?  0 - Not at all  7) How many times did you most typically get up to urinate from the time you went to bed until the time you got up in the morning?  0 - None  Total score:  0-7 mildly symptomatic   8-19 moderately symptomatic   20-35 severely symptomatic     Diet: he tries to eat a healthy diet  Exercise: not going as often at this time  Last Dental Exam: up to date  Last Eye Exam: up to date   Depression: phq 9 is negative    06/14/2022    9:17 AM 11/15/2021    8:51 AM 06/02/2021   10:17 AM 05/16/2021    3:40 PM 03/29/2021   10:18 AM  Depression screen PHQ 2/9  Decreased Interest 0 0 0 0 0  Down, Depressed, Hopeless 0 0 0 0 0  PHQ - 2 Score 0 0 0 0 0  Altered sleeping 0 0 0 0 0  Tired, decreased energy 0 0 0 0 0  Change in appetite 0 0 0 0 0  Feeling bad or failure about yourself  0 0 0 0 0  Trouble concentrating 0 0 0 0 0  Moving slowly or fidgety/restless 0 0 0 0 0  Suicidal thoughts 0 0 0 0 0  PHQ-9 Score 0 0 0 0 0    Hypertension:  BP Readings from Last 3 Encounters:  06/14/22 (!) 148/98  11/15/21 126/74  06/02/21 132/70    Obesity: Wt Readings from Last 3 Encounters:  06/14/22 273 lb (123.8 kg)   11/15/21 259 lb (117.5 kg)  06/02/21 262 lb (118.8 kg)   BMI Readings from Last 3 Encounters:  06/14/22 36.02 kg/m  11/15/21 34.17 kg/m  06/02/21 34.57 kg/m     Lipids:  Lab Results  Component Value Date   CHOL 128 11/15/2021   CHOL 135 06/01/2021   CHOL 135 08/11/2020   Lab Results  Component Value Date   HDL 37 (L) 11/15/2021   HDL 41 06/01/2021   HDL 43 08/11/2020   Lab Results  Component Value Date   LDLCALC 71 11/15/2021   LDLCALC 78 06/01/2021   LDLCALC 77 08/11/2020   Lab Results  Component Value Date   TRIG 117 11/15/2021   TRIG 84 06/01/2021   TRIG 73 08/11/2020   Lab Results  Component Value Date   CHOLHDL 3.5 11/15/2021   CHOLHDL 3.3 06/01/2021   CHOLHDL 3.1 08/11/2020   No results found for: "  LDLDIRECT" Glucose:  Glucose, Bld  Date Value Ref Range Status  11/15/2021 86 65 - 99 mg/dL Final    Comment:    .            Fasting reference interval .   06/01/2021 90 65 - 99 mg/dL Final    Comment:    .            Fasting reference interval .   08/11/2020 87 65 - 99 mg/dL Final    Comment:    .            Fasting reference interval .     Flowsheet Row Office Visit from 06/02/2021 in [facility]  AUDIT-C Score 0       Married STD testing and prevention (HIV/chl/gon/syphilis): 11/19/18 Sexual history: one partner, seeing Urologist and taking medications  Hep C Screening: 07/12/16 Skin cancer: under the care of Dermatologist - [place]  Colorectal cancer: 02/25/12, we will place a referral today  Prostate cancer:  discussed USPTF, he states his father possibly but in his 64 's    Lung cancer:  Low Dose CT Chest recommended if Age 8-80 years, 30 pack-year currently smoking OR have quit w/in 15years. Patient  no a candidate for screening   AAA: The USPSTF recommends one-time screening with ultrasonography in men ages 36 to 75 years who have ever smoked. Patient   no, a candidate for screening  ECG:   12/12/19  Vaccines:   Tdap: up to date Shingrix: up to date Pneumonia: N/A Flu: up to date COVID-19: up to date  Advanced Care Planning: A voluntary discussion about advance care planning including the explanation and discussion of advance directives.  Discussed health care proxy and Living will, and the patient was able to identify a health care proxy as wife .  Patient does not have a living will and power of attorney of health care   Patient Active Problem List   Diagnosis Date Noted   Vitamin B12 deficiency 11/15/2021   Elevated red blood cell count 11/15/2021   Pinched nerve in shoulder, left 10/23/2019   OSA on CPAP 02/28/2016   Vitamin D deficiency 08/31/2015   Dyslipidemia 03/01/2015   Erectile dysfunction of organic origin 10/29/2014   Arthritis of lumbar spine 10/22/2014   Intermittent low back pain 09/21/2014   Blood glucose elevated 08/17/2014   Hypogonadism in male 07/30/2014   Essential hypertension     Past Surgical History:  Procedure Laterality Date   EYELID LACERATION REPAIR  01/2017   cut the the eye lid to drain the infection out   WISDOM TOOTH EXTRACTION      Family History  Problem Relation Age of Onset   Hypertension Mother    Diabetes Mother    Hypertension Father    Prostate cancer Father    Thyroid disease Father    Skin cancer Sister    Diabetes Brother    Hypertension Brother    Kidney disease Neg Hx    Bladder Cancer Neg Hx     Social History   Socioeconomic History   Marital status: Married    Spouse name: July    Number of children: 3   Years of education: Not on file   Highest education level: Bachelor's degree (e.g., BA, AB, BS)  Occupational History   Occupation: Acupuncturist   Tobacco Use   Smoking status: Never   Smokeless tobacco: Never  Vaping Use   Vaping Use: Never used  Substance and Sexual Activity   Alcohol use: No    Comment: occasional   Drug use: No   Sexual activity: Yes    Partners: Female   Other Topics Concern   Not on file  Social History Narrative   Married, has three children,    Twin daughters still in middle school   Son is going to school at [employer]    He also has a step son    Social Determinants of Health   Financial Resource Strain: Low Risk  (06/02/2021)   Overall Financial Resource Strain (CARDIA)    Difficulty of Paying Living Expenses: Not hard at all  Food Insecurity: No Food Insecurity (06/02/2021)   Hunger Vital Sign    Worried About Running Out of Food in the Last Year: Never true    Ran Out of Food in the Last Year: Never true  Transportation Needs: No Transportation Needs (06/02/2021)   PRAPARE - Administrator, Civil Service (Medical): No    Lack of Transportation (Non-Medical): No  Physical Activity: Sufficiently Active (06/02/2021)   Exercise Vital Sign    Days of Exercise per Week: 5 days    Minutes of Exercise per Session: 50 min  Stress: No Stress Concern Present (06/02/2021)   [employer] of Occupational Health - Occupational Stress Questionnaire    Feeling of Stress : Not at all  Social Connections: Moderately Integrated (06/02/2021)   Social Connection and Isolation Panel [NHANES]    Frequency of Communication with Friends and Family: More than three times a week    Frequency of Social Gatherings with Friends and Family: More than three times a week    Attends Religious Services: Never    Database administrator or Organizations: Yes    Attends Engineer, structural: More than 4 times per year    Marital Status: Married  Catering manager Violence: Not At Risk (06/02/2021)   Humiliation, Afraid, Rape, and Kick questionnaire    Fear of Current or Ex-Partner: No    Emotionally Abused: No    Physically Abused: No    Sexually Abused: No     Current Outpatient Medications:    amLODipine-benazepril (LOTREL) 5-20 MG capsule, Take 1 capsule by mouth every evening., Disp: 90 capsule, Rfl: 1   Cholecalciferol (VITAMIN D3)  50 MCG (2000 UT) capsule, Take 1 capsule (2,000 Units total) by mouth daily., Disp: 100 capsule, Rfl: 1   Cyanocobalamin (B-12) 1000 MCG SUBL, Place 1 tablet under the tongue 3 (three) times a week., Disp: 30 tablet, Rfl: 0   diclofenac Sodium (VOLTAREN) 1 % GEL, APPLY 2  GRAMS TOPICALLY 4 TIMES DAILY, Disp: 100 g, Rfl: 0   fluorouracil (EFUDEX) 5 % cream, Apply topically., Disp: , Rfl:    halobetasol (ULTRAVATE) 0.05 % cream, Apply topically., Disp: , Rfl:    hydrochlorothiazide (HYDRODIURIL) 12.5 MG tablet, Take 1 tablet (12.5 mg total) by mouth every morning., Disp: 90 tablet, Rfl: 1   Papav-Phentolamine-Alprostadil 150-5-50 MG-MG-MCG SOLR, 1 each by Intracavernosal route daily as needed., Disp: , Rfl:    rosuvastatin (CRESTOR) 10 MG tablet, Take 1 tablet (10 mg total) by mouth daily., Disp: 90 tablet, Rfl: 1   SYRINGE-NEEDLE, DISP, 3 ML (MONOJECT SAFETY SYRINGE/SHIELD) 23G X 1" 3 ML MISC, Inject 1mL intramuscular every 10days, Disp: , Rfl:    tadalafil (CIALIS) 20 MG tablet, Take 20 mg by mouth daily as needed., Disp: , Rfl:    testosterone cypionate (DEPOTESTOSTERONE CYPIONATE) 200 MG/ML injection,  Inject 75 mg into the muscle once a week., Disp: , Rfl:    triamcinolone ointment (KENALOG) 0.1 %, Apply topically., Disp: , Rfl:   Allergies  Allergen Reactions   Valsartan Other (See Comments)    diarrhea     ROS  Constitutional: Negative for fever , positive for weight change.  Respiratory: Negative for cough and shortness of breath.   Cardiovascular: Negative for chest pain or palpitations.  Gastrointestinal: Negative for abdominal pain, no bowel changes.  Musculoskeletal: Negative for gait problem or joint swelling.  Skin: Negative for rash.  Neurological: Negative for dizziness or headache.  No other specific complaints in a complete review of systems (except as listed in HPI above).    Objective  Vitals:   06/14/22 0915 06/14/22 0925  BP: (!) 150/100 (!) 148/98  Pulse: 76    Resp: 18   Temp: 97.7 F (36.5 C)   TempSrc: Oral   SpO2: 94%   Weight: 273 lb (123.8 kg)   Height: 6\' 1"  (1.854 m)     Body mass index is 36.02 kg/m.  Physical Exam  Constitutional: Patient appears well-developed and well-nourished Obesity . No distress.  HENT: Head: Normocephalic and atraumatic. Ears: B TMs ok, no erythema or effusion; Nose: Nose normal. Mouth/Throat: Oropharynx is clear and moist. No oropharyngeal exudate.  Eyes: Conjunctivae and EOM are normal. Pupils are equal, round, and reactive to light. No scleral icterus.  Neck: Normal range of motion. Neck supple. No JVD present. No thyromegaly present.  Cardiovascular: Normal rate, regular rhythm and normal heart sounds.  No murmur heard. No BLE edema. Pulmonary/Chest: Effort normal and breath sounds normal. No respiratory distress. Abdominal: Soft. Bowel sounds are normal, no distension. There is no tenderness. no masses MALE GENITALIA: Normal descended testes bilaterally, no masses palpated, no hernias, no lesions, no discharge RECTAL: Prostate normal size and consistency, no rectal masses or hemorrhoids Musculoskeletal: Normal range of motion, no joint effusions. No gross deformities Neurological: he is alert and oriented to person, place, and time. No cranial nerve deficit. Coordination, balance, strength, speech and gait are normal.  Skin: Skin is warm and dry. No rash noted. No erythema.  Psychiatric: Patient has a normal mood and affect. behavior is normal. Judgment and thought content normal.    Fall Risk:    06/14/2022    9:17 AM 11/15/2021    8:51 AM 06/02/2021   10:17 AM 05/16/2021    3:40 PM 04/14/2021    3:39 PM  Fall Risk   Falls in the past year? 1 0 0 0 0  Number falls in past yr: 0 0 0 0   Injury with Fall? 0 0 0 0   Risk for fall due to : History of fall(s) No Fall Risks No Fall Risks No Fall Risks   Follow up Falls prevention discussed;Education provided;Falls evaluation completed Falls  prevention discussed Falls prevention discussed Falls prevention discussed      Functional Status Survey: Is the patient deaf or have difficulty hearing?: No Does the patient have difficulty seeing, even when wearing glasses/contacts?: No Does the patient have difficulty concentrating, remembering, or making decisions?: No Does the patient have difficulty walking or climbing stairs?: No Does the patient have difficulty dressing or bathing?: No Does the patient have difficulty doing errands alone such as visiting a doctor's office or shopping?: No    Assessment & Plan  1. Well adult exam  - Ambulatory referral to Gastroenterology - Lipid panel - VITAMIN D 25 Hydroxy (Vit-D  Deficiency, Fractures) - B12 and Folate Panel - CBC with Differential/Platelet - COMPLETE METABOLIC PANEL WITH GFR - Hemoglobin A1c - Estradiol - Testosterone,Free and Total - Uric acid - PSA  2. Colon cancer screening  - Ambulatory referral to Gastroenterology  3. Vitamin B12 deficiency  - B12 and Folate Panel  4. Vitamin D deficiency  - VITAMIN D 25 Hydroxy (Vit-D Deficiency, Fractures)  5. Essential hypertension  - CBC with Differential/Platelet - COMPLETE METABOLIC PANEL WITH GFR  6. Diabetes mellitus screening  A1C  7. Weight gain  - TSH  8. Prostate cancer screening  - PSA  9. Elevated uric acid in blood  - Uric acid  10. Hypogonadism in male  - Estradiol - Testosterone,Free and Total  11. Dyslipidemia  - Lipid panel   12. Precordial pain  - CT CARDIAC SCORING; Future - Ambulatory referral to Cardiology     -Prostate cancer screening and PSA options (with potential risks and benefits of testing vs not testing) were discussed along with recent recs/guidelines. -USPSTF grade A and B recommendations reviewed with patient; age-appropriate recommendations, preventive care, screening tests, etc discussed and encouraged; healthy living encouraged; see AVS for patient  education given to patient -Discussed importance of 150 minutes of physical activity weekly, eat two servings of fish weekly, eat one serving of tree nuts ( cashews, pistachios, pecans, almonds.Marland Kitchen) every other day, eat 6 servings of fruit/vegetables daily and drink plenty of water and avoid sweet beverages.  -Reviewed Health Maintenance: yes

## 2022-06-14 ENCOUNTER — Encounter: Payer: Self-pay | Admitting: Family Medicine

## 2022-06-14 ENCOUNTER — Ambulatory Visit (INDEPENDENT_AMBULATORY_CARE_PROVIDER_SITE_OTHER): Payer: BC Managed Care – PPO | Admitting: Family Medicine

## 2022-06-14 VITALS — BP 148/98 | HR 76 | Temp 97.7°F | Resp 18 | Ht 73.0 in | Wt 273.0 lb

## 2022-06-14 DIAGNOSIS — Z0000 Encounter for general adult medical examination without abnormal findings: Secondary | ICD-10-CM

## 2022-06-14 DIAGNOSIS — I10 Essential (primary) hypertension: Secondary | ICD-10-CM

## 2022-06-14 DIAGNOSIS — E291 Testicular hypofunction: Secondary | ICD-10-CM

## 2022-06-14 DIAGNOSIS — E785 Hyperlipidemia, unspecified: Secondary | ICD-10-CM

## 2022-06-14 DIAGNOSIS — E559 Vitamin D deficiency, unspecified: Secondary | ICD-10-CM

## 2022-06-14 DIAGNOSIS — Z125 Encounter for screening for malignant neoplasm of prostate: Secondary | ICD-10-CM

## 2022-06-14 DIAGNOSIS — E538 Deficiency of other specified B group vitamins: Secondary | ICD-10-CM

## 2022-06-14 DIAGNOSIS — R635 Abnormal weight gain: Secondary | ICD-10-CM

## 2022-06-14 DIAGNOSIS — R072 Precordial pain: Secondary | ICD-10-CM

## 2022-06-14 DIAGNOSIS — Z131 Encounter for screening for diabetes mellitus: Secondary | ICD-10-CM

## 2022-06-14 DIAGNOSIS — Z1211 Encounter for screening for malignant neoplasm of colon: Secondary | ICD-10-CM

## 2022-06-14 DIAGNOSIS — E790 Hyperuricemia without signs of inflammatory arthritis and tophaceous disease: Secondary | ICD-10-CM

## 2022-06-14 LAB — CBC WITH DIFFERENTIAL/PLATELET
Basophils Relative: 0.9 %
Eosinophils Absolute: 179 cells/uL (ref 15–500)
HCT: 49 % (ref 38.5–50.0)
MCHC: 33.7 g/dL (ref 32.0–36.0)
MPV: 10.7 fL (ref 7.5–12.5)
Monocytes Relative: 9.1 %
Neutrophils Relative %: 56.4 %

## 2022-06-15 ENCOUNTER — Other Ambulatory Visit: Payer: Self-pay

## 2022-06-15 ENCOUNTER — Telehealth: Payer: Self-pay

## 2022-06-15 DIAGNOSIS — Z1211 Encounter for screening for malignant neoplasm of colon: Secondary | ICD-10-CM

## 2022-06-15 LAB — CBC WITH DIFFERENTIAL/PLATELET
Absolute Monocytes: 510 cells/uL (ref 200–950)
Basophils Absolute: 50 cells/uL (ref 0–200)
Eosinophils Relative: 3.2 %
Hemoglobin: 16.5 g/dL (ref 13.2–17.1)
Lymphs Abs: 1702 cells/uL (ref 850–3900)
MCH: 29.9 pg (ref 27.0–33.0)
MCV: 88.9 fL (ref 80.0–100.0)
Neutro Abs: 3158 cells/uL (ref 1500–7800)
Platelets: 204 10*3/uL (ref 140–400)
RBC: 5.51 10*6/uL (ref 4.20–5.80)
RDW: 13.9 % (ref 11.0–15.0)
Total Lymphocyte: 30.4 %
WBC: 5.6 10*3/uL (ref 3.8–10.8)

## 2022-06-15 LAB — COMPLETE METABOLIC PANEL WITH GFR
AG Ratio: 1.5 (calc) (ref 1.0–2.5)
ALT: 29 U/L (ref 9–46)
AST: 31 U/L (ref 10–35)
Albumin: 4 g/dL (ref 3.6–5.1)
Alkaline phosphatase (APISO): 56 U/L (ref 35–144)
BUN: 19 mg/dL (ref 7–25)
CO2: 24 mmol/L (ref 20–32)
Calcium: 9 mg/dL (ref 8.6–10.3)
Chloride: 104 mmol/L (ref 98–110)
Creat: 1.14 mg/dL (ref 0.70–1.35)
Globulin: 2.7 g/dL (calc) (ref 1.9–3.7)
Glucose, Bld: 91 mg/dL (ref 65–99)
Potassium: 4 mmol/L (ref 3.5–5.3)
Sodium: 140 mmol/L (ref 135–146)
Total Bilirubin: 0.6 mg/dL (ref 0.2–1.2)
Total Protein: 6.7 g/dL (ref 6.1–8.1)
eGFR: 72 mL/min/{1.73_m2} (ref >=60)

## 2022-06-15 LAB — LIPID PANEL
Cholesterol: 144 mg/dL (ref <200)
HDL: 41 mg/dL (ref >=40)
LDL Cholesterol (Calc): 82 mg/dL (calc)
Non-HDL Cholesterol (Calc): 103 mg/dL (calc) (ref <130)
Total CHOL/HDL Ratio: 3.5 (calc) (ref <5.0)
Triglycerides: 117 mg/dL (ref <150)

## 2022-06-15 LAB — VITAMIN D 25 HYDROXY (VIT D DEFICIENCY, FRACTURES): Vit D, 25-Hydroxy: 49 ng/mL (ref 30–100)

## 2022-06-15 LAB — ESTRADIOL: Estradiol: 64 pg/mL — ABNORMAL HIGH (ref <=39)

## 2022-06-15 LAB — URIC ACID: Uric Acid, Serum: 6.1 mg/dL (ref 4.0–8.0)

## 2022-06-15 LAB — PSA: PSA: 1.15 ng/mL (ref <=4.00)

## 2022-06-15 LAB — HEMOGLOBIN A1C
Hgb A1c MFr Bld: 5.1 % of total Hgb (ref <5.7)
Mean Plasma Glucose: 100 mg/dL
eAG (mmol/L): 5.5 mmol/L

## 2022-06-15 LAB — TESTOSTERONE: Testosterone: 739 ng/dL (ref 250–827)

## 2022-06-15 LAB — TSH: TSH: 2.48 mIU/L (ref 0.40–4.50)

## 2022-06-15 LAB — B12 AND FOLATE PANEL
Folate: >24 ng/mL
Vitamin B-12: 824 pg/mL (ref 200–1100)

## 2022-06-15 MED ORDER — NA SULFATE-K SULFATE-MG SULF 17.5-3.13-1.6 GM/177ML PO SOLN: 1.0000 | Freq: Once | ORAL | 0 refills | Status: AC

## 2022-06-15 NOTE — Telephone Encounter (Signed)
Gastroenterology Pre-Procedure Review  Request Date: 07/04/22 Requesting Physician: Dr. Tobi Bastos  PATIENT REVIEW QUESTIONS: The patient responded to the following health history questions as indicated:    1. Are you having any GI issues? no 2. Do you have a personal history of Polyps? no 3. Do you have a family history of Colon Cancer or Polyps? no 4. Diabetes Mellitus? no 5. Joint replacements in the past 12 months?no 6. Major health problems in the past 3 months?no 7. Any artificial heart valves, MVP, or defibrillator?no    MEDICATIONS & ALLERGIES:    Patient reports the following regarding taking any anticoagulation/antiplatelet therapy:   Plavix, Coumadin, Eliquis, Xarelto, Lovenox, Pradaxa, Brilinta, or Effient? no Aspirin? no  Patient confirms/reports the following medications:  Current Outpatient Medications  Medication Sig Dispense Refill   amLODipine-benazepril (LOTREL) 5-20 MG capsule Take 1 capsule by mouth every evening. 90 capsule 1   Cholecalciferol (VITAMIN D3) 50 MCG (2000 UT) capsule Take 1 capsule (2,000 Units total) by mouth daily. 100 capsule 1   Cyanocobalamin (B-12) 1000 MCG SUBL Place 1 tablet under the tongue 3 (three) times a week. 30 tablet 0   diclofenac Sodium (VOLTAREN) 1 % GEL APPLY 2  GRAMS TOPICALLY 4 TIMES DAILY 100 g 0   fluorouracil (EFUDEX) 5 % cream Apply topically.     halobetasol (ULTRAVATE) 0.05 % cream Apply topically.     hydrochlorothiazide (HYDRODIURIL) 12.5 MG tablet Take 1 tablet (12.5 mg total) by mouth every morning. 90 tablet 1   Papav-Phentolamine-Alprostadil 150-5-50 MG-MG-MCG SOLR 1 each by Intracavernosal route daily as needed.     rosuvastatin (CRESTOR) 10 MG tablet Take 1 tablet (10 mg total) by mouth daily. 90 tablet 1   SYRINGE-NEEDLE, DISP, 3 ML (MONOJECT SAFETY SYRINGE/SHIELD) 23G X 1" 3 ML MISC Inject 1mL intramuscular every 10days     tadalafil (CIALIS) 20 MG tablet Take 20 mg by mouth daily as needed.     testosterone  cypionate (DEPOTESTOSTERONE CYPIONATE) 200 MG/ML injection Inject 75 mg into the muscle once a week.     triamcinolone ointment (KENALOG) 0.1 % Apply topically.     No current facility-administered medications for this visit.    Patient confirms/reports the following allergies:  Allergies  Allergen Reactions   Valsartan Other (See Comments)    diarrhea    No orders of the defined types were placed in this encounter.   AUTHORIZATION INFORMATION Primary Insurance: 1D#: Group #:  Secondary Insurance: 1D#: Group #:  SCHEDULE INFORMATION: Date: 07/04/22 Time: Location: [facility]

## 2022-07-02 ENCOUNTER — Other Ambulatory Visit: Payer: Self-pay | Admitting: Family Medicine

## 2022-07-02 DIAGNOSIS — E785 Hyperlipidemia, unspecified: Secondary | ICD-10-CM

## 2022-07-02 DIAGNOSIS — I10 Essential (primary) hypertension: Secondary | ICD-10-CM

## 2022-07-04 ENCOUNTER — Ambulatory Visit
Admission: RE | Admit: 2022-07-04 | Discharge: 2022-07-04 | Disposition: A | Discharge status: Home / Self Care | Payer: BC Managed Care – PPO | Attending: Gastroenterology | Admitting: Gastroenterology

## 2022-07-04 ENCOUNTER — Ambulatory Visit: Payer: BC Managed Care – PPO | Admitting: Registered Nurse

## 2022-07-04 ENCOUNTER — Encounter
Admission: RE | Disposition: A | Discharge status: Home / Self Care | Payer: Self-pay | Source: Home / Self Care | Attending: Gastroenterology

## 2022-07-04 ENCOUNTER — Encounter: Payer: Self-pay | Admitting: Gastroenterology

## 2022-07-04 DIAGNOSIS — Z1211 Encounter for screening for malignant neoplasm of colon: Secondary | ICD-10-CM | POA: Insufficient documentation

## 2022-07-04 DIAGNOSIS — D126 Benign neoplasm of colon, unspecified: Secondary | ICD-10-CM

## 2022-07-04 DIAGNOSIS — D124 Benign neoplasm of descending colon: Secondary | ICD-10-CM | POA: Insufficient documentation

## 2022-07-04 DIAGNOSIS — K5730 Diverticulosis of large intestine without perforation or abscess without bleeding: Secondary | ICD-10-CM | POA: Diagnosis not present

## 2022-07-04 HISTORY — PX: COLONOSCOPY WITH PROPOFOL: SHX5780

## 2022-07-04 SURGERY — COLONOSCOPY WITH PROPOFOL
Anesthesia: General

## 2022-07-04 MED ORDER — SODIUM CHLORIDE 0.9 % IV SOLN
INTRAVENOUS | Status: DC
  Administered 2022-07-04: 1000 mL via INTRAVENOUS

## 2022-07-04 MED ORDER — PROPOFOL 10 MG/ML IV BOLUS
INTRAVENOUS | Status: AC
  Filled 2022-07-04: qty 40

## 2022-07-04 MED ORDER — LIDOCAINE HCL (CARDIAC) PF 100 MG/5ML IV SOSY
PREFILLED_SYRINGE | INTRAVENOUS | Status: DC | PRN
  Administered 2022-07-04: 50 mg via INTRAVENOUS

## 2022-07-04 MED ORDER — PROPOFOL 500 MG/50ML IV EMUL
INTRAVENOUS | Status: DC | PRN
  Administered 2022-07-04: 100 ug/kg/min via INTRAVENOUS

## 2022-07-04 MED ORDER — PROPOFOL 10 MG/ML IV BOLUS
INTRAVENOUS | Status: DC | PRN
  Administered 2022-07-04: 50 mg via INTRAVENOUS

## 2022-07-04 NOTE — H&P (Signed)
Seth Mood, MD [address], [address], [place], [place], [address] [address], [address], [place], [place], [address] Phone: [phone]  Fax: [phone]  Primary Care Physician:  Alba Cory, MD   Pre-Procedure History & Physical: HPI:  Seth Meadows is a 62 y.o. male is here for an colonoscopy.   Past Medical History:  Diagnosis Date   Allergic rhinitis    BPH with obstruction/lower urinary tract symptoms    Cutaneous eruption    Earache    Epididymitis, left    Hyperglycemia    Hypertension    Hypogonadism in male    Kidney function test abnormal    Overweight    Scrotal pain    Sleep apnea     Past Surgical History:  Procedure Laterality Date   EYELID LACERATION REPAIR  01/2017   cut the the eye lid to drain the infection out   WISDOM TOOTH EXTRACTION      Prior to Admission medications   Medication Sig Start Date End Date Taking? Authorizing Provider  amLODipine-benazepril (LOTREL) 5-20 MG capsule Take 1 capsule by mouth every evening. 11/15/21   Alba Cory, MD  Cholecalciferol (VITAMIN D3) 50 MCG (2000 UT) capsule Take 1 capsule (2,000 Units total) by mouth daily. 11/11/20   Alba Cory, MD  Cyanocobalamin (B-12) 1000 MCG SUBL Place 1 tablet under the tongue 3 (three) times a week. 11/12/20   Alba Cory, MD  diclofenac Sodium (VOLTAREN) 1 % GEL APPLY 2  GRAMS TOPICALLY 4 TIMES DAILY 06/14/21   Alba Cory, MD  fluorouracil (EFUDEX) 5 % cream Apply topically. 04/08/22   [provider]  halobetasol (ULTRAVATE) 0.05 % cream Apply topically. 02/25/22   [provider]  hydrochlorothiazide (HYDRODIURIL) 12.5 MG tablet TAKE 1 TABLET BY MOUTH IN THE MORNING 07/02/22   Alba Cory, MD  Papav-Phentolamine-Alprostadil 150-5-50 MG-MG-MCG SOLR 1 each by Intracavernosal route daily as needed.    [facility]  rosuvastatin (CRESTOR) 10 MG tablet Take 1 tablet by mouth once daily 07/02/22   Sowles, Danna Hefty, MD   SYRINGE-NEEDLE, DISP, 3 ML (MONOJECT SAFETY SYRINGE/SHIELD) 23G X 1" 3 ML MISC Inject 1mL intramuscular every 10days 06/12/18   [provider]  tadalafil (CIALIS) 20 MG tablet Take 20 mg by mouth daily as needed.    [facility]  testosterone cypionate (DEPOTESTOSTERONE CYPIONATE) 200 MG/ML injection Inject 75 mg into the muscle once a week. 04/12/21   [facility]  triamcinolone ointment (KENALOG) 0.1 % Apply topically. 04/19/22   [provider]    Allergies as of 06/15/2022 - Review Complete 06/15/2022  Allergen Reaction Noted   Valsartan Other (See Comments) 02/04/2020    Family History  Problem Relation Age of Onset   Hypertension Mother    Diabetes Mother    Hypertension Father    Prostate cancer Father    Thyroid disease Father    Skin cancer Sister    Diabetes Brother    Hypertension Brother    Kidney disease Neg Hx    Bladder Cancer Neg Hx     Social History   Socioeconomic History   Marital status: Married    Spouse name: July    Number of children: 3   Years of education: Not on file   Highest education level: Bachelor's degree (e.g., BA, AB, BS)  Occupational History   Occupation: Acupuncturist   Tobacco Use   Smoking status: Never   Smokeless tobacco: Never  Vaping  Use   Vaping Use: Never used  Substance and Sexual Activity   Alcohol use: No    Comment: occasional   Drug use: No   Sexual activity: Yes    Partners: Female  Other Topics Concern   Not on file  Social History Narrative   Married, has three children,    Twin daughters still in middle school   Son is going to school at [employer]    He also has a step son    Social Determinants of Health   Financial Resource Strain: Low Risk  (06/14/2022)   Overall Financial Resource Strain (CARDIA)    Difficulty of Paying Living Expenses: Not hard at all  Food Insecurity: No Food Insecurity (06/14/2022)   Hunger Vital Sign    Worried About  Running Out of Food in the Last Year: Never true    Ran Out of Food in the Last Year: Never true  Transportation Needs: No Transportation Needs (06/14/2022)   PRAPARE - Administrator, Civil Service (Medical): No    Lack of Transportation (Non-Medical): No  Physical Activity: Sufficiently Active (06/14/2022)   Exercise Vital Sign    Days of Exercise per Week: 5 days    Minutes of Exercise per Session: 40 min  Stress: No Stress Concern Present (06/14/2022)   [employer] of Occupational Health - Occupational Stress Questionnaire    Feeling of Stress : Not at all  Social Connections: Moderately Integrated (06/14/2022)   Social Connection and Isolation Panel [NHANES]    Frequency of Communication with Friends and Family: More than three times a week    Frequency of Social Gatherings with Friends and Family: More than three times a week    Attends Religious Services: Never    Database administrator or Organizations: Yes    Attends Engineer, structural: More than 4 times per year    Marital Status: Married  Catering manager Violence: Not At Risk (06/14/2022)   Humiliation, Afraid, Rape, and Kick questionnaire    Fear of Current or Ex-Partner: No    Emotionally Abused: No    Physically Abused: No    Sexually Abused: No    Review of Systems: See HPI, otherwise negative ROS  Physical Exam: There were no vitals taken for this visit. General:   Alert,  pleasant and cooperative in NAD Head:  Normocephalic and atraumatic. Neck:  Supple; no masses or thyromegaly. Lungs:  Clear throughout to auscultation, normal respiratory effort.    Heart:  +S1, +S2, Regular rate and rhythm, No edema. Abdomen:  Soft, nontender and nondistended. Normal bowel sounds, without guarding, and without rebound.   Neurologic:  Alert and  oriented x4;  grossly normal neurologically.  Impression/Plan: Seth Meadows is here for an colonoscopy to be performed for Screening colonoscopy average  risk   Risks, benefits, limitations, and alternatives regarding  colonoscopy have been reviewed with the patient.  Questions have been answered.  All parties agreeable.   Seth Mood, MD  07/04/2022, 9:45 AM \

## 2022-07-04 NOTE — Transfer of Care (Signed)
Immediate Anesthesia Transfer of Care Note  Patient: Camron Monday  Procedure(s) Performed: COLONOSCOPY WITH PROPOFOL  Patient Location: PACU  Anesthesia Type:General  Level of Consciousness: drowsy and patient cooperative  Airway & Oxygen Therapy: Patient Spontanous Breathing  Post-op Assessment: Report given to RN and Post -op Vital signs reviewed and stable  Post vital signs: stable  Last Vitals:  Vitals Value Taken Time  BP 147/90 07/04/22 1055  Temp 35.7 C 07/04/22 1054  Pulse 78 07/04/22 1055  Resp 21 07/04/22 1055  SpO2 97 % 07/04/22 1055  Vitals shown include unvalidated device data.  Last Pain:  Vitals:   07/04/22 1054  TempSrc: Temporal  PainSc: Asleep         Complications: No notable events documented.

## 2022-07-04 NOTE — Anesthesia Preprocedure Evaluation (Signed)
Anesthesia Evaluation  Patient identified by MRN, date of birth, ID band Patient awake    Reviewed: Allergy & Precautions, NPO status , Patient's Chart, lab work & pertinent test results  Airway Mallampati: III  TM Distance: >3 FB Neck ROM: full    Dental  (+) Chipped, Dental Advidsory Given   Pulmonary sleep apnea    Pulmonary exam normal        Cardiovascular hypertension, negative cardio ROS Normal cardiovascular exam     Neuro/Psych negative neurological ROS  negative psych ROS   GI/Hepatic negative GI ROS, Neg liver ROS,,,  Endo/Other  negative endocrine ROS    Renal/GU negative Renal ROS  negative genitourinary   Musculoskeletal   Abdominal   Peds  Hematology negative hematology ROS (+)   Anesthesia Other Findings Past Medical History: No date: Allergic rhinitis No date: BPH with obstruction/lower urinary tract symptoms No date: Cutaneous eruption No date: Earache No date: Epididymitis, left No date: Hyperglycemia No date: Hypertension No date: Hypogonadism in male No date: Kidney function test abnormal No date: Overweight No date: Scrotal pain No date: Sleep apnea  Past Surgical History: 01/2017: EYELID LACERATION REPAIR     Comment:  cut the the eye lid to drain the infection out No date: WISDOM TOOTH EXTRACTION  BMI    Body Mass Index: 34.04 kg/m      Reproductive/Obstetrics negative OB ROS                             Anesthesia Physical Anesthesia Plan  ASA: 2  Anesthesia Plan: General   Post-op Pain Management:    Induction: Intravenous  PONV Risk Score and Plan: 3 and Ondansetron, Propofol infusion and TIVA  Airway Management Planned: Natural Airway and Nasal Cannula  Additional Equipment:   Intra-op Plan:   Post-operative Plan:   Informed Consent: I have reviewed the patients History and Physical, chart, labs and discussed the procedure  including the risks, benefits and alternatives for the proposed anesthesia with the patient or authorized representative who has indicated his/her understanding and acceptance.     Dental Advisory Given  Plan Discussed with: Anesthesiologist, CRNA and Surgeon  Anesthesia Plan Comments: (Patient consented for risks of anesthesia including but not limited to:  - adverse reactions to medications - risk of airway placement if required - damage to eyes, teeth, lips or other oral mucosa - nerve damage due to positioning  - sore throat or hoarseness - Damage to heart, brain, nerves, lungs, other parts of body or loss of life  Patient voiced understanding.)        Anesthesia Quick Evaluation

## 2022-07-04 NOTE — Op Note (Signed)
[facility] Patient Name: Seth Meadows Procedure Date: 07/04/2022 10:17 AM MRN: [id number] Account #: [id number] Date of Birth: 01/19/1961 Admit Type: Outpatient Age: 62 Room: [facility] ENDO ROOM 2 Gender: Male Note Status: Finalized Instrument Name: Nelda Marseille [id number] Procedure:             Colonoscopy Indications:           Screening for colorectal malignant neoplasm Providers:             Wyline Mood MD, MD Referring MD:          Onnie Boer. Sowles, MD (Referring MD) Medicines:             Monitored Anesthesia Care Complications:         No immediate complications. Procedure:             Pre-Anesthesia Assessment:                        - Prior to the procedure, a History and Physical was                         performed, and patient medications, allergies and                         sensitivities were reviewed. The patient's tolerance                         of previous anesthesia was reviewed.                        - The risks and benefits of the procedure and the                         sedation options and risks were discussed with the                         patient. All questions were answered and informed                         consent was obtained.                        - ASA Grade Assessment: II - A patient with mild                         systemic disease.                        After obtaining informed consent, the colonoscope was                         passed under direct vision. Throughout the procedure,                         the patient's blood pressure, pulse, and oxygen                         saturations were monitored continuously. The                         Colonoscope was  introduced through the anus and                         advanced to the the cecum, identified by the                         appendiceal orifice. The colonoscopy was performed                         with ease. The patient tolerated the procedure well.                          The quality of the bowel preparation was excellent.                         The ileocecal valve, appendiceal orifice, and rectum                         were photographed. Findings:      The perianal and digital rectal examinations were normal.      A 4 mm polyp was found in the transverse colon. The polyp was sessile.       The polyp was removed with a cold snare. Resection and retrieval were       complete.      A 3 mm polyp was found in the descending colon. The polyp was sessile.       The polyp was removed with a cold biopsy forceps. Resection and       retrieval were complete.      Multiple small-mouthed diverticula were found in the sigmoid colon.      The exam was otherwise without abnormality on direct and retroflexion       views. Impression:            - One 4 mm polyp in the transverse colon, removed with                         a cold snare. Resected and retrieved.                        - One 3 mm polyp in the descending colon, removed with                         a cold biopsy forceps. Resected and retrieved.                        - Diverticulosis in the sigmoid colon.                        - The examination was otherwise normal on direct and                         retroflexion views. Recommendation:        - Discharge patient to home (with escort).                        - Resume previous diet.                        - Continue  present medications.                        - Await pathology results.                        - Repeat colonoscopy for surveillance based on                         pathology results. Procedure Code(s):     --- Professional ---                        [phone], Colonoscopy, flexible; with removal of                         tumor(s), polyp(s), or other lesion(s) by snare                         technique                        45380, 59, Colonoscopy, flexible; with biopsy, single                         or multiple Diagnosis  Code(s):     --- Professional ---                        Z12.11, Encounter for screening for malignant neoplasm                         of colon                        D12.3, Benign neoplasm of transverse colon (hepatic                         flexure or splenic flexure)                        D12.4, Benign neoplasm of descending colon                        K57.30, Diverticulosis of large intestine without                         perforation or abscess without bleeding CPT copyright 2022 American Medical Association. All rights reserved. The codes documented in this report are preliminary and upon coder review may  be revised to meet current compliance requirements. Wyline Mood, MD Wyline Mood MD, MD 07/04/2022 10:53:14 AM This report has been signed electronically. Number of Addenda: 0 Note Initiated On: 07/04/2022 10:17 AM Scope Withdrawal Time: 0 hours 14 minutes 56 seconds  Total Procedure Duration: 0 hours 20 minutes 11 seconds  Estimated Blood Loss:  Estimated blood loss: none.      [facility]

## 2022-07-05 ENCOUNTER — Encounter: Payer: Self-pay | Admitting: Gastroenterology

## 2022-07-05 NOTE — Anesthesia Postprocedure Evaluation (Signed)
Anesthesia Post Note  Patient: Seth Meadows  Procedure(s) Performed: COLONOSCOPY WITH PROPOFOL  Patient location during evaluation: Endoscopy Anesthesia Type: General Level of consciousness: awake and alert Pain management: pain level controlled Vital Signs Assessment: post-procedure vital signs reviewed and stable Respiratory status: spontaneous breathing, nonlabored ventilation, respiratory function stable and patient connected to nasal cannula oxygen Cardiovascular status: blood pressure returned to baseline and stable Postop Assessment: no apparent nausea or vomiting Anesthetic complications: no  No notable events documented.   Last Vitals:  Vitals:   07/04/22 1054 07/04/22 1104  BP:  (!) 166/96  Pulse:    Resp: (!) 21 19  Temp: (!) 35.7 C   SpO2:      Last Pain:  Vitals:   07/04/22 1114  TempSrc:   PainSc: 0-No pain                 Stephanie Coup

## 2022-07-06 ENCOUNTER — Ambulatory Visit: Payer: BC Managed Care – PPO | Attending: Cardiology | Admitting: Cardiology

## 2022-07-06 ENCOUNTER — Encounter: Payer: Self-pay | Admitting: Cardiology

## 2022-07-06 VITALS — BP 124/68 | HR 78 | Ht 73.0 in | Wt 259.6 lb

## 2022-07-06 DIAGNOSIS — I10 Essential (primary) hypertension: Secondary | ICD-10-CM | POA: Diagnosis not present

## 2022-07-06 NOTE — Patient Instructions (Signed)
Medication Instructions:   Your physician recommends that you continue on your current medications as directed. Please refer to the Current Medication list given to you today.  *If you need a refill on your cardiac medications before your next appointment, please call your pharmacy*   Lab Work:  None Ordered  If you have labs (blood work) drawn today and your tests are completely normal, you will receive your results only by: MyChart Message (if you have MyChart) OR A paper copy in the mail If you have any lab test that is abnormal or we need to change your treatment, we will call you to review the results.   Testing/Procedures:  None Ordered   Follow-Up: At [facility], you and your health needs are our priority.  As part of our continuing mission to provide you with exceptional heart care, we have created designated Provider Care Teams.  These Care Teams include your primary Cardiologist (physician) and Advanced Practice Providers (APPs -  Physician Assistants and Nurse Practitioners) who all work together to provide you with the care you need, when you need it.  We recommend signing up for the patient portal called "MyChart".  Sign up information is provided on this After Visit Summary.  MyChart is used to connect with patients for Virtual Visits (Telemedicine).  Patients are able to view lab/test results, encounter notes, upcoming appointments, etc.  Non-urgent messages can be sent to your provider as well.   To learn more about what you can do with MyChart, go to [email or web address].    Your next appointment:  AS NEEDED 

## 2022-07-06 NOTE — Progress Notes (Signed)
Cardiology Office Note:    Date:  07/06/2022   ID:  Seth Meadows, DOB [birth year], MRN [id number]  PCP:  Seth Cory, MD  Cardiologist:  None  Electrophysiologist:  None   Referring MD: Seth Cory, MD   Chief Complaint  Patient presents with   Follow-up    Patient last seen in 12/2019.  Returning today for as advised by PCP for blood pressure control.      History of Present Illness:    Seth Meadows is a 62 y.o. male with a hx of hypertension, hypogonadism on testosterone, OSA on CPAP who presents for follow-up.    Blood pressures have been adequately controlled on current medications.  He sometimes gets dizzy with associated low blood pressures systolics in the low 100s/when he goes off testosterone.  Current blood pressures run in the 120s systolic with him taking his testosterone over the past month or so.  Denies palpitations, denies shortness of breath, denies chest pain.  Prior notes Valsartan caused loose stools.   Past Medical History:  Diagnosis Date   Allergic rhinitis    BPH with obstruction/lower urinary tract symptoms    Cutaneous eruption    Earache    Epididymitis, left    Hyperglycemia    Hypertension    Hypogonadism in male    Kidney function test abnormal    Overweight    Scrotal pain    Sleep apnea     Past Surgical History:  Procedure Laterality Date   COLONOSCOPY WITH PROPOFOL N/A 07/04/2022   Procedure: COLONOSCOPY WITH PROPOFOL;  Surgeon: Wyline Mood, MD;  Location: [facility] ENDOSCOPY;  Service: Gastroenterology;  Laterality: N/A;   EYELID LACERATION REPAIR  01/2017   cut the the eye lid to drain the infection out   WISDOM TOOTH EXTRACTION      Current Medications: Current Meds  Medication Sig   amLODipine-benazepril (LOTREL) 5-20 MG capsule Take 1 capsule by mouth every evening.   Cholecalciferol (VITAMIN D3) 50 MCG (2000 UT) capsule Take 1 capsule (2,000 Units total) by mouth daily.   Cyanocobalamin (B-12) 1000 MCG SUBL Place 1  tablet under the tongue 3 (three) times a week.   fluorouracil (EFUDEX) 5 % cream Apply topically.   halobetasol (ULTRAVATE) 0.05 % cream Apply topically.   hydrochlorothiazide (HYDRODIURIL) 12.5 MG tablet TAKE 1 TABLET BY MOUTH IN THE MORNING   rosuvastatin (CRESTOR) 10 MG tablet Take 1 tablet by mouth once daily   SYRINGE-NEEDLE, DISP, 3 ML (MONOJECT SAFETY SYRINGE/SHIELD) 23G X 1" 3 ML MISC Inject 1mL intramuscular every 10days   tadalafil (CIALIS) 20 MG tablet Take 20 mg by mouth daily as needed.   testosterone cypionate (DEPOTESTOSTERONE CYPIONATE) 200 MG/ML injection Inject 75 mg into the muscle once a week.     Allergies:   Valsartan   Social History   Socioeconomic History   Marital status: Married    Spouse name: July    Number of children: 3   Years of education: Not on file   Highest education level: Bachelor's degree (e.g., BA, AB, BS)  Occupational History   Occupation: Acupuncturist   Tobacco Use   Smoking status: Never   Smokeless tobacco: Never  Vaping Use   Vaping Use: Never used  Substance and Sexual Activity   Alcohol use: No    Comment: occasional   Drug use: No   Sexual activity: Yes    Partners: Female  Other Topics Concern   Not on file  Social History Narrative   Married,  has three children,    Twin daughters still in middle school   Son is going to school at [employer]    He also has a step son    Social Determinants of Health   Financial Resource Strain: Low Risk  (06/14/2022)   Overall Financial Resource Strain (CARDIA)    Difficulty of Paying Living Expenses: Not hard at all  Food Insecurity: No Food Insecurity (06/14/2022)   Hunger Vital Sign    Worried About Running Out of Food in the Last Year: Never true    Ran Out of Food in the Last Year: Never true  Transportation Needs: No Transportation Needs (06/14/2022)   PRAPARE - Administrator, Civil Service (Medical): No    Lack of Transportation (Non-Medical): No  Physical  Activity: Sufficiently Active (06/14/2022)   Exercise Vital Sign    Days of Exercise per Week: 5 days    Minutes of Exercise per Session: 40 min  Stress: No Stress Concern Present (06/14/2022)   [employer] of Occupational Health - Occupational Stress Questionnaire    Feeling of Stress : Not at all  Social Connections: Moderately Integrated (06/14/2022)   Social Connection and Isolation Panel [NHANES]    Frequency of Communication with Friends and Family: More than three times a week    Frequency of Social Gatherings with Friends and Family: More than three times a week    Attends Religious Services: Never    Database administrator or Organizations: Yes    Attends Engineer, structural: More than 4 times per year    Marital Status: Married     Family History: The patient's family history includes Diabetes in his brother and mother; Hypertension in his brother, father, and mother; Prostate cancer in his father; Skin cancer in his sister; Thyroid disease in his father. There is no history of Kidney disease or Bladder Cancer.  ROS:   Please see the history of present illness.     All other systems reviewed and are negative.  EKGs/Labs/Other Studies Reviewed:    The following studies were reviewed today:   EKG:  EKG is ordered today.  EKG shows normal sinus rhythm, normal ECG, heart rate 78  Recent Labs: 06/14/2022: ALT 29; BUN 19; Creat 1.14; Hemoglobin 16.5; Platelets 204; Potassium 4.0; Sodium 140; TSH 2.48  Recent Lipid Panel    Component Value Date/Time   CHOL 144 06/14/2022 0944   CHOL 158 11/19/2018 0803   TRIG 117 06/14/2022 0944   HDL 41 06/14/2022 0944   HDL 35 (L) 11/19/2018 0803   CHOLHDL 3.5 06/14/2022 0944   VLDL 12 07/10/2016 0835   LDLCALC 82 06/14/2022 0944    Physical Exam:    VS:  BP 124/68 (BP Location: Left Arm, Patient Position: Sitting, Cuff Size: Large)   Pulse 78   Ht 6\' 1"  (1.854 m)   Wt 259 lb 9.6 oz (117.8 kg)   SpO2 96%   BMI  34.25 kg/m     Wt Readings from Last 3 Encounters:  07/06/22 259 lb 9.6 oz (117.8 kg)  07/04/22 258 lb (117 kg)  06/14/22 273 lb (123.8 kg)     GEN:  Well nourished, well developed in no acute distress HEENT: Normal NECK: No JVD; No carotid bruits CARDIAC: RRR, no murmurs, rubs, gallops RESPIRATORY:  Clear to auscultation without rales, wheezing or rhonchi  ABDOMEN: Soft, non-tender, non-distended MUSCULOSKELETAL:  No edema; No deformity  SKIN: Warm and dry NEUROLOGIC:  Alert and oriented  x 3 PSYCHIATRIC:  Normal affect   ASSESSMENT:    1. Primary hypertension     PLAN:    In order of problems listed above:  Hypertension, BP controlled on current medications.  Blood pressure swings, low BP and associated dizziness associated with testosterone stoppage. Continue amlodipine 5, benazepril 20 mg, HCTZ 12.5 mg daily.  Advised to hold HCTZ if he is gone at goal of testosterone and or persistent dizziness and low blood pressures noted.  BP is otherwise adequately controlled, patient asymptomatic.   Follow-up as needed.  This note was generated in part or whole with voice recognition software. Voice recognition is usually quitovercorrectede accurate but there are transcription errors that can and very often do occur. I apologize for any typographical errors that were not detected and corrected.  Medication Adjustments/Labs and Tests Ordered: Current medicines are reviewed at length with the patient today.  Concerns regarding medicines are outlined above.  Orders Placed This Encounter  Procedures   EKG 12-Lead   No orders of the defined types were placed in this encounter.   Patient Instructions  Medication Instructions:   Your physician recommends that you continue on your current medications as directed. Please refer to the Current Medication list given to you today.  *If you need a refill on your cardiac medications before your next appointment, please call your  pharmacy*   Lab Work:  None Ordered  If you have labs (blood work) drawn today and your tests are completely normal, you will receive your results only by: MyChart Message (if you have MyChart) OR A paper copy in the mail If you have any lab test that is abnormal or we need to change your treatment, we will call you to review the results.   Testing/Procedures:  None Ordered    Follow-Up: At [facility], you and your health needs are our priority.  As part of our continuing mission to provide you with exceptional heart care, we have created designated Provider Care Teams.  These Care Teams include your primary Cardiologist (physician) and Advanced Practice Providers (APPs -  Physician Assistants and Nurse Practitioners) who all work together to provide you with the care you need, when you need it.  We recommend signing up for the patient portal called "MyChart".  Sign up information is provided on this After Visit Summary.  MyChart is used to connect with patients for Virtual Visits (Telemedicine).  Patients are able to view lab/test results, encounter notes, upcoming appointments, etc.  Non-urgent messages can be sent to your provider as well.   To learn more about what you can do with MyChart, go to [email or web address].    Your next appointment:    AS NEEDED    Signed, Debbe Odea, MD  07/06/2022 9:23 AM    [facility]

## 2022-07-10 ENCOUNTER — Ambulatory Visit
Admission: RE | Admit: 2022-07-10 | Discharge: 2022-07-10 | Disposition: A | Discharge status: Home / Self Care | Payer: BC Managed Care – PPO | Source: Ambulatory Visit | Attending: Family Medicine | Admitting: Family Medicine

## 2022-07-10 DIAGNOSIS — R072 Precordial pain: Secondary | ICD-10-CM | POA: Insufficient documentation

## 2022-07-11 ENCOUNTER — Encounter: Payer: Self-pay | Admitting: Family Medicine

## 2022-07-12 ENCOUNTER — Ambulatory Visit: Payer: BC Managed Care – PPO | Attending: Cardiology | Admitting: Cardiology

## 2022-07-12 ENCOUNTER — Encounter: Payer: Self-pay | Admitting: Cardiology

## 2022-07-12 VITALS — BP 136/80 | HR 83 | Ht 73.0 in | Wt 263.2 lb

## 2022-07-12 DIAGNOSIS — I10 Essential (primary) hypertension: Secondary | ICD-10-CM | POA: Diagnosis not present

## 2022-07-12 DIAGNOSIS — I2510 Atherosclerotic heart disease of native coronary artery without angina pectoris: Secondary | ICD-10-CM | POA: Diagnosis not present

## 2022-07-12 DIAGNOSIS — E785 Hyperlipidemia, unspecified: Secondary | ICD-10-CM | POA: Diagnosis not present

## 2022-07-12 DIAGNOSIS — R072 Precordial pain: Secondary | ICD-10-CM | POA: Diagnosis not present

## 2022-07-12 MED ORDER — IVABRADINE HCL 5 MG PO TABS: 10.0000 mg | ORAL_TABLET | Freq: Once | ORAL | 0 refills | Status: AC

## 2022-07-12 MED ORDER — METOPROLOL TARTRATE 100 MG PO TABS: 100.0000 mg | ORAL_TABLET | Freq: Once | ORAL | 0 refills | Status: DC

## 2022-07-12 MED ORDER — ROSUVASTATIN CALCIUM 20 MG PO TABS
20.0000 mg | ORAL_TABLET | Freq: Every day | ORAL | 0 refills | Status: DC
Start: 2022-07-12 — End: 2022-12-01

## 2022-07-12 MED ORDER — ASPIRIN 81 MG PO TBEC: 81.0000 mg | DELAYED_RELEASE_TABLET | Freq: Every day | ORAL | 3 refills | Status: AC

## 2022-07-12 NOTE — Progress Notes (Signed)
Cardiology Office Note:    Date:  07/12/2022   ID:  [name], DOB Dec 12, 1960, MRN [id number]  PCP:  Alba Cory, MD  Cardiologist:  Debbe Odea, MD  Electrophysiologist:  None   Referring MD: Alba Cory, MD   Chief Complaint  Patient presents with   Results    Here to discuss recent CT cardiac scoring ordered by PCP    History of Present Illness:    Seth Meadows is a 62 y.o. male with a hx of hypertension, CAD (three-vessel coronary calcifications, coronary calcium score 3449), hypogonadism on testosterone, OSA on CPAP who presents due to coronary calcification and chest pain.  Recently had a coronary calcium score, three-vessel coronary calcification noted, calcium score 3449.  Endorse having shortness of breath and chest pressure especially when going up stairs.  Compliant with medications as prescribed, BP well-controlled.  Prior notes CT cardiac score 06/2022, three-vessel coronary calcification, coronary calcium score 3449, 99th percentile, Valsartan caused loose stools.   Past Medical History:  Diagnosis Date   Allergic rhinitis    BPH with obstruction/lower urinary tract symptoms    Cutaneous eruption    Earache    Epididymitis, left    Hyperglycemia    Hypertension    Hypogonadism in male    Kidney function test abnormal    Overweight    Scrotal pain    Sleep apnea     Past Surgical History:  Procedure Laterality Date   COLONOSCOPY WITH PROPOFOL N/A 07/04/2022   Procedure: COLONOSCOPY WITH PROPOFOL;  Surgeon: Wyline Mood, MD;  Location: [facility] ENDOSCOPY;  Service: Gastroenterology;  Laterality: N/A;   EYELID LACERATION REPAIR  01/2017   cut the the eye lid to drain the infection out   WISDOM TOOTH EXTRACTION      Current Medications: Current Meds  Medication Sig   amLODipine-benazepril (LOTREL) 5-20 MG capsule Take 1 capsule by mouth every evening.   aspirin EC 81 MG tablet Take 1 tablet (81 mg total) by mouth daily. Swallow whole.    Cholecalciferol (VITAMIN D3) 50 MCG (2000 UT) capsule Take 1 capsule (2,000 Units total) by mouth daily.   Cyanocobalamin (B-12) 1000 MCG SUBL Place 1 tablet under the tongue 3 (three) times a week.   fluorouracil (EFUDEX) 5 % cream Apply topically.   halobetasol (ULTRAVATE) 0.05 % cream Apply topically.   hydrochlorothiazide (HYDRODIURIL) 12.5 MG tablet TAKE 1 TABLET BY MOUTH IN THE MORNING   ivabradine (CORLANOR) 5 MG TABS tablet Take 2 tablets (10 mg total) by mouth once for 1 dose. TAKE TWO HOURS PRIOR TO CARDIAC CT   metoprolol tartrate (LOPRESSOR) 100 MG tablet Take 1 tablet (100 mg total) by mouth once for 1 dose. TAKE 2 HOURS PRIOR TO CARDIAC CT   SYRINGE-NEEDLE, DISP, 3 ML (MONOJECT SAFETY SYRINGE/SHIELD) 23G X 1" 3 ML MISC Inject 1mL intramuscular every 10days   tadalafil (CIALIS) 20 MG tablet Take 20 mg by mouth daily as needed.   testosterone cypionate (DEPOTESTOSTERONE CYPIONATE) 200 MG/ML injection Inject 75 mg into the muscle once a week.   [DISCONTINUED] rosuvastatin (CRESTOR) 10 MG tablet Take 1 tablet by mouth once daily     Allergies:   Valsartan   Social History   Socioeconomic History   Marital status: Married    Spouse name: July    Number of children: 3   Years of education: Not on file   Highest education level: Bachelor's degree (e.g., BA, AB, BS)  Occupational History   Occupation: Acupuncturist  Tobacco Use   Smoking status: Never   Smokeless tobacco: Never  Vaping Use   Vaping Use: Never used  Substance and Sexual Activity   Alcohol use: No    Comment: occasional   Drug use: No   Sexual activity: Yes    Partners: Female  Other Topics Concern   Not on file  Social History Narrative   Married, has three children,    Twin daughters still in middle school   Son is going to school at [employer]    He also has a step son    Social Determinants of Health   Financial Resource Strain: Low Risk  (06/14/2022)   Overall Financial Resource Strain (CARDIA)     Difficulty of Paying Living Expenses: Not hard at all  Food Insecurity: No Food Insecurity (06/14/2022)   Hunger Vital Sign    Worried About Running Out of Food in the Last Year: Never true    Ran Out of Food in the Last Year: Never true  Transportation Needs: No Transportation Needs (06/14/2022)   PRAPARE - Administrator, Civil Service (Medical): No    Lack of Transportation (Non-Medical): No  Physical Activity: Sufficiently Active (06/14/2022)   Exercise Vital Sign    Days of Exercise per Week: 5 days    Minutes of Exercise per Session: 40 min  Stress: No Stress Concern Present (06/14/2022)   [employer] of Occupational Health - Occupational Stress Questionnaire    Feeling of Stress : Not at all  Social Connections: Moderately Integrated (06/14/2022)   Social Connection and Isolation Panel [NHANES]    Frequency of Communication with Friends and Family: More than three times a week    Frequency of Social Gatherings with Friends and Family: More than three times a week    Attends Religious Services: Never    Database administrator or Organizations: Yes    Attends Engineer, structural: More than 4 times per year    Marital Status: Married     Family History: The patient's family history includes Diabetes in his brother and mother; Hypertension in his brother, father, and mother; Prostate cancer in his father; Skin cancer in his sister; Thyroid disease in his father. There is no history of Kidney disease or Bladder Cancer.  ROS:   Please see the history of present illness.     All other systems reviewed and are negative.  EKGs/Labs/Other Studies Reviewed:    The following studies were reviewed today:   EKG:  EKG not ordered today.  Recent Labs: 06/14/2022: ALT 29; BUN 19; Creat 1.14; Hemoglobin 16.5; Platelets 204; Potassium 4.0; Sodium 140; TSH 2.48  Recent Lipid Panel    Component Value Date/Time   CHOL 144 06/14/2022 0944   CHOL 158 11/19/2018  0803   TRIG 117 06/14/2022 0944   HDL 41 06/14/2022 0944   HDL 35 (L) 11/19/2018 0803   CHOLHDL 3.5 06/14/2022 0944   VLDL 12 07/10/2016 0835   LDLCALC 82 06/14/2022 0944    Physical Exam:    VS:  BP 136/80 (BP Location: Left Arm, Patient Position: Sitting, Cuff Size: Normal)   Pulse 83   Ht 6\' 1"  (1.854 m)   Wt 263 lb 3.2 oz (119.4 kg)   SpO2 97%   BMI 34.73 kg/m     Wt Readings from Last 3 Encounters:  07/12/22 263 lb 3.2 oz (119.4 kg)  07/06/22 259 lb 9.6 oz (117.8 kg)  07/04/22 258 lb (117 kg)  GEN:  Well nourished, well developed in no acute distress HEENT: Normal NECK: No JVD; No carotid bruits CARDIAC: RRR, no murmurs, rubs, gallops RESPIRATORY:  Clear to auscultation without rales, wheezing or rhonchi  ABDOMEN: Soft, non-tender, non-distended MUSCULOSKELETAL:  No edema; No deformity  SKIN: Warm and dry NEUROLOGIC:  Alert and oriented x 3 PSYCHIATRIC:  Normal affect   ASSESSMENT:    1. Precordial pain   2. Coronary artery disease without angina pectoris, unspecified vessel or lesion type, unspecified whether native or transplanted heart   3. Primary hypertension   4. Hyperlipidemia LDL goal <70    PLAN:    In order of problems listed above:  Chest pain with exertion, very high coronary calcium score of 3449.  Start aspirin 81 mg daily, increase Crestor to 20 mg daily.  Get echocardiogram, get coronary CTA.  Pretest probability is high, patient will likely need left heart cath regardless of noninvasive test findings.  This was discussed with patient.  Plan left heart cath if significant obstruction noted on coronary CT. CAD, three-vessel calcifications, echo and coronary CT as above.  Aspirin, Crestor 20 mg daily Hypertension, BP controlled, continue Norvasc 5, benazepril 20, HCTZ 12.5 daily. Hyperlipidemia, LDL goal less than 70, increase Crestor to 20 mg daily.  Follow-up after cardiac testing. Informed Consent   Shared Decision Making/Informed  Consent The risks [stroke (1 in 1000), death (1 in 1000), kidney failure [usually temporary] (1 in 500), bleeding (1 in 200), allergic reaction [possibly serious] (1 in 200)], benefits (diagnostic support and management of coronary artery disease) and alternatives of a cardiac catheterization were discussed in detail with Mr. Walde and he is willing to proceed.      Medication Adjustments/Labs and Tests Ordered: Current medicines are reviewed at length with the patient today.  Concerns regarding medicines are outlined above.  Orders Placed This Encounter  Procedures   CT CORONARY MORPH W/CTA COR W/SCORE W/CA W/CM &/OR WO/CM   ECHOCARDIOGRAM COMPLETE   Meds ordered this encounter  Medications   rosuvastatin (CRESTOR) 20 MG tablet    Sig: Take 1 tablet (20 mg total) by mouth daily.    Dispense:  90 tablet    Refill:  0   metoprolol tartrate (LOPRESSOR) 100 MG tablet    Sig: Take 1 tablet (100 mg total) by mouth once for 1 dose. TAKE 2 HOURS PRIOR TO CARDIAC CT    Dispense:  1 tablet    Refill:  0   ivabradine (CORLANOR) 5 MG TABS tablet    Sig: Take 2 tablets (10 mg total) by mouth once for 1 dose. TAKE TWO HOURS PRIOR TO CARDIAC CT    Dispense:  2 tablet    Refill:  0    CASH PAY PRICE PLEASE   aspirin EC 81 MG tablet    Sig: Take 1 tablet (81 mg total) by mouth daily. Swallow whole.    Dispense:  90 tablet    Refill:  3    Patient Instructions  Medication Instructions:   START Aspirin  - Take one tablet ( 81mg ) by mouth daily.  INCREASE Rosuvastatin - Take one tablet ( 20mg ) by mouth daily.   *If you need a refill on your cardiac medications before your next appointment, please call your pharmacy*   Lab Work:  None Ordered  If you have labs (blood work) drawn today and your tests are completely normal, you will receive your results only by: MyChart Message (if you have MyChart) OR A paper  copy in the mail If you have any lab test that is abnormal or we need to change  your treatment, we will call you to review the results.   Testing/Procedures:  Your physician has requested that you have an echocardiogram. Echocardiography is a painless test that uses sound waves to create images of your heart. It provides your doctor with information about the size and shape of your heart and how well your heart's chambers and valves are working. This procedure takes approximately one hour. There are no restrictions for this procedure. Please do NOT wear cologne, perfume, aftershave, or lotions (deodorant is allowed). Please arrive 15 minutes prior to your appointment time.    Your cardiac CT will be scheduled at one of the below locations:   [facility] [address] [place], [place] [address] [phone]  OR   [facility] [address] [place], [place] [address] [phone]  If scheduled at [facility] or [facility], please arrive 15 mins early for check-in and test prep.   Please follow these instructions carefully (unless otherwise directed):  Hold all erectile dysfunction medications at least 3 days (72 hrs) prior to test. (Ie viagra, cialis, sildenafil, tadalafil, etc) We will administer nitroglycerin during this exam.   On the Night Before the Test: Be sure to Drink plenty of water. Do not consume any caffeinated/decaffeinated beverages or chocolate 12 hours prior to your test. Do not take any antihistamines 12 hours prior to your test.  On the Day of the Test: Drink plenty of water until 1 hour prior to the test. Do not eat any food 1 hour prior to test. You may take your regular medications prior to the test.  Take metoprolol (Lopressor) two hours prior to test. If you take Furosemide/Hydrochlorothiazide/Spironolactone, please HOLD on the morning of the test.  After the Test: Drink plenty of water. After receiving IV  contrast, you may experience a mild flushed feeling. This is normal. On occasion, you may experience a mild rash up to 24 hours after the test. This is not dangerous. If this occurs, you can take Benadryl 25 mg and increase your fluid intake. If you experience trouble breathing, this can be serious. If it is severe call 911 IMMEDIATELY. If it is mild, please call our office. If you take any of these medications: Glipizide/Metformin, Avandament, Glucavance, please do not take 48 hours after completing test unless otherwise instructed.  For scheduling needs, including cancellations and rescheduling, please call [place], [phone].   Follow-Up: At [facility], you and your health needs are our priority.  As part of our continuing mission to provide you with exceptional heart care, we have created designated Provider Care Teams.  These Care Teams include your primary Cardiologist (physician) and Advanced Practice Providers (APPs -  Physician Assistants and Nurse Practitioners) who all work together to provide you with the care you need, when you need it.  We recommend signing up for the patient portal called "MyChart".  Sign up information is provided on this After Visit Summary.  MyChart is used to connect with patients for Virtual Visits (Telemedicine).  Patients are able to view lab/test results, encounter notes, upcoming appointments, etc.  Non-urgent messages can be sent to your provider as well.   To learn more about what you can do with MyChart, go to [email or web address].    Your next appointment:    After testing  Provider:   You  may see Debbe Odea, MD or one of the following Advanced Practice Providers on your designated Care Team:   Nicolasa Ducking, NP Eula Listen, PA-C Cadence Fransico Michael, PA-C Charlsie Quest, NP   Signed, Debbe Odea, MD  07/12/2022 12:18 PM    [facility]

## 2022-07-12 NOTE — H&P (View-Only) (Signed)
Cardiology Office Note:    Date:  07/12/2022   ID:  Seth Meadows, DOB 12/25/1960, MRN [id number]  PCP:  Sowles, Krichna, MD  Cardiologist:  Rochelle Larue Agbor-Etang, MD  Electrophysiologist:  None   Referring MD: Sowles, Krichna, MD   Chief Complaint  Patient presents with   Results    Here to discuss recent CT cardiac scoring ordered by PCP    History of Present Illness:    Seth Meadows is a 62 y.o. male with a hx of hypertension, CAD (three-vessel coronary calcifications, coronary calcium score 3449), hypogonadism on testosterone, OSA on CPAP who presents due to coronary calcification and chest pain.  Recently had a coronary calcium score, three-vessel coronary calcification noted, calcium score 3449.  Endorse having shortness of breath and chest pressure especially when going up stairs.  Compliant with medications as prescribed, BP well-controlled.  Prior notes CT cardiac score 06/2022, three-vessel coronary calcification, coronary calcium score 3449, 99th percentile, Valsartan caused loose stools.   Past Medical History:  Diagnosis Date   Allergic rhinitis    BPH with obstruction/lower urinary tract symptoms    Cutaneous eruption    Earache    Epididymitis, left    Hyperglycemia    Hypertension    Hypogonadism in male    Kidney function test abnormal    Overweight    Scrotal pain    Sleep apnea     Past Surgical History:  Procedure Laterality Date   COLONOSCOPY WITH PROPOFOL N/A 07/04/2022   Procedure: COLONOSCOPY WITH PROPOFOL;  Surgeon: Anna, Kiran, MD;  Location: [facility]COPY;  Service: Gastroenterology;  Laterality: N/A;   EYELID LACERATION REPAIR  01/2017   cut the the eye lid to drain the infection out   WISDOM TOOTH EXTRACTION      Current Medications: Current Meds  Medication Sig   amLODipine-benazepril (LOTREL) 5-20 MG capsule Take 1 capsule by mouth every evening.   aspirin EC 81 MG tablet Take 1 tablet (81 mg total) by mouth daily. Swallow whole.    Cholecalciferol (VITAMIN D3) 50 MCG (2000 UT) capsule Take 1 capsule (2,000 Units total) by mouth daily.   Cyanocobalamin (B-12) 1000 MCG SUBL Place 1 tablet under the tongue 3 (three) times a week.   fluorouracil (EFUDEX) 5 % cream Apply topically.   halobetasol (ULTRAVATE) 0.05 % cream Apply topically.   hydrochlorothiazide (HYDRODIURIL) 12.5 MG tablet TAKE 1 TABLET BY MOUTH IN THE MORNING   ivabradine (CORLANOR) 5 MG TABS tablet Take 2 tablets (10 mg total) by mouth once for 1 dose. TAKE TWO HOURS PRIOR TO CARDIAC CT   metoprolol tartrate (LOPRESSOR) 100 MG tablet Take 1 tablet (100 mg total) by mouth once for 1 dose. TAKE 2 HOURS PRIOR TO CARDIAC CT   SYRINGE-NEEDLE, DISP, 3 ML (MONOJECT SAFETY SYRINGE/SHIELD) 23G X 1" 3 ML MISC Inject 1mL intramuscular every 10days   tadalafil (CIALIS) 20 MG tablet Take 20 mg by mouth daily as needed.   testosterone cypionate (DEPOTESTOSTERONE CYPIONATE) 200 MG/ML injection Inject 75 mg into the muscle once a week.   [DISCONTINUED] rosuvastatin (CRESTOR) 10 MG tablet Take 1 tablet by mouth once daily     Allergies:   Valsartan   Social History   Socioeconomic History   Marital status: Married    Spouse name: July    Number of children: 3   Years of education: Not on file   Highest education level: Bachelor's degree (e.g., BA, AB, BS)  Occupational History   Occupation: electrical engineer     Tobacco Use   Smoking status: Never   Smokeless tobacco: Never  Vaping Use   Vaping Use: Never used  Substance and Sexual Activity   Alcohol use: No    Comment: occasional   Drug use: No   Sexual activity: Yes    Partners: Female  Other Topics Concern   Not on file  Social History Narrative   Married, has three children,    Twin daughters still in middle school   Son is going to school at [employer]    He also has a step son    Social Determinants of Health   Financial Resource Strain: Low Risk  (06/14/2022)   Overall Financial Resource Strain (CARDIA)     Difficulty of Paying Living Expenses: Not hard at all  Food Insecurity: No Food Insecurity (06/14/2022)   Hunger Vital Sign    Worried About Running Out of Food in the Last Year: Never true    Ran Out of Food in the Last Year: Never true  Transportation Needs: No Transportation Needs (06/14/2022)   PRAPARE - Transportation    Lack of Transportation (Medical): No    Lack of Transportation (Non-Medical): No  Physical Activity: Sufficiently Active (06/14/2022)   Exercise Vital Sign    Days of Exercise per Week: 5 days    Minutes of Exercise per Session: 40 min  Stress: No Stress Concern Present (06/14/2022)   [employer] - Occupational Stress Questionnaire    Feeling of Stress : Not at all  Social Connections: Moderately Integrated (06/14/2022)   Social Connection and Isolation Panel [NHANES]    Frequency of Communication with Friends and Family: More than three times a week    Frequency of Social Gatherings with Friends and Family: More than three times a week    Attends Religious Services: Never    Active Member of Clubs or Organizations: Yes    Attends Club or Organization Meetings: More than 4 times per year    Marital Status: Married     Family History: The patient's family history includes Diabetes in his brother and mother; Hypertension in his brother, father, and mother; Prostate cancer in his father; Skin cancer in his sister; Thyroid disease in his father. There is no history of Kidney disease or Bladder Cancer.  ROS:   Please see the history of present illness.     All other systems reviewed and are negative.  EKGs/Labs/Other Studies Reviewed:    The following studies were reviewed today:   EKG:  EKG not ordered today.  Recent Labs: 06/14/2022: ALT 29; BUN 19; Creat 1.14; Hemoglobin 16.5; Platelets 204; Potassium 4.0; Sodium 140; TSH 2.48  Recent Lipid Panel    Component Value Date/Time   CHOL 144 06/14/2022 0944   CHOL 158 11/19/2018  0803   TRIG 117 06/14/2022 0944   HDL 41 06/14/2022 0944   HDL 35 (L) 11/19/2018 0803   CHOLHDL 3.5 06/14/2022 0944   VLDL 12 07/10/2016 0835   LDLCALC 82 06/14/2022 0944    Physical Exam:    VS:  BP 136/80 (BP Location: Left Arm, Patient Position: Sitting, Cuff Size: Normal)   Pulse 83   Ht 6' 1" (1.854 m)   Wt 263 lb 3.2 oz (119.4 kg)   SpO2 97%   BMI 34.73 kg/m     Wt Readings from Last 3 Encounters:  07/12/22 263 lb 3.2 oz (119.4 kg)  07/06/22 259 lb 9.6 oz (117.8 kg)  07/04/22 258 lb (117 kg)       GEN:  Well nourished, well developed in no acute distress HEENT: Normal NECK: No JVD; No carotid bruits CARDIAC: RRR, no murmurs, rubs, gallops RESPIRATORY:  Clear to auscultation without rales, wheezing or rhonchi  ABDOMEN: Soft, non-tender, non-distended MUSCULOSKELETAL:  No edema; No deformity  SKIN: Warm and dry NEUROLOGIC:  Alert and oriented x 3 PSYCHIATRIC:  Normal affect   ASSESSMENT:    1. Precordial pain   2. Coronary artery disease without angina pectoris, unspecified vessel or lesion type, unspecified whether native or transplanted heart   3. Primary hypertension   4. Hyperlipidemia LDL goal <70    PLAN:    In order of problems listed above:  Chest pain with exertion, very high coronary calcium score of 3449.  Start aspirin 81 mg daily, increase Crestor to 20 mg daily.  Get echocardiogram, get coronary CTA.  Pretest probability is high, patient will likely need left heart cath regardless of noninvasive test findings.  This was discussed with patient.  Plan left heart cath if significant obstruction noted on coronary CT. CAD, three-vessel calcifications, echo and coronary CT as above.  Aspirin, Crestor 20 mg daily Hypertension, BP controlled, continue Norvasc 5, benazepril 20, HCTZ 12.5 daily. Hyperlipidemia, LDL goal less than 70, increase Crestor to 20 mg daily.  Follow-up after cardiac testing. Informed Consent   Shared Decision Making/Informed  Consent The risks [stroke (1 in 1000), death (1 in 1000), kidney failure [usually temporary] (1 in 500), bleeding (1 in 200), allergic reaction [possibly serious] (1 in 200)], benefits (diagnostic support and management of coronary artery disease) and alternatives of a cardiac catheterization were discussed in detail with Mr. Kluth and he is willing to proceed.      Medication Adjustments/Labs and Tests Ordered: Current medicines are reviewed at length with the patient today.  Concerns regarding medicines are outlined above.  Orders Placed This Encounter  Procedures   CT CORONARY MORPH W/CTA COR W/SCORE W/CA W/CM &/OR WO/CM   ECHOCARDIOGRAM COMPLETE   Meds ordered this encounter  Medications   rosuvastatin (CRESTOR) 20 MG tablet    Sig: Take 1 tablet (20 mg total) by mouth daily.    Dispense:  90 tablet    Refill:  0   metoprolol tartrate (LOPRESSOR) 100 MG tablet    Sig: Take 1 tablet (100 mg total) by mouth once for 1 dose. TAKE 2 HOURS PRIOR TO CARDIAC CT    Dispense:  1 tablet    Refill:  0   ivabradine (CORLANOR) 5 MG TABS tablet    Sig: Take 2 tablets (10 mg total) by mouth once for 1 dose. TAKE TWO HOURS PRIOR TO CARDIAC CT    Dispense:  2 tablet    Refill:  0    CASH PAY PRICE PLEASE   aspirin EC 81 MG tablet    Sig: Take 1 tablet (81 mg total) by mouth daily. Swallow whole.    Dispense:  90 tablet    Refill:  3    Patient Instructions  Medication Instructions:   START Aspirin  - Take one tablet ( 81mg) by mouth daily.  INCREASE Rosuvastatin - Take one tablet ( 20mg) by mouth daily.   *If you need a refill on your cardiac medications before your next appointment, please call your pharmacy*   Lab Work:  None Ordered  If you have labs (blood work) drawn today and your tests are completely normal, you will receive your results only by: MyChart Message (if you have MyChart) OR A paper   copy in the mail If you have any lab test that is abnormal or we need to change  your treatment, we will call you to review the results.   Testing/Procedures:  Your physician has requested that you have an echocardiogram. Echocardiography is a painless test that uses sound waves to create images of your heart. It provides your doctor with information about the size and shape of your heart and how well your heart's chambers and valves are working. This procedure takes approximately one hour. There are no restrictions for this procedure. Please do NOT wear cologne, perfume, aftershave, or lotions (deodorant is allowed). Please arrive 15 minutes prior to your appointment time.    Your cardiac CT will be scheduled at one of the below locations:   [facility] [address] [place], [place] [address] [phone]  OR   [facility] [address] [place], [place] [address] [phone]  If scheduled at [facility] or [facility], please arrive 15 mins early for check-in and test prep.   Please follow these instructions carefully (unless otherwise directed):  Hold all erectile dysfunction medications at least 3 days (72 hrs) prior to test. (Ie viagra, cialis, sildenafil, tadalafil, etc) We will administer nitroglycerin during this exam.   On the Night Before the Test: Be sure to Drink plenty of water. Do not consume any caffeinated/decaffeinated beverages or chocolate 12 hours prior to your test. Do not take any antihistamines 12 hours prior to your test.  On the Day of the Test: Drink plenty of water until 1 hour prior to the test. Do not eat any food 1 hour prior to test. You may take your regular medications prior to the test.  Take metoprolol (Lopressor) two hours prior to test. If you take Furosemide/Hydrochlorothiazide/Spironolactone, please HOLD on the morning of the test.  After the Test: Drink plenty of water. After receiving IV  contrast, you may experience a mild flushed feeling. This is normal. On occasion, you may experience a mild rash up to 24 hours after the test. This is not dangerous. If this occurs, you can take Benadryl 25 mg and increase your fluid intake. If you experience trouble breathing, this can be serious. If it is severe call 911 IMMEDIATELY. If it is mild, please call our office. If you take any of these medications: Glipizide/Metformin, Avandament, Glucavance, please do not take 48 hours after completing test unless otherwise instructed.  For scheduling needs, including cancellations and rescheduling, please call [place], [phone].   Follow-Up: At [facility], you and your health needs are our priority.  As part of our continuing mission to provide you with exceptional heart care, we have created designated Provider Care Teams.  These Care Teams include your primary Cardiologist (physician) and Advanced Practice Providers (APPs -  Physician Assistants and Nurse Practitioners) who all work together to provide you with the care you need, when you need it.  We recommend signing up for the patient portal called "MyChart".  Sign up information is provided on this After Visit Summary.  MyChart is used to connect with patients for Virtual Visits (Telemedicine).  Patients are able to view lab/test results, encounter notes, upcoming appointments, etc.  Non-urgent messages can be sent to your provider as well.   To learn more about what you can do with MyChart, go to [email or web address].    Your next appointment:    After testing  Provider:   You   may see Rahmel Nedved Agbor-Etang, MD or one of the following Advanced Practice Providers on your designated Care Team:   Christopher Berge, NP Ryan Dunn, PA-C Cadence Furth, PA-C Sheri Hammock, NP   Signed, Navaya Wiatrek Agbor-Etang, MD  07/12/2022 12:18 PM    [facility] 

## 2022-07-12 NOTE — Patient Instructions (Signed)
Medication Instructions:   START Aspirin  - Take one tablet ( 81mg ) by mouth daily.  INCREASE Rosuvastatin - Take one tablet ( 20mg ) by mouth daily.   *If you need a refill on your cardiac medications before your next appointment, please call your pharmacy*   Lab Work:  None Ordered  If you have labs (blood work) drawn today and your tests are completely normal, you will receive your results only by: MyChart Message (if you have MyChart) OR A paper copy in the mail If you have any lab test that is abnormal or we need to change your treatment, we will call you to review the results.   Testing/Procedures:  Your physician has requested that you have an echocardiogram. Echocardiography is a painless test that uses sound waves to create images of your heart. It provides your doctor with information about the size and shape of your heart and how well your heart's chambers and valves are working. This procedure takes approximately one hour. There are no restrictions for this procedure. Please do NOT wear cologne, perfume, aftershave, or lotions (deodorant is allowed). Please arrive 15 minutes prior to your appointment time.    Your cardiac CT will be scheduled at one of the below locations:   [facility] [address] [place], [place] [address] [phone]  OR   [facility] [address] [place], [place] [address] [phone]  If scheduled at [facility] or [facility], please arrive 15 mins early for check-in and test prep.   Please follow these instructions carefully (unless otherwise directed):  Hold all erectile dysfunction medications at least 3 days (72 hrs) prior to test. (Ie viagra, cialis, sildenafil, tadalafil, etc) We will administer nitroglycerin during this exam.   On the Night Before the Test: Be sure to Drink plenty of water. Do not consume  any caffeinated/decaffeinated beverages or chocolate 12 hours prior to your test. Do not take any antihistamines 12 hours prior to your test.  On the Day of the Test: Drink plenty of water until 1 hour prior to the test. Do not eat any food 1 hour prior to test. You may take your regular medications prior to the test.  Take metoprolol (Lopressor) two hours prior to test. If you take Furosemide/Hydrochlorothiazide/Spironolactone, please HOLD on the morning of the test.  After the Test: Drink plenty of water. After receiving IV contrast, you may experience a mild flushed feeling. This is normal. On occasion, you may experience a mild rash up to 24 hours after the test. This is not dangerous. If this occurs, you can take Benadryl 25 mg and increase your fluid intake. If you experience trouble breathing, this can be serious. If it is severe call 911 IMMEDIATELY. If it is mild, please call our office. If you take any of these medications: Glipizide/Metformin, Avandament, Glucavance, please do not take 48 hours after completing test unless otherwise instructed.  For scheduling needs, including cancellations and rescheduling, please call [place], [phone].   Follow-Up: At [facility], you and your health needs are our priority.  As part of our continuing mission to provide you with exceptional heart care, we have created designated Provider Care Teams.  These Care Teams include your primary Cardiologist (physician) and Advanced Practice Providers (APPs -  Physician Assistants and Nurse Practitioners) who all work together to provide you with the care you need, when you need it.  We recommend signing up  for the patient portal called "MyChart".  Sign up information is provided on this After Visit Summary.  MyChart is used to connect with patients for Virtual Visits (Telemedicine).  Patients are able to view lab/test results, encounter notes, upcoming appointments, etc.  Non-urgent  messages can be sent to your provider as well.   To learn more about what you can do with MyChart, go to [email or web address].    Your next appointment:    After testing  Provider:   You may see Debbe Odea, MD or one of the following Advanced Practice Providers on your designated Care Team:   Nicolasa Ducking, NP Eula Listen, PA-C Cadence Fransico Michael, PA-C Charlsie Quest, NP

## 2022-07-14 ENCOUNTER — Encounter: Payer: Self-pay | Admitting: *Deleted

## 2022-07-17 ENCOUNTER — Ambulatory Visit: Payer: BC Managed Care – PPO | Admitting: Family Medicine

## 2022-07-18 ENCOUNTER — Ambulatory Visit (HOSPITAL_BASED_OUTPATIENT_CLINIC_OR_DEPARTMENT_OTHER)
Admission: RE | Admit: 2022-07-18 | Discharge: 2022-07-18 | Disposition: A | Discharge status: Home / Self Care | Payer: BC Managed Care – PPO | Source: Ambulatory Visit | Attending: Cardiology | Admitting: Cardiology

## 2022-07-18 ENCOUNTER — Ambulatory Visit (INDEPENDENT_AMBULATORY_CARE_PROVIDER_SITE_OTHER): Payer: BC Managed Care – PPO

## 2022-07-18 ENCOUNTER — Other Ambulatory Visit: Payer: Self-pay | Admitting: Cardiology

## 2022-07-18 ENCOUNTER — Ambulatory Visit (HOSPITAL_COMMUNITY)
Admission: RE | Admit: 2022-07-18 | Discharge: 2022-07-18 | Disposition: A | Discharge status: Home / Self Care | Payer: BC Managed Care – PPO | Source: Ambulatory Visit | Attending: Cardiology | Admitting: Cardiology

## 2022-07-18 DIAGNOSIS — I2510 Atherosclerotic heart disease of native coronary artery without angina pectoris: Secondary | ICD-10-CM | POA: Diagnosis not present

## 2022-07-18 DIAGNOSIS — R072 Precordial pain: Secondary | ICD-10-CM | POA: Insufficient documentation

## 2022-07-18 DIAGNOSIS — R931 Abnormal findings on diagnostic imaging of heart and coronary circulation: Secondary | ICD-10-CM

## 2022-07-18 LAB — ECHOCARDIOGRAM COMPLETE
Area-P 1/2: 3.72 cm2
S' Lateral: 2.7 cm

## 2022-07-18 MED ORDER — IOHEXOL 350 MG/ML SOLN
95.0000 mL | Freq: Once | INTRAVENOUS | Status: AC | PRN
  Administered 2022-07-18: 95 mL via INTRAVENOUS

## 2022-07-18 MED ORDER — NITROGLYCERIN 0.4 MG SL SUBL
SUBLINGUAL_TABLET | SUBLINGUAL | Status: AC
  Filled 2022-07-18: qty 2

## 2022-07-18 MED ORDER — NITROGLYCERIN 0.4 MG SL SUBL
0.8000 mg | SUBLINGUAL_TABLET | SUBLINGUAL | Status: DC | PRN
  Administered 2022-07-18: 0.8 mg via SUBLINGUAL

## 2022-07-19 ENCOUNTER — Encounter: Payer: Self-pay | Admitting: Cardiology

## 2022-07-22 ENCOUNTER — Other Ambulatory Visit: Payer: Self-pay | Admitting: Cardiology

## 2022-07-22 DIAGNOSIS — I2510 Atherosclerotic heart disease of native coronary artery without angina pectoris: Secondary | ICD-10-CM

## 2022-07-24 ENCOUNTER — Telehealth: Payer: Self-pay | Admitting: Cardiology

## 2022-07-24 ENCOUNTER — Other Ambulatory Visit
Admission: RE | Admit: 2022-07-24 | Discharge: 2022-07-24 | Disposition: A | Discharge status: Home / Self Care | Payer: BC Managed Care – PPO | Attending: Cardiology | Admitting: Cardiology

## 2022-07-24 ENCOUNTER — Telehealth: Payer: Self-pay | Admitting: *Deleted

## 2022-07-24 DIAGNOSIS — I2510 Atherosclerotic heart disease of native coronary artery without angina pectoris: Secondary | ICD-10-CM | POA: Diagnosis present

## 2022-07-24 DIAGNOSIS — Z01812 Encounter for preprocedural laboratory examination: Secondary | ICD-10-CM

## 2022-07-24 DIAGNOSIS — R072 Precordial pain: Secondary | ICD-10-CM | POA: Insufficient documentation

## 2022-07-24 LAB — BASIC METABOLIC PANEL WITH GFR
Anion gap: 8 (ref 5–15)
BUN: 29 mg/dL — ABNORMAL HIGH (ref 8–23)
CO2: 24 mmol/L (ref 22–32)
Calcium: 8.7 mg/dL — ABNORMAL LOW (ref 8.9–10.3)
Chloride: 103 mmol/L (ref 98–111)
Creatinine, Ser: 1.45 mg/dL — ABNORMAL HIGH (ref 0.61–1.24)
GFR, Estimated: 54 mL/min — ABNORMAL LOW
Glucose, Bld: 100 mg/dL — ABNORMAL HIGH (ref 70–99)
Potassium: 3.6 mmol/L (ref 3.5–5.1)
Sodium: 135 mmol/L (ref 135–145)

## 2022-07-24 LAB — CBC
HCT: 49.9 % (ref 39.0–52.0)
Hemoglobin: 17.1 g/dL — ABNORMAL HIGH (ref 13.0–17.0)
MCH: 29.3 pg (ref 26.0–34.0)
MCHC: 34.3 g/dL (ref 30.0–36.0)
MCV: 85.6 fL (ref 80.0–100.0)
Platelets: 195 10*3/uL (ref 150–400)
RBC: 5.83 MIL/uL — ABNORMAL HIGH (ref 4.22–5.81)
RDW: 12.3 % (ref 11.5–15.5)
WBC: 8.7 10*3/uL (ref 4.0–10.5)
nRBC: 0 % (ref 0.0–0.2)

## 2022-07-24 NOTE — Telephone Encounter (Signed)
Cardiac Catheterization scheduled at [facility] for: Tuesday July 25, 2022 10:30 AM Arrival time [facility] Main Entrance A at: 8:30 AM  Nothing to eat after midnight prior to procedure, clear liquids until 5 AM day of procedure.  Medication instructions: -Hold:  Hydrochlorothiazide-day before and day of procedure-per protocol GFR 54-pt already taken this morning  Amlodipine/benazepril-day before and day of procedure-per protocol GFR 54-pt will hold this evening's dose. -Other usual morning medications can be taken with sips of water including aspirin 81 mg.  Confirmed patient has responsible adult to drive home post procedure and be with patient first 24 hours after arriving home.  Plan to go home the same day, you will only stay overnight if medically necessary.  Reviewed procedure instructions with patient.

## 2022-07-24 NOTE — Telephone Encounter (Signed)
Patient scheduled for [facility] on 7/2 with Dr. Herbie Baltimore. Patient given instructions over the phone and via mychart. Patient denies further questions and verbalizes understanding.

## 2022-07-24 NOTE — Telephone Encounter (Signed)
Pt is requesting a callback regarding a left heart cath beind done after speak with MD. Pt would like to know how to move forward. Please advise

## 2022-07-24 NOTE — Addendum Note (Signed)
Addended by: Vivi Barrack on: 07/24/2022 11:35 AM   Modules accepted: Orders

## 2022-07-25 ENCOUNTER — Encounter (HOSPITAL_COMMUNITY)
Admission: RE | Disposition: A | Discharge status: Home / Self Care | Payer: Self-pay | Source: Home / Self Care | Attending: Cardiology

## 2022-07-25 ENCOUNTER — Other Ambulatory Visit: Payer: Self-pay

## 2022-07-25 ENCOUNTER — Ambulatory Visit (HOSPITAL_COMMUNITY)
Admission: RE | Admit: 2022-07-25 | Discharge: 2022-07-25 | Disposition: A | Discharge status: Home / Self Care | Payer: BC Managed Care – PPO | Attending: Cardiology | Admitting: Cardiology

## 2022-07-25 DIAGNOSIS — R072 Precordial pain: Secondary | ICD-10-CM | POA: Diagnosis not present

## 2022-07-25 DIAGNOSIS — I10 Essential (primary) hypertension: Secondary | ICD-10-CM | POA: Insufficient documentation

## 2022-07-25 DIAGNOSIS — I2584 Coronary atherosclerosis due to calcified coronary lesion: Secondary | ICD-10-CM | POA: Diagnosis not present

## 2022-07-25 DIAGNOSIS — E785 Hyperlipidemia, unspecified: Secondary | ICD-10-CM | POA: Diagnosis not present

## 2022-07-25 DIAGNOSIS — Z79899 Other long term (current) drug therapy: Secondary | ICD-10-CM | POA: Diagnosis not present

## 2022-07-25 DIAGNOSIS — Z7982 Long term (current) use of aspirin: Secondary | ICD-10-CM | POA: Diagnosis not present

## 2022-07-25 DIAGNOSIS — E291 Testicular hypofunction: Secondary | ICD-10-CM | POA: Diagnosis not present

## 2022-07-25 DIAGNOSIS — G4733 Obstructive sleep apnea (adult) (pediatric): Secondary | ICD-10-CM | POA: Diagnosis not present

## 2022-07-25 DIAGNOSIS — I2510 Atherosclerotic heart disease of native coronary artery without angina pectoris: Secondary | ICD-10-CM

## 2022-07-25 HISTORY — PX: LEFT HEART CATH AND CORONARY ANGIOGRAPHY: CATH118249

## 2022-07-25 SURGERY — LEFT HEART CATH AND CORONARY ANGIOGRAPHY
Anesthesia: LOCAL

## 2022-07-25 MED ORDER — IOHEXOL 350 MG/ML SOLN
INTRAVENOUS | Status: DC | PRN
  Administered 2022-07-25: 80 mL

## 2022-07-25 MED ORDER — FENTANYL CITRATE (PF) 100 MCG/2ML IJ SOLN
INTRAMUSCULAR | Status: DC | PRN
  Administered 2022-07-25 (×2): 25 ug via INTRAVENOUS

## 2022-07-25 MED ORDER — LIDOCAINE HCL (PF) 1 % IJ SOLN
INTRAMUSCULAR | Status: AC
  Filled 2022-07-25: qty 30

## 2022-07-25 MED ORDER — HEPARIN SODIUM (PORCINE) 1000 UNIT/ML IJ SOLN
INTRAMUSCULAR | Status: AC
  Filled 2022-07-25: qty 10

## 2022-07-25 MED ORDER — LABETALOL HCL 5 MG/ML IV SOLN
10.0000 mg | INTRAVENOUS | Status: DC | PRN
  Administered 2022-07-25: 10 mg via INTRAVENOUS
  Filled 2022-07-25: qty 4

## 2022-07-25 MED ORDER — MIDAZOLAM HCL 2 MG/2ML IJ SOLN
INTRAMUSCULAR | Status: DC | PRN
  Administered 2022-07-25 (×2): 1 mg via INTRAVENOUS

## 2022-07-25 MED ORDER — SODIUM CHLORIDE 0.9% FLUSH: 3.0000 mL | INTRAVENOUS | Status: DC | PRN

## 2022-07-25 MED ORDER — SODIUM CHLORIDE 0.9% FLUSH: 3.0000 mL | Freq: Two times a day (BID) | INTRAVENOUS | Status: DC

## 2022-07-25 MED ORDER — ONDANSETRON HCL 4 MG/2ML IJ SOLN: 4.0000 mg | Freq: Four times a day (QID) | INTRAMUSCULAR | Status: DC | PRN

## 2022-07-25 MED ORDER — LIDOCAINE HCL (PF) 1 % IJ SOLN
INTRAMUSCULAR | Status: DC | PRN
  Administered 2022-07-25: 2 mL

## 2022-07-25 MED ORDER — MIDAZOLAM HCL 2 MG/2ML IJ SOLN
INTRAMUSCULAR | Status: AC
  Filled 2022-07-25: qty 2

## 2022-07-25 MED ORDER — HEPARIN (PORCINE) IN NACL 1000-0.9 UT/500ML-% IV SOLN
INTRAVENOUS | Status: DC | PRN
  Administered 2022-07-25 (×2): 500 mL

## 2022-07-25 MED ORDER — SODIUM CHLORIDE 0.9 % IV SOLN: 250.0000 mL | INTRAVENOUS | Status: DC | PRN

## 2022-07-25 MED ORDER — FENTANYL CITRATE (PF) 100 MCG/2ML IJ SOLN
INTRAMUSCULAR | Status: AC
  Filled 2022-07-25: qty 2

## 2022-07-25 MED ORDER — SODIUM CHLORIDE 0.9 % WEIGHT BASED INFUSION: 1.0000 mL/kg/h | INTRAVENOUS | Status: DC

## 2022-07-25 MED ORDER — HEPARIN SODIUM (PORCINE) 1000 UNIT/ML IJ SOLN
INTRAMUSCULAR | Status: DC | PRN
  Administered 2022-07-25: 6000 [IU] via INTRAVENOUS

## 2022-07-25 MED ORDER — HYDRALAZINE HCL 20 MG/ML IJ SOLN: 10.0000 mg | INTRAMUSCULAR | Status: DC | PRN

## 2022-07-25 MED ORDER — ACETAMINOPHEN 325 MG PO TABS: 650.0000 mg | ORAL_TABLET | ORAL | Status: DC | PRN

## 2022-07-25 MED ORDER — VERAPAMIL HCL 2.5 MG/ML IV SOLN
INTRAVENOUS | Status: DC | PRN
  Administered 2022-07-25: 10 mL via INTRA_ARTERIAL

## 2022-07-25 MED ORDER — SODIUM CHLORIDE 0.9 % WEIGHT BASED INFUSION
1.0000 mL/kg/h | INTRAVENOUS | Status: DC
  Administered 2022-07-25: 1 mL/kg/h via INTRAVENOUS

## 2022-07-25 MED ORDER — VERAPAMIL HCL 2.5 MG/ML IV SOLN
INTRAVENOUS | Status: AC
  Filled 2022-07-25: qty 2

## 2022-07-25 MED ORDER — SODIUM CHLORIDE 0.9 % WEIGHT BASED INFUSION
3.0000 mL/kg/h | INTRAVENOUS | Status: AC
  Administered 2022-07-25: 3 mL/kg/h via INTRAVENOUS

## 2022-07-25 SURGICAL SUPPLY — 12 items
CATH INFINITI 5 FR JL3.5 (CATHETERS) IMPLANT
CATH INFINITI 5FR ANG PIGTAIL (CATHETERS) IMPLANT
CATH INFINITI AMBI 5FR TG (CATHETERS) IMPLANT
DEVICE RAD COMP TR BAND LRG (VASCULAR PRODUCTS) IMPLANT
GLIDESHEATH SLEND SS 6F .021 (SHEATH) IMPLANT
GUIDEWIRE INQWIRE 1.5J.035X260 (WIRE) IMPLANT
INQWIRE 1.5J .035X260CM (WIRE) ×1
KIT HEART LEFT (KITS) ×1 IMPLANT
PACK CARDIAC CATHETERIZATION (CUSTOM PROCEDURE TRAY) ×1 IMPLANT
SHEATH PROBE COVER 6X72 (BAG) IMPLANT
TRANSDUCER W/STOPCOCK (MISCELLANEOUS) ×1 IMPLANT
TUBING CIL FLEX 10 FLL-RA (TUBING) ×1 IMPLANT

## 2022-07-25 NOTE — Progress Notes (Signed)
Patient c/o feeling ligtheaded. Face flushed, VS stable. Reclined pt to about 20 degress. Denies SOB, N/V, and pain.

## 2022-07-25 NOTE — Progress Notes (Signed)
Patient ambulated without difficulty, SOB or lightheadedness. Stated he just feels tried and ready to go home.

## 2022-07-25 NOTE — Interval H&P Note (Signed)
History and Physical Interval Note:  07/25/2022 5:10 PM  Seth Meadows  has presented today for surgery, with the diagnosis of calcification.  The various methods of treatment have been discussed with the patient and family. After consideration of risks, benefits and other options for treatment, the patient has consented to  Procedure(s): LEFT HEART CATH AND CORONARY ANGIOGRAPHY (N/A)  PERCUTANEOUS CORONARY INTERVENTION  as a surgical intervention.  The patient's history has been reviewed, patient examined, no change in status, stable for surgery.  I have reviewed the patient's chart and labs.  Questions were answered to the patient's satisfaction.    Cath Lab Visit (complete for each Cath Lab visit)  Clinical Evaluation Leading to the Procedure:   ACS: No.  Non-ACS:    Anginal Classification: CCS III  Anti-ischemic medical therapy: Minimal Therapy (1 class of medications)  Non-Invasive Test Results: High-risk stress test findings: cardiac mortality >3%/year  Prior CABG: No previous CABG     Seth Meadows

## 2022-07-25 NOTE — Brief Op Note (Signed)
07/25/2022  6:01 PM  SURGEON:  Surgeon(s) and Role:    * Marykay Lex, MD - Primary   PROCEDURE:  Procedure(s): LEFT HEART CATH AND CORONARY ANGIOGRAPHY (N/A)  PATIENT:  Seth Meadows  62 y.o. male referred by Dr. Azucena Cecil for cardiac catheterization based on abnormal coronary CTA.  He has had intermittent episodes of exertional dyspnea and chest pain as well as chest pain bending over.  CT scan suggested distal LAD FFR positive disease with heavy calcification.  PRE-OPERATIVE DIAGNOSIS: Coronary calcification-FFR positive distal LAD  POST-OPERATIVE DIAGNOSIS:   Similar findings outpatient CT scan with several 30 to 40% stenoses in the mid to distal LAD with most significant lesion being an 80% lesion just prior to the apex not amenable to PCI.  The entire LAD is heavily calcified with mild disease   PROCEDURE PERFORMED: Time Out: Verified patient identification, verified procedure, site/side was marked, verified correct patient position, special equipment/implants available, medications/allergies/relevent history reviewed, required imaging and test results available. Performed.  Access:  RIGHT Radial Artery: 6 Fr sheath -- Seldinger technique using Micropuncture Kit -- Direct ultrasound guidance used.  Permanent image obtained and placed on chart. -- 10 mL radial cocktail IA; 6000 Units IV Heparin  Left Heart Catheterization: 5Fr Catheters advanced or exchanged over a J-wire under direct fluoroscopic guidance into the ascending aorta; TIG 4.0 catheter advanced first.  * LV Hemodynamics (no LV Gram): Angled pigtail catheter * Left Coronary Artery Cineangiography: JL 3.5 catheter  * Right Coronary Artery  Cineangiography: TIG 4.0 catheter   Review of initial angiography revealed: Similar findings to what was seen on CT scan with diffuse mild to moderate disease throughout the LAD with heavy calcification.  Most prominent lesion would be a 70 to 80% lesion in the distal LAD, likely  too far distal for PCI.  CathWorks FFR assessment of the LAD showed distal LAD FFR 0.93-not significant.  It did not calibrate out to the distal apical lesion as it was too far distal.  Preparations are made for outpatient medical management  Upon completion of Angiogaphy, the catheter was removed completely out of the body over a wire, without complication.  Radial sheath removed in the Cardiac Catheterization lab with TR Band placed for hemostasis.  TR Band: 1750  Hours; 11 mL air; reverse Barbeau  MEDICATIONS SQ Lidocaine 4 mL Radial Cocktail: 3 mg Verapmil in 10 mL NS Heparin: 6000 units    EBL:  <20 mL PATIENT DISPOSITION:  PACU - hemodynamically stable.  COUNTS:  YES  DICTATION: .Note written in EPIC  PLAN OF CARE: Discharge to home after PACU Follow-up with Dr. Azucena Cecil for titration of medical therapy for CAD and likely microvascular disease.  Consider uptitrate of amlodipine and potentially using Ranexa.   [name], MD

## 2022-07-25 NOTE — Discharge Instructions (Signed)

## 2022-07-28 ENCOUNTER — Encounter (HOSPITAL_COMMUNITY): Payer: Self-pay | Admitting: Cardiology

## 2022-08-04 ENCOUNTER — Encounter: Payer: Self-pay | Admitting: Cardiology

## 2022-08-04 ENCOUNTER — Ambulatory Visit: Payer: BC Managed Care – PPO | Attending: Cardiology | Admitting: Cardiology

## 2022-08-04 VITALS — BP 104/78 | HR 87 | Ht 73.0 in | Wt 246.0 lb

## 2022-08-04 DIAGNOSIS — E7800 Pure hypercholesterolemia, unspecified: Secondary | ICD-10-CM | POA: Diagnosis not present

## 2022-08-04 DIAGNOSIS — I2510 Atherosclerotic heart disease of native coronary artery without angina pectoris: Secondary | ICD-10-CM

## 2022-08-04 DIAGNOSIS — I10 Essential (primary) hypertension: Secondary | ICD-10-CM

## 2022-08-04 NOTE — Patient Instructions (Signed)
Medication Instructions:   STOP hydrochlorothiazide  *If you need a refill on your cardiac medications before your next appointment, please call your pharmacy*   Lab Work:  None Ordered  If you have labs (blood work) drawn today and your tests are completely normal, you will receive your results only by: MyChart Message (if you have MyChart) OR A paper copy in the mail If you have any lab test that is abnormal or we need to change your treatment, we will call you to review the results.   Testing/Procedures:  None Ordered   Follow-Up: At [facility], you and your health needs are our priority.  As part of our continuing mission to provide you with exceptional heart care, we have created designated Provider Care Teams.  These Care Teams include your primary Cardiologist (physician) and Advanced Practice Providers (APPs -  Physician Assistants and Nurse Practitioners) who all work together to provide you with the care you need, when you need it.  We recommend signing up for the patient portal called "MyChart".  Sign up information is provided on this After Visit Summary.  MyChart is used to connect with patients for Virtual Visits (Telemedicine).  Patients are able to view lab/test results, encounter notes, upcoming appointments, etc.  Non-urgent messages can be sent to your provider as well.   To learn more about what you can do with MyChart, go to [email or web address].    Your next appointment:   4 month(s)  Provider:   Debbe Odea, MD  ONLY

## 2022-08-04 NOTE — Progress Notes (Signed)
Cardiology Office Note:    Date:  08/04/2022   ID:  Seth Meadows, DOB 1960/03/30, MRN [id number]  PCP:  Alba Cory, MD  Cardiologist:  Debbe Odea, MD  Electrophysiologist:  None   Referring MD: Alba Cory, MD   Chief Complaint  Patient presents with   Follow-up    Patient reports feeling fatigued/weak since cath.  BP 104/78 in office check today and he is concerned with decreasing bp.    History of Present Illness:    Seth Meadows is a 62 y.o. male with a hx of hypertension, CAD (left heart cath 07/2022 -50% proximal LAD, 80% distal LAD, minimal diffuse RCA and left circumflex disease), hypogonadism on testosterone, OSA on CPAP who presents for follow-up.  Previously seen due to chest pain and coronary calcifications on CTA.  Underwent left heart cath showing moderate disease diffusely in LAD, 80% distal LAD, medical management advised.  Started on aspirin, Crestor 20 mg daily.  States eating healthier, less sugary foods, has lost some weight since having left heart cath performed.  Blood pressures have been running low at home, occasional systolics in the 90s.  Compliant with medications as prescribed, denies any chest pain.  Prior notes CT cardiac score 06/2022, three-vessel coronary calcification, coronary calcium score 3449, 99th percentile, Valsartan caused loose stools.   Past Medical History:  Diagnosis Date   Allergic rhinitis    BPH with obstruction/lower urinary tract symptoms    Cutaneous eruption    Earache    Epididymitis, left    Hyperglycemia    Hypertension    Hypogonadism in male    Kidney function test abnormal    Overweight    Scrotal pain    Sleep apnea     Past Surgical History:  Procedure Laterality Date   COLONOSCOPY WITH PROPOFOL N/A 07/04/2022   Procedure: COLONOSCOPY WITH PROPOFOL;  Surgeon: Wyline Mood, MD;  Location: [facility] ENDOSCOPY;  Service: Gastroenterology;  Laterality: N/A;   EYELID LACERATION REPAIR  01/2017   cut the  the eye lid to drain the infection out   LEFT HEART CATH AND CORONARY ANGIOGRAPHY N/A 07/25/2022   Procedure: LEFT HEART CATH AND CORONARY ANGIOGRAPHY;  Surgeon: Marykay Lex, MD;  Location: [facility] INVASIVE CV LAB;  Service: Cardiovascular;  Laterality: N/A;   WISDOM TOOTH EXTRACTION      Current Medications: Current Meds  Medication Sig   amLODipine-benazepril (LOTREL) 5-20 MG capsule Take 1 capsule by mouth every evening.   aspirin EC 81 MG tablet Take 1 tablet (81 mg total) by mouth daily. Swallow whole.   Cholecalciferol (VITAMIN D3) 50 MCG (2000 UT) capsule Take 1 capsule (2,000 Units total) by mouth daily. (Patient taking differently: Take 5,000 Units by mouth daily.)   Cyanocobalamin (VITAMIN B-12) 3000 MCG SUBL Place 3,000 mcg under the tongue every other day.   diclofenac Sodium (VOLTAREN) 1 % GEL APPLY 2  GRAMS TOPICALLY 4 TIMES DAILY (Patient taking differently: Apply 2 g topically daily as needed (pain).)   Multiple Vitamins-Minerals (MULTIVITAMIN WITH MINERALS) tablet Take 1 tablet by mouth daily.   Papav-Phentolamine-Alprostadil 150-5-50 MG-MG-MCG SOLR 1 each by Intracavernosal route daily as needed (ED). Trimix   rosuvastatin (CRESTOR) 20 MG tablet Take 1 tablet (20 mg total) by mouth daily.   SYRINGE-NEEDLE, DISP, 3 ML (MONOJECT SAFETY SYRINGE/SHIELD) 23G X 1" 3 ML MISC Inject 1mL intramuscular every 10days   tadalafil (CIALIS) 20 MG tablet Take 20 mg by mouth daily as needed for erectile dysfunction.   testosterone cypionate (DEPOTESTOSTERONE  CYPIONATE) 200 MG/ML injection Inject 0.3 mLs into the muscle once a week.   [DISCONTINUED] hydrochlorothiazide (HYDRODIURIL) 12.5 MG tablet TAKE 1 TABLET BY MOUTH IN THE MORNING     Allergies:   Valsartan   Social History   Socioeconomic History   Marital status: Married    Spouse name: July    Number of children: 3   Years of education: Not on file   Highest education level: Bachelor's degree (e.g., BA, AB, BS)  Occupational  History   Occupation: Acupuncturist   Tobacco Use   Smoking status: Never   Smokeless tobacco: Never  Vaping Use   Vaping status: Never Used  Substance and Sexual Activity   Alcohol use: No    Comment: occasional   Drug use: No   Sexual activity: Yes    Partners: Female  Other Topics Concern   Not on file  Social History Narrative   Married, has three children,    Twin daughters still in middle school   Son is going to school at [employer]    He also has a step son    Social Determinants of Health   Financial Resource Strain: Low Risk  (06/14/2022)   Overall Financial Resource Strain (CARDIA)    Difficulty of Paying Living Expenses: Not hard at all  Food Insecurity: No Food Insecurity (06/14/2022)   Hunger Vital Sign    Worried About Running Out of Food in the Last Year: Never true    Ran Out of Food in the Last Year: Never true  Transportation Needs: No Transportation Needs (06/14/2022)   PRAPARE - Administrator, Civil Service (Medical): No    Lack of Transportation (Non-Medical): No  Physical Activity: Sufficiently Active (06/14/2022)   Exercise Vital Sign    Days of Exercise per Week: 5 days    Minutes of Exercise per Session: 40 min  Stress: No Stress Concern Present (06/14/2022)   [employer] of Occupational Health - Occupational Stress Questionnaire    Feeling of Stress : Not at all  Social Connections: Moderately Integrated (06/14/2022)   Social Connection and Isolation Panel [NHANES]    Frequency of Communication with Friends and Family: More than three times a week    Frequency of Social Gatherings with Friends and Family: More than three times a week    Attends Religious Services: Never    Database administrator or Organizations: Yes    Attends Engineer, structural: More than 4 times per year    Marital Status: Married     Family History: The patient's family history includes Diabetes in his brother and mother; Hypertension in his  brother, father, and mother; Prostate cancer in his father; Skin cancer in his sister; Thyroid disease in his father. There is no history of Kidney disease or Bladder Cancer.  ROS:   Please see the history of present illness.     All other systems reviewed and are negative.  EKGs/Labs/Other Studies Reviewed:    The following studies were reviewed today:   EKG Interpretation Date/Time:  Friday August 04 2022 10:41:54 EDT Ventricular Rate:  87 PR Interval:  174 QRS Duration:  96 QT Interval:  348 QTC Calculation: 418 R Axis:   -24  Text Interpretation: Normal sinus rhythm Possible Left atrial enlargement Nonspecific T wave abnormality Confirmed by Debbe Odea ([id number]) on 08/04/2022 10:47:22 AM    Recent Labs: 06/14/2022: ALT 29; TSH 2.48 07/24/2022: BUN 29; Creatinine, Ser 1.45; Hemoglobin 17.1; Platelets 195;  Potassium 3.6; Sodium 135  Recent Lipid Panel    Component Value Date/Time   CHOL 144 06/14/2022 0944   CHOL 158 11/19/2018 0803   TRIG 117 06/14/2022 0944   HDL 41 06/14/2022 0944   HDL 35 (L) 11/19/2018 0803   CHOLHDL 3.5 06/14/2022 0944   VLDL 12 07/10/2016 0835   LDLCALC 82 06/14/2022 0944    Physical Exam:    VS:  BP 104/78 (BP Location: Left Arm, Patient Position: Sitting, Cuff Size: Large)   Pulse 87   Ht 6\' 1"  (1.854 m)   Wt 246 lb (111.6 kg)   SpO2 98%   BMI 32.46 kg/m     Wt Readings from Last 3 Encounters:  08/04/22 246 lb (111.6 kg)  07/25/22 250 lb (113.4 kg)  07/12/22 263 lb 3.2 oz (119.4 kg)     GEN:  Well nourished, well developed in no acute distress HEENT: Normal NECK: No JVD; No carotid bruits CARDIAC: RRR, no murmurs, rubs, gallops RESPIRATORY:  Clear to auscultation without rales, wheezing or rhonchi  ABDOMEN: Soft, non-tender, non-distended MUSCULOSKELETAL:  No edema; No deformity  SKIN: Warm and dry NEUROLOGIC:  Alert and oriented x 3 PSYCHIATRIC:  Normal affect   ASSESSMENT:    1. Coronary artery disease involving native  coronary artery of native heart, unspecified whether angina present   2. Primary hypertension   3. Pure hypercholesterolemia    PLAN:    In order of problems listed above:  CAD, 50% mid LAD, 80% distal LAD.  Minimal diffuse disease in RCA and left circumflex.  Continue aspirin 81 mg, Crestor 20 mg daily.  Patient currently denies chest pain.  Consider Ranexa if angina occurs. Hypertension, BP low normal, complains of fatigue.  Stop HCTZ, continue Norvasc 5, benazepril 20. Hyperlipidemia, continue Crestor to 20 mg daily.  Follow-up in 4 to 5 months   Medication Adjustments/Labs and Tests Ordered: Current medicines are reviewed at length with the patient today.  Concerns regarding medicines are outlined above.  Orders Placed This Encounter  Procedures   EKG 12-Lead   No orders of the defined types were placed in this encounter.   Patient Instructions  Medication Instructions:   STOP hydrochlorothiazide  *If you need a refill on your cardiac medications before your next appointment, please call your pharmacy*   Lab Work:  None Ordered  If you have labs (blood work) drawn today and your tests are completely normal, you will receive your results only by: MyChart Message (if you have MyChart) OR A paper copy in the mail If you have any lab test that is abnormal or we need to change your treatment, we will call you to review the results.   Testing/Procedures:  None Ordered   Follow-Up: At [facility], you and your health needs are our priority.  As part of our continuing mission to provide you with exceptional heart care, we have created designated Provider Care Teams.  These Care Teams include your primary Cardiologist (physician) and Advanced Practice Providers (APPs -  Physician Assistants and Nurse Practitioners) who all work together to provide you with the care you need, when you need it.  We recommend signing up for the patient portal called "MyChart".   Sign up information is provided on this After Visit Summary.  MyChart is used to connect with patients for Virtual Visits (Telemedicine).  Patients are able to view lab/test results, encounter notes, upcoming appointments, etc.  Non-urgent messages can be sent to your provider as well.  To learn more about what you can do with MyChart, go to [email or web address].    Your next appointment:   4 month(s)  Provider:   Debbe Odea, MD  ONLY     Signed, Debbe Odea, MD  08/04/2022 11:28 AM    [facility]

## 2022-08-09 ENCOUNTER — Other Ambulatory Visit: Payer: Self-pay | Admitting: Family Medicine

## 2022-08-09 DIAGNOSIS — I10 Essential (primary) hypertension: Secondary | ICD-10-CM

## 2022-08-13 ENCOUNTER — Other Ambulatory Visit: Payer: Self-pay | Admitting: Family Medicine

## 2022-08-13 DIAGNOSIS — I10 Essential (primary) hypertension: Secondary | ICD-10-CM

## 2022-08-14 NOTE — Progress Notes (Unsigned)
Name: Seth Meadows   MRN: [id number]    DOB: [birth year]   Date:08/15/2022       Progress Note  Subjective  Chief Complaint  Follow Up  HPI  OSA: he is compliant with CPAP machine , he sleeps 6-7 hours per night, he wakes up feeling rested , still falls asleep if sitting around at home   He has noticed pain on right index DIP over the 9 months  initially topical medication helped but no longer making a difference , discussed seeing Ortho  Numbness and tingling right thigh, from right outer hip to anterior right knee, no redness , pain is worse when standing up, pain right now is 4/10, occasionally has back pain. Explained likely from his spine causing radiculitis, we will give him NSAID's for now and refer to ortho   Hypogonadism:  seeing Urologist Dr. Britt Boozer, using Trimix injections and taking testosterone 75 mg weekly, his HCT continues to be elevated, unchanged   Dyslipidemia: he is back on statin therapy no side effects, he is now on higher dose of Crestor, it caused some diarrhea, advised to take it at night    HTN: he is on Lotrel 5/20, he is now off hydrochlorothiazide due to low bp with weight loss and eating healthier , occasionally has dizziness when stooping down and getting up quickly, denies chest pain or sob .BP is still towards low end of normal and since he has CAD we will stop norvasc , continue benazepril and add carvedilol, return in 2 weeks for bp check with [facility]MA  CAD : he has a long history of dizziness  when going up the stairs, he can go to the gym and work out for over 30 minutes without problems. He never had a syncopal episode, usually lasts a few seconds and resolves with rest. He has seen cardiologist - he had stress test and echo done in 2015, he states started after he started taking bp medication, usually after he goes up the stairs and gets to the regular flat path when he gets dizzy. It is not associated with chest pain, diaphoresis or SOB. We checked cardiac calcium  score that was positive followed by cardiac cath that showed - CAD (left heart cath 07/2022 -50% proximal LAD, 80% distal LAD, minimal diffuse RCA and left circumflex disease , he is on medical management . Discussed consider seeing cardiovascular surgeon .   Vitamin D deficiency and B12 deficiency: taking supplementation and last levels back to normal   Obesity: his weight was up to 273 lbs in May 2024 , he changed his diet, smaller portions and going to the gym most days of the week and weight is down to 241.3 lbs.   Patient Active Problem List   Diagnosis Date Noted   Adenomatous polyp of colon 07/04/2022   Vitamin B12 deficiency 11/15/2021   Elevated red blood cell count 11/15/2021   Pinched nerve in shoulder, left 10/23/2019   OSA on CPAP 02/28/2016   Vitamin D deficiency 08/31/2015   Encounter for screening colonoscopy 03/16/2015   Dyslipidemia 03/01/2015   Erectile dysfunction of organic origin 10/29/2014   Arthritis of lumbar spine 10/22/2014   Intermittent low back pain 09/21/2014   Hypogonadism in male 07/30/2014   Essential hypertension     Past Surgical History:  Procedure Laterality Date   COLONOSCOPY WITH PROPOFOL N/A 07/04/2022   Procedure: COLONOSCOPY WITH PROPOFOL;  Surgeon: Wyline Mood, MD;  Location: [facility] ENDOSCOPY;  Service: Gastroenterology;  Laterality: N/A;  EYELID LACERATION REPAIR  01/2017   cut the the eye lid to drain the infection out   LEFT HEART CATH AND CORONARY ANGIOGRAPHY N/A 07/25/2022   Procedure: LEFT HEART CATH AND CORONARY ANGIOGRAPHY;  Surgeon: Marykay Lex, MD;  Location: [facility] INVASIVE CV LAB;  Service: Cardiovascular;  Laterality: N/A;   WISDOM TOOTH EXTRACTION      Family History  Problem Relation Age of Onset   Hypertension Mother    Diabetes Mother    Hypertension Father    Prostate cancer Father    Thyroid disease Father    Skin cancer Sister    Diabetes Brother    Hypertension Brother    Kidney disease Neg Hx    Bladder Cancer  Neg Hx     Social History   Tobacco Use   Smoking status: Never   Smokeless tobacco: Never  Substance Use Topics   Alcohol use: No    Comment: occasional     Current Outpatient Medications:    amLODipine-benazepril (LOTREL) 5-20 MG capsule, Take 1 capsule by mouth every evening., Disp: 90 capsule, Rfl: 1   aspirin EC 81 MG tablet, Take 1 tablet (81 mg total) by mouth daily. Swallow whole., Disp: 90 tablet, Rfl: 3   Cholecalciferol (VITAMIN D3) 50 MCG (2000 UT) capsule, Take 1 capsule (2,000 Units total) by mouth daily. (Patient taking differently: Take 5,000 Units by mouth daily.), Disp: 100 capsule, Rfl: 1   Cyanocobalamin (VITAMIN B-12) 3000 MCG SUBL, Place 3,000 mcg under the tongue every other day., Disp: , Rfl:    diclofenac Sodium (VOLTAREN) 1 % GEL, APPLY 2  GRAMS TOPICALLY 4 TIMES DAILY (Patient taking differently: Apply 2 g topically daily as needed (pain).), Disp: 100 g, Rfl: 0   Multiple Vitamins-Minerals (MULTIVITAMIN WITH MINERALS) tablet, Take 1 tablet by mouth daily., Disp: , Rfl:    Papav-Phentolamine-Alprostadil 150-5-50 MG-MG-MCG SOLR, 1 each by Intracavernosal route daily as needed (ED). Trimix, Disp: , Rfl:    rosuvastatin (CRESTOR) 20 MG tablet, Take 1 tablet (20 mg total) by mouth daily., Disp: 90 tablet, Rfl: 0   SYRINGE-NEEDLE, DISP, 3 ML (MONOJECT SAFETY SYRINGE/SHIELD) 23G X 1" 3 ML MISC, Inject 1mL intramuscular every 10days, Disp: , Rfl:    tadalafil (CIALIS) 20 MG tablet, Take 20 mg by mouth daily as needed for erectile dysfunction., Disp: , Rfl:    testosterone cypionate (DEPOTESTOSTERONE CYPIONATE) 200 MG/ML injection, Inject 0.3 mLs into the muscle once a week., Disp: , Rfl:   Allergies  Allergen Reactions   Valsartan Other (See Comments)    diarrhea    I personally reviewed active problem list, medication list, allergies, family history, social history, health maintenance with the patient/caregiver today.   ROS  Ten systems reviewed and is  negative except as mentioned in HPI    Objective  Vitals:   08/15/22 0913  BP: 112/80  Pulse: 92  Resp: 16  Temp: 97.7 F (36.5 C)  TempSrc: Oral  SpO2: 98%  Weight: 241 lb 4.8 oz (109.5 kg)  Height: 6\' 1"  (1.854 m)    Body mass index is 31.84 kg/m.  Physical Exam  Constitutional: Patient appears well-developed and well-nourished. Obese  No distress.  HEENT: head atraumatic, normocephalic, pupils equal and reactive to light, neck supple Cardiovascular: Normal rate, regular rhythm and normal heart sounds.  No murmur heard. No BLE edema. Pulmonary/Chest: Effort normal and breath sounds normal. No respiratory distress. Abdominal: Soft.  There is no tenderness. Psychiatric: Patient has a normal mood and  affect. behavior is normal. Judgment and thought content normal.   PHQ2/9:    08/15/2022    9:14 AM 06/14/2022    9:17 AM 11/15/2021    8:51 AM 06/02/2021   10:17 AM 05/16/2021    3:40 PM  Depression screen PHQ 2/9  Decreased Interest  0 0 0 0  Down, Depressed, Hopeless 0 0 0 0 0  PHQ - 2 Score 0 0 0 0 0  Altered sleeping 0 0 0 0 0  Tired, decreased energy 0 0 0 0 0  Change in appetite 0 0 0 0 0  Feeling bad or failure about yourself  0 0 0 0 0  Trouble concentrating 0 0 0 0 0  Moving slowly or fidgety/restless 0 0 0 0 0  Suicidal thoughts 0 0 0 0 0  PHQ-9 Score 0 0 0 0 0    phq 9 is negative   Fall Risk:    08/15/2022    9:14 AM 06/14/2022    9:17 AM 11/15/2021    8:51 AM 06/02/2021   10:17 AM 05/16/2021    3:40 PM  Fall Risk   Falls in the past year? 0 1 0 0 0  Number falls in past yr:  0 0 0 0  Injury with Fall?  0 0 0 0  Risk for fall due to : No Fall Risks History of fall(s) No Fall Risks No Fall Risks No Fall Risks  Follow up Falls prevention discussed Falls prevention discussed;Education provided;Falls evaluation completed Falls prevention discussed Falls prevention discussed Falls prevention discussed      Functional Status Survey: Is the patient  deaf or have difficulty hearing?: No Does the patient have difficulty seeing, even when wearing glasses/contacts?: No Does the patient have difficulty concentrating, remembering, or making decisions?: No Does the patient have difficulty walking or climbing stairs?: No Does the patient have difficulty dressing or bathing?: No Does the patient have difficulty doing errands alone such as visiting a doctor's office or shopping?: No    Assessment & Plan  1. Essential hypertension  - carvedilol (COREG) 6.25 MG tablet; Take 1 tablet (6.25 mg total) by mouth 2 (two) times daily with a meal.  Dispense: 60 tablet; Refill: 0 - benazepril (LOTENSIN) 20 MG tablet; Take 1 tablet (20 mg total) by mouth daily.  Dispense: 30 tablet; Refill: 0  2. Vitamin D deficiency  Continue supplementation   3. Dyslipidemia  On higher dose statin therapy   4. OSA on CPAP  Compliant   5. Coronary artery disease involving native coronary artery of native heart without angina pectoris  - carvedilol (COREG) 6.25 MG tablet; Take 1 tablet (6.25 mg total) by mouth 2 (two) times daily with a meal.  Dispense: 60 tablet; Refill: 0  6. Intermittent low back pain  - Ambulatory referral to Orthopedic Surgery  7. Radiculitis  - Ambulatory referral to Orthopedic Surgery  8. Finger pain, right  - Ambulatory referral to Orthopedic Surgery  Celebrex bid prn

## 2022-08-15 ENCOUNTER — Ambulatory Visit (INDEPENDENT_AMBULATORY_CARE_PROVIDER_SITE_OTHER): Payer: BC Managed Care – PPO | Admitting: Family Medicine

## 2022-08-15 ENCOUNTER — Encounter: Payer: Self-pay | Admitting: Family Medicine

## 2022-08-15 VITALS — BP 112/80 | HR 92 | Temp 97.7°F | Resp 16 | Ht 73.0 in | Wt 241.3 lb

## 2022-08-15 DIAGNOSIS — I10 Essential (primary) hypertension: Secondary | ICD-10-CM | POA: Diagnosis not present

## 2022-08-15 DIAGNOSIS — E559 Vitamin D deficiency, unspecified: Secondary | ICD-10-CM | POA: Diagnosis not present

## 2022-08-15 DIAGNOSIS — I2510 Atherosclerotic heart disease of native coronary artery without angina pectoris: Secondary | ICD-10-CM

## 2022-08-15 DIAGNOSIS — G4733 Obstructive sleep apnea (adult) (pediatric): Secondary | ICD-10-CM

## 2022-08-15 DIAGNOSIS — M5410 Radiculopathy, site unspecified: Secondary | ICD-10-CM

## 2022-08-15 DIAGNOSIS — M545 Low back pain, unspecified: Secondary | ICD-10-CM

## 2022-08-15 DIAGNOSIS — E785 Hyperlipidemia, unspecified: Secondary | ICD-10-CM

## 2022-08-15 DIAGNOSIS — M79644 Pain in right finger(s): Secondary | ICD-10-CM

## 2022-08-15 MED ORDER — BENAZEPRIL HCL 20 MG PO TABS
20.0000 mg | ORAL_TABLET | Freq: Every day | ORAL | 0 refills | Status: DC
Start: 2022-08-15 — End: 2022-09-13

## 2022-08-15 MED ORDER — CARVEDILOL 6.25 MG PO TABS
6.2500 mg | ORAL_TABLET | Freq: Two times a day (BID) | ORAL | 0 refills | Status: DC
Start: 2022-08-15 — End: 2022-09-13

## 2022-08-15 MED ORDER — CELECOXIB 100 MG PO CAPS
100.0000 mg | ORAL_CAPSULE | Freq: Two times a day (BID) | ORAL | 0 refills | Status: DC
Start: 2022-08-15 — End: 2023-03-16

## 2022-08-29 ENCOUNTER — Ambulatory Visit: Payer: BC Managed Care – PPO

## 2022-08-29 VITALS — BP 130/92

## 2022-08-29 DIAGNOSIS — I10 Essential (primary) hypertension: Secondary | ICD-10-CM

## 2022-08-29 NOTE — Progress Notes (Signed)
Pt notified- verbalized understanding.

## 2022-09-13 ENCOUNTER — Ambulatory Visit: Payer: BC Managed Care – PPO

## 2022-09-13 ENCOUNTER — Telehealth: Payer: Self-pay | Admitting: Family Medicine

## 2022-09-13 ENCOUNTER — Other Ambulatory Visit: Payer: Self-pay

## 2022-09-13 VITALS — BP 118/70

## 2022-09-13 DIAGNOSIS — I2510 Atherosclerotic heart disease of native coronary artery without angina pectoris: Secondary | ICD-10-CM

## 2022-09-13 DIAGNOSIS — Z0130 Encounter for examination of blood pressure without abnormal findings: Secondary | ICD-10-CM

## 2022-09-13 DIAGNOSIS — I10 Essential (primary) hypertension: Secondary | ICD-10-CM

## 2022-09-13 NOTE — Telephone Encounter (Signed)
Pt stopped in for bp check and is also requesting refill on carvedilol and benazepril. Please send to [facility]

## 2022-09-13 NOTE — Progress Notes (Signed)
Patient presented in expressed good health for BP check. Provider notified of result.

## 2022-09-14 MED ORDER — BENAZEPRIL HCL 20 MG PO TABS: 20.0000 mg | ORAL_TABLET | Freq: Every day | ORAL | 0 refills | Status: DC

## 2022-09-14 MED ORDER — CARVEDILOL 6.25 MG PO TABS
6.2500 mg | ORAL_TABLET | Freq: Two times a day (BID) | ORAL | 0 refills | Status: DC
Start: 2022-09-14 — End: 2022-10-12

## 2022-10-11 ENCOUNTER — Ambulatory Visit: Payer: Self-pay | Admitting: *Deleted

## 2022-10-11 NOTE — Telephone Encounter (Signed)
   Reason for Disposition  [1] Systolic BP 90-110 AND [2] taking blood pressure medications AND [3] NOT dizzy, lightheaded or weak  Answer Assessment - Initial Assessment Questions 1. BLOOD PRESSURE: "What is the blood pressure?" "Did you take at least two measurements 5 minutes apart?"     96/62- patient states he has taken BP multiple times during the day and this is the average- it has been as low as 91 systolic  2. ONSET: "When did you take your blood pressure?"     today 3. HOW: "How did you obtain the blood pressure?" (e.g., visiting nurse, automatic home BP monitor)     Automatic cuff 4. HISTORY: "Do you have a history of low blood pressure?" "What is your blood pressure normally?"     Losing weight, recent adjustment 5. MEDICINES: "Are you taking any medications for blood pressure?" If Yes, ask: "Have they been changed recently?"    Yes/ Change in medication  6. PULSE RATE: "Do you know what your pulse rate is?"      64-68 7. OTHER SYMPTOMS: "Have you been sick recently?" "Have you had a recent injury?"     No, some dizziness when goes from sitting to standing  Protocols used: Blood Pressure - Low-A-AH

## 2022-10-11 NOTE — Telephone Encounter (Signed)
  Chief Complaint: low BP Symptoms: low BP readings, some dizziness with change in position  Frequency: several days Pertinent Negatives: Patient denies symptoms now Disposition: [] ED /[] Urgent Care (no appt availability in office) / [x] Appointment(In office/virtual)/ []  [place] [facility]/ [] Home Care/ [] Refused Recommended Disposition /[] [facility]/ []  Follow-up with PCP Additional Notes: Patient has been scheduled for tomorrow- advised ED for increased symptoms, systolic below 90. Patient states he can be contacted by MyChart- if PCP has any suggestion(medication changes) before appointment

## 2022-10-12 ENCOUNTER — Telehealth (INDEPENDENT_AMBULATORY_CARE_PROVIDER_SITE_OTHER): Payer: BC Managed Care – PPO | Admitting: Family Medicine

## 2022-10-12 ENCOUNTER — Encounter: Payer: Self-pay | Admitting: Family Medicine

## 2022-10-12 DIAGNOSIS — I10 Essential (primary) hypertension: Secondary | ICD-10-CM

## 2022-10-12 MED ORDER — CARVEDILOL 3.125 MG PO TABS
3.1250 mg | ORAL_TABLET | Freq: Two times a day (BID) | ORAL | 1 refills | Status: DC
Start: 2022-10-12 — End: 2022-12-08

## 2022-10-12 NOTE — Progress Notes (Signed)
Name: Seth Meadows   MRN: [id number]    DOB: 09-Nov-1960   Date:10/12/2022       Progress Note  Subjective:    Chief Complaint  Chief Complaint  Patient presents with   Low BP    Average 90s/60s   Weight Loss   Dizziness    I connected with  Seth Meadows  on 10/12/22 at 11:40 AM EDT by a video enabled telemedicine application and verified that I am speaking with the correct person using two identifiers.  I discussed the limitations of evaluation and management by telemedicine and the availability of in person appointments. The patient expressed understanding and agreed to proceed. Staff also discussed with the patient that there may be a patient responsible charge related to this service. Patient Location: home Provider Location: out of clinic office/private Additional Individuals present: none  Dizziness   Pt presents for f/up on BP  He's had a lot of health changes over this year with some cardiac screening and work up He had initially gained weight and then with health changes he started to work on diet/lifestyle and has lost 50 lbs He's had some low BP's and slowly decreased meds, but also new heart meds have also been added Currently on carvedilol 6.25 BID and benazepril 20 mg He reports losing more weight, worried about BP getting lower He stood up yesterday and was a little lightheaded and checked BP and it was 91/62 (checked a few more times with average of 95/65) normally bp is 100-120/70's Pulse 60's, he's had some in the 50's    BP Readings from Last 3 Encounters:  09/13/22 118/70  08/29/22 (!) 130/92  08/15/22 112/80   Pulse Readings from Last 3 Encounters:  08/15/22 92  08/04/22 87  07/25/22 77       Patient Active Problem List   Diagnosis Date Noted   Adenomatous polyp of colon 07/04/2022   Vitamin B12 deficiency 11/15/2021   Elevated red blood cell count 11/15/2021   Pinched nerve in shoulder, left 10/23/2019   OSA on CPAP 02/28/2016   Vitamin D  deficiency 08/31/2015   Encounter for screening colonoscopy 03/16/2015   Dyslipidemia 03/01/2015   Erectile dysfunction of organic origin 10/29/2014   Arthritis of lumbar spine 10/22/2014   Intermittent low back pain 09/21/2014   Hypogonadism in male 07/30/2014   Essential hypertension     Social History   Tobacco Use   Smoking status: Never   Smokeless tobacco: Never  Substance Use Topics   Alcohol use: No    Comment: occasional     Current Outpatient Medications:    aspirin EC 81 MG tablet, Take 1 tablet (81 mg total) by mouth daily. Swallow whole., Disp: 90 tablet, Rfl: 3   benazepril (LOTENSIN) 20 MG tablet, Take 1 tablet (20 mg total) by mouth daily., Disp: 90 tablet, Rfl: 0   carvedilol (COREG) 6.25 MG tablet, Take 1 tablet (6.25 mg total) by mouth 2 (two) times daily with a meal., Disp: 180 tablet, Rfl: 0   celecoxib (CELEBREX) 100 MG capsule, Take 1 capsule (100 mg total) by mouth 2 (two) times daily., Disp: 60 capsule, Rfl: 0   Cholecalciferol (VITAMIN D3) 50 MCG (2000 UT) capsule, Take 1 capsule (2,000 Units total) by mouth daily. (Patient taking differently: Take 5,000 Units by mouth daily.), Disp: 100 capsule, Rfl: 1   Cyanocobalamin (VITAMIN B-12) 3000 MCG SUBL, Place 3,000 mcg under the tongue every other day., Disp: , Rfl:    diclofenac Sodium (  VOLTAREN) 1 % GEL, APPLY 2  GRAMS TOPICALLY 4 TIMES DAILY (Patient taking differently: Apply 2 g topically daily as needed (pain).), Disp: 100 g, Rfl: 0   Multiple Vitamins-Minerals (MULTIVITAMIN WITH MINERALS) tablet, Take 1 tablet by mouth daily., Disp: , Rfl:    Papav-Phentolamine-Alprostadil 150-5-50 MG-MG-MCG SOLR, 1 each by Intracavernosal route daily as needed (ED). Trimix, Disp: , Rfl:    rosuvastatin (CRESTOR) 20 MG tablet, Take 1 tablet (20 mg total) by mouth daily., Disp: 90 tablet, Rfl: 0   SYRINGE-NEEDLE, DISP, 3 ML (MONOJECT SAFETY SYRINGE/SHIELD) 23G X 1" 3 ML MISC, Inject 1mL intramuscular every 10days, Disp: ,  Rfl:    tadalafil (CIALIS) 20 MG tablet, Take 20 mg by mouth daily as needed for erectile dysfunction., Disp: , Rfl:    testosterone cypionate (DEPOTESTOSTERONE CYPIONATE) 200 MG/ML injection, Inject 0.3 mLs into the muscle once a week., Disp: , Rfl:   Allergies  Allergen Reactions   Valsartan Other (See Comments)    diarrhea    I personally reviewed active problem list, medication list, allergies, family history, social history, health maintenance, notes from last encounter, lab results, imaging with the patient/caregiver today.   Review of Systems  Constitutional: Negative.   HENT: Negative.    Eyes: Negative.   Respiratory: Negative.    Cardiovascular: Negative.   Gastrointestinal: Negative.   Endocrine: Negative.   Genitourinary: Negative.   Musculoskeletal: Negative.   Skin: Negative.   Allergic/Immunologic: Negative.   Neurological:  Positive for dizziness.  Hematological: Negative.   Psychiatric/Behavioral: Negative.    All other systems reviewed and are negative.     Objective:   Virtual encounter, vitals limited, only able to obtain the following There were no vitals filed for this visit. There is no height or weight on file to calculate BMI. Nursing Note and Vital Signs reviewed.  Physical Exam Vitals and nursing note reviewed.  Constitutional:      General: He is not in acute distress.    Appearance: He is not ill-appearing, toxic-appearing or diaphoretic.     Comments: Well appearing male  Neurological:     Mental Status: He is alert.     PE limited by virtual encounter  No results found for this or any previous visit (from the past 72 hour(s)).  Assessment and Plan:   1. Essential hypertension BP a little soft on his current meds and with diet/lifestyle changes and actively trying to loose weight One episode of lightheadedness yesterday, no syncope, BP checked at the time of sx was 90/60's HR reported in 60's and 50's Will reduce his carvedilol  rx dose first (since some minor bradycardia and this was a newer med) and he will continue benazapril for now. Close BP f/up in office - he reports he has an appt within the month. We can reduce his benazapril dose as well if still having soft BP, for now he can continue his 20 mg dose daily - carvedilol (COREG) 3.125 MG tablet; Take 1 tablet (3.125 mg total) by mouth 2 (two) times daily with a meal.  Dispense: 60 tablet; Refill: 1    -Red flags and when to present for emergency care or RTC including fever >101.68F, chest pain, shortness of breath, new/worsening/un-resolving symptoms, reviewed with patient at time of visit. Follow up and care instructions discussed and provided in AVS. - I discussed the assessment and treatment plan with the patient. The patient was provided an opportunity to ask questions and all were answered. The patient agreed with  the plan and demonstrated an understanding of the instructions.  I provided 15 minutes of non-face-to-face time during this encounter.  Danelle Berry, PA-C 10/12/22 11:50 AM

## 2022-11-15 ENCOUNTER — Ambulatory Visit: Payer: BC Managed Care – PPO | Admitting: Family Medicine

## 2022-11-24 NOTE — Progress Notes (Deleted)
Name: Seth Meadows   MRN: [id number]    DOB: 10-22-1960   Date:11/24/2022       Progress Note  Subjective  Chief Complaint  Follow Up  HPI  OSA: he is compliant with CPAP machine , he sleeps 6-7 hours per night, he wakes up feeling rested , still falls asleep if sitting around at home   He has noticed pain on right index DIP over the 9 months  initially topical medication helped but no longer making a difference , discussed seeing Ortho  Numbness and tingling right thigh, from right outer hip to anterior right knee, no redness , pain is worse when standing up, pain right now is 4/10, occasionally has back pain. Explained likely from his spine causing radiculitis, we will give him NSAID's for now and refer to ortho   Hypogonadism:  seeing Urologist Dr. Britt Boozer, using Trimix injections and taking testosterone 75 mg weekly, his HCT continues to be elevated, unchanged   Dyslipidemia: he is back on statin therapy no side effects, he is now on higher dose of Crestor, it caused some diarrhea, advised to take it at night    HTN: he is on Lotrel 5/20, he is now off hydrochlorothiazide due to low bp with weight loss and eating healthier , occasionally has dizziness when stooping down and getting up quickly, denies chest pain or sob .BP is still towards low end of normal and since he has CAD we will stop norvasc , continue benazepril and add carvedilol, return in 2 weeks for bp check with CMA  CAD : he has a long history of dizziness  when going up the stairs, he can go to the gym and work out for over 30 minutes without problems. He never had a syncopal episode, usually lasts a few seconds and resolves with rest. He has seen cardiologist - he had stress test and echo done in 2015, he states started after he started taking bp medication, usually after he goes up the stairs and gets to the regular flat path when he gets dizzy. It is not associated with chest pain, diaphoresis or SOB. We checked cardiac calcium  score that was positive followed by cardiac cath that showed - CAD (left heart cath 07/2022 -50% proximal LAD, 80% distal LAD, minimal diffuse RCA and left circumflex disease , he is on medical management . Discussed consider seeing cardiovascular surgeon .   Vitamin D deficiency and B12 deficiency: taking supplementation and last levels back to normal   Obesity: his weight was up to 273 lbs in May 2024 , he changed his diet, smaller portions and going to the gym most days of the week and weight is down to 241.3 lbs.   Patient Active Problem List   Diagnosis Date Noted   Adenomatous polyp of colon 07/04/2022   Vitamin B12 deficiency 11/15/2021   Elevated red blood cell count 11/15/2021   Pinched nerve in shoulder, left 10/23/2019   OSA on CPAP 02/28/2016   Vitamin D deficiency 08/31/2015   Encounter for screening colonoscopy 03/16/2015   Dyslipidemia 03/01/2015   Erectile dysfunction of organic origin 10/29/2014   Arthritis of lumbar spine 10/22/2014   Intermittent low back pain 09/21/2014   Hypogonadism in male 07/30/2014   Essential hypertension     Past Surgical History:  Procedure Laterality Date   COLONOSCOPY WITH PROPOFOL N/A 07/04/2022   Procedure: COLONOSCOPY WITH PROPOFOL;  Surgeon: Wyline Mood, MD;  Location: [facility] ENDOSCOPY;  Service: Gastroenterology;  Laterality: N/A;  EYELID LACERATION REPAIR  01/2017   cut the the eye lid to drain the infection out   LEFT HEART CATH AND CORONARY ANGIOGRAPHY N/A 07/25/2022   Procedure: LEFT HEART CATH AND CORONARY ANGIOGRAPHY;  Surgeon: Marykay Lex, MD;  Location: [facility] INVASIVE CV LAB;  Service: Cardiovascular;  Laterality: N/A;   WISDOM TOOTH EXTRACTION      Family History  Problem Relation Age of Onset   Hypertension Mother    Diabetes Mother    Hypertension Father    Prostate cancer Father    Thyroid disease Father    Skin cancer Sister    Diabetes Brother    Hypertension Brother    Kidney disease Neg Hx    Bladder Cancer  Neg Hx     Social History   Tobacco Use   Smoking status: Never   Smokeless tobacco: Never  Substance Use Topics   Alcohol use: No    Comment: occasional     Current Outpatient Medications:    aspirin EC 81 MG tablet, Take 1 tablet (81 mg total) by mouth daily. Swallow whole., Disp: 90 tablet, Rfl: 3   benazepril (LOTENSIN) 20 MG tablet, Take 1 tablet (20 mg total) by mouth daily., Disp: 90 tablet, Rfl: 0   carvedilol (COREG) 3.125 MG tablet, Take 1 tablet (3.125 mg total) by mouth 2 (two) times daily with a meal., Disp: 60 tablet, Rfl: 1   celecoxib (CELEBREX) 100 MG capsule, Take 1 capsule (100 mg total) by mouth 2 (two) times daily., Disp: 60 capsule, Rfl: 0   Cholecalciferol (VITAMIN D3) 50 MCG (2000 UT) capsule, Take 1 capsule (2,000 Units total) by mouth daily. (Patient taking differently: Take 5,000 Units by mouth daily.), Disp: 100 capsule, Rfl: 1   Cyanocobalamin (VITAMIN B-12) 3000 MCG SUBL, Place 3,000 mcg under the tongue every other day., Disp: , Rfl:    diclofenac Sodium (VOLTAREN) 1 % GEL, APPLY 2  GRAMS TOPICALLY 4 TIMES DAILY (Patient taking differently: Apply 2 g topically daily as needed (pain).), Disp: 100 g, Rfl: 0   Multiple Vitamins-Minerals (MULTIVITAMIN WITH MINERALS) tablet, Take 1 tablet by mouth daily., Disp: , Rfl:    Papav-Phentolamine-Alprostadil 150-5-50 MG-MG-MCG SOLR, 1 each by Intracavernosal route daily as needed (ED). Trimix, Disp: , Rfl:    rosuvastatin (CRESTOR) 20 MG tablet, Take 1 tablet (20 mg total) by mouth daily., Disp: 90 tablet, Rfl: 0   SYRINGE-NEEDLE, DISP, 3 ML (MONOJECT SAFETY SYRINGE/SHIELD) 23G X 1" 3 ML MISC, Inject 1mL intramuscular every 10days, Disp: , Rfl:    tadalafil (CIALIS) 20 MG tablet, Take 20 mg by mouth daily as needed for erectile dysfunction., Disp: , Rfl:    testosterone cypionate (DEPOTESTOSTERONE CYPIONATE) 200 MG/ML injection, Inject 0.3 mLs into the muscle once a week., Disp: , Rfl:   Allergies  Allergen Reactions    Valsartan Other (See Comments)    diarrhea    I personally reviewed active problem list, medication list, allergies, family history, social history, health maintenance with the patient/caregiver today.   ROS  ***  Objective  There were no vitals filed for this visit.  There is no height or weight on file to calculate BMI.  Physical Exam ***  No results found for this or any previous visit (from the past 2160 hour(s)).   PHQ2/9:    10/12/2022   10:58 AM 08/15/2022    9:14 AM 06/14/2022    9:17 AM 11/15/2021    8:51 AM 06/02/2021   10:17 AM  Depression  screen PHQ 2/9  Decreased Interest 0  0 0 0  Down, Depressed, Hopeless 0 0 0 0 0  PHQ - 2 Score 0 0 0 0 0  Altered sleeping 0 0 0 0 0  Tired, decreased energy 0 0 0 0 0  Change in appetite 0 0 0 0 0  Feeling bad or failure about yourself  0 0 0 0 0  Trouble concentrating 0 0 0 0 0  Moving slowly or fidgety/restless 0 0 0 0 0  Suicidal thoughts 0 0 0 0 0  PHQ-9 Score 0 0 0 0 0  Difficult doing work/chores Not difficult at all        phq 9 is {gen pos [id number]}   Fall Risk:    10/12/2022   10:58 AM 08/15/2022    9:14 AM 06/14/2022    9:17 AM 11/15/2021    8:51 AM 06/02/2021   10:17 AM  Fall Risk   Falls in the past year? 1 0 1 0 0  Number falls in past yr: 0  0 0 0  Injury with Fall? 1  0 0 0  Risk for fall due to : Impaired balance/gait No Fall Risks History of fall(s) No Fall Risks No Fall Risks  Follow up Falls prevention discussed;Education provided;Falls evaluation completed Falls prevention discussed Falls prevention discussed;Education provided;Falls evaluation completed Falls prevention discussed Falls prevention discussed      Functional Status Survey:      Assessment & Plan  *** There are no diagnoses linked to this encounter.

## 2022-11-27 ENCOUNTER — Ambulatory Visit: Payer: BC Managed Care – PPO | Admitting: Family Medicine

## 2022-11-27 DIAGNOSIS — Z23 Encounter for immunization: Secondary | ICD-10-CM

## 2022-12-01 ENCOUNTER — Other Ambulatory Visit: Payer: Self-pay | Admitting: Family Medicine

## 2022-12-01 DIAGNOSIS — E785 Hyperlipidemia, unspecified: Secondary | ICD-10-CM

## 2022-12-01 NOTE — Telephone Encounter (Signed)
Medication Refill -  Most Recent Primary Care Visit:  Provider: Danelle Berry  Department: [facility]  Visit Type: MYCHART VIDEO VISIT  Date: 10/12/2022  Medication: rosuvastatin (CRESTOR) 20 MG tablet   Pt stated Dr.Sowles originally prescribed it but cardiologist increased the dose. However, this medication is filled by PCP. Stated he has been out for a week and needs this filled. Please advise.  Has the patient contacted their pharmacy? Yes (Agent: If no, request that the patient contact the pharmacy for the refill. If patient does not wish to contact the pharmacy document the reason why and proceed with request.) (Agent: If yes, when and what did the pharmacy advise?)  Is this the correct pharmacy for this prescription? Yes If no, delete pharmacy and type the correct one.  This is the patient's preferred pharmacy:  [facility] [address] [place] - [address] [place] [address] Phone: [phone] Fax: [phone]   Has the prescription been filled recently? Yes  Is the patient out of the medication? Yes   Has the patient been seen for an appointment in the last year OR does the patient have an upcoming appointment? Yes  Can we respond through MyChart? Yes  Agent: Please be advised that Rx refills may take up to 3 business days. We ask that you follow-up with your pharmacy.

## 2022-12-04 NOTE — Telephone Encounter (Signed)
Requested medications are due for refill today.  yes  Requested medications are on the active medications list.  yes  Last refill. 07/12/2022 #90 0   Future visit scheduled.   yes  Notes to clinic.  Rx signed by Brain Agbor-Etang    Requested Prescriptions  Pending Prescriptions Disp Refills   rosuvastatin (CRESTOR) 20 MG tablet 90 tablet 0    Sig: Take 1 tablet (20 mg total) by mouth daily.     Cardiovascular:  Antilipid - Statins 2 Failed - 12/01/2022  3:48 PM      Failed - Cr in normal range and within 360 days    Creat  Date Value Ref Range Status  06/14/2022 1.14 0.70 - 1.35 mg/dL Final   Creatinine, Ser  Date Value Ref Range Status  07/24/2022 1.45 (H) 0.61 - 1.24 mg/dL Final         Failed - Lipid Panel in normal range within the last 12 months    Cholesterol, Total  Date Value Ref Range Status  11/19/2018 158 100 - 199 mg/dL Final   Cholesterol  Date Value Ref Range Status  06/14/2022 144 <200 mg/dL Final   LDL Cholesterol (Calc)  Date Value Ref Range Status  06/14/2022 82 mg/dL (calc) Final    Comment:    Reference range: <100 . Desirable range <100 mg/dL for primary prevention;   <70 mg/dL for patients with CHD or diabetic patients  with > or = 2 CHD risk factors. Marland Kitchen LDL-C is now calculated using the Martin-Hopkins  calculation, which is a validated novel method providing  better accuracy than the Friedewald equation in the  estimation of LDL-C.  Horald Pollen et al. Lenox Ahr. 1027;253(66): 2061-2068  (http://education.[employer].com/faq/FAQ164)    HDL  Date Value Ref Range Status  06/14/2022 41 > OR = 40 mg/dL Final  44/03/4740 35 (L) >39 mg/dL Final   Triglycerides  Date Value Ref Range Status  06/14/2022 117 <150 mg/dL Final         Passed - Patient is not pregnant      Passed - Valid encounter within last 12 months    Recent Outpatient Visits           1 month ago Essential hypertension   [facility] Danelle Berry, PA-C   3 months ago Essential hypertension   [facility] Alba Cory, MD   5 months ago Well adult exam   [facility] Alba Cory, MD   1 year ago Vitamin B12 deficiency   [facility] Alba Cory, MD   1 year ago Well adult exam   [facility] Alba Cory, MD       Future Appointments             In 4 days Agbor-Etang, Arlys John, MD [facility] [place]   In 1 week Margarita Mail, DO [facility]

## 2022-12-06 MED ORDER — ROSUVASTATIN CALCIUM 20 MG PO TABS
20.0000 mg | ORAL_TABLET | Freq: Every day | ORAL | 0 refills | Status: DC
Start: 2022-12-06 — End: 2022-12-08

## 2022-12-06 NOTE — Telephone Encounter (Signed)
Pt already has an appt on 12-12-2022.

## 2022-12-08 ENCOUNTER — Encounter: Payer: Self-pay | Admitting: Cardiology

## 2022-12-08 ENCOUNTER — Ambulatory Visit: Payer: BC Managed Care – PPO | Attending: Cardiology | Admitting: Cardiology

## 2022-12-08 VITALS — BP 104/70 | HR 75 | Ht 73.0 in | Wt 224.2 lb

## 2022-12-08 DIAGNOSIS — I2510 Atherosclerotic heart disease of native coronary artery without angina pectoris: Secondary | ICD-10-CM | POA: Diagnosis not present

## 2022-12-08 DIAGNOSIS — E7800 Pure hypercholesterolemia, unspecified: Secondary | ICD-10-CM

## 2022-12-08 DIAGNOSIS — I10 Essential (primary) hypertension: Secondary | ICD-10-CM | POA: Diagnosis not present

## 2022-12-08 DIAGNOSIS — E785 Hyperlipidemia, unspecified: Secondary | ICD-10-CM | POA: Diagnosis not present

## 2022-12-08 MED ORDER — ROSUVASTATIN CALCIUM 20 MG PO TABS
20.0000 mg | ORAL_TABLET | Freq: Every day | ORAL | 0 refills | Status: DC
Start: 2022-12-08 — End: 2023-03-06

## 2022-12-08 NOTE — Patient Instructions (Signed)
Medication Instructions:   RESTART Crestor - Take one tablet ( 20mg ) by mouth daily.   *If you need a refill on your cardiac medications before your next appointment, please call your pharmacy*   Lab Work:  None Ordered  If you have labs (blood work) drawn today and your tests are completely normal, you will receive your results only by: MyChart Message (if you have MyChart) OR A paper copy in the mail If you have any lab test that is abnormal or we need to change your treatment, we will call you to review the results.   Testing/Procedures:  None Ordered   Follow-Up: At [facility], you and your health needs are our priority.  As part of our continuing mission to provide you with exceptional heart care, we have created designated Provider Care Teams.  These Care Teams include your primary Cardiologist (physician) and Advanced Practice Providers (APPs -  Physician Assistants and Nurse Practitioners) who all work together to provide you with the care you need, when you need it.  We recommend signing up for the patient portal called "MyChart".  Sign up information is provided on this After Visit Summary.  MyChart is used to connect with patients for Virtual Visits (Telemedicine).  Patients are able to view lab/test results, encounter notes, upcoming appointments, etc.  Non-urgent messages can be sent to your provider as well.   To learn more about what you can do with MyChart, go to [email or web address].    Your next appointment:   4 month(s)  Provider:   You may see Debbe Odea, MD or one of the following Advanced Practice Providers on your designated Care Team:   Nicolasa Ducking, NP Eula Listen, PA-C Cadence Fransico Michael, PA-C Charlsie Quest, NP Carlos Levering, NP

## 2022-12-08 NOTE — Progress Notes (Signed)
Cardiology Office Note:    Date:  12/08/2022   ID:  Seth Meadows, DOB 08-18-1960, MRN [id number]  PCP:  Alba Cory, MD  Cardiologist:  Debbe Odea, MD  Electrophysiologist:  None   Referring MD: Alba Cory, MD   Chief Complaint  Patient presents with   Follow-up    Patient reports loose bowels since this summer.  Feels that it could be related to increase of Rosuvastatin from 10mg  to 20mg  and addition of Carvedilol.  Both of these meds were started his summer.  Has been out of Rosuvastatin for 2 weeks with no improvement of symptoms.    History of Present Illness:    Seth Meadows is a 62 y.o. male with a hx of hypertension, CAD (left heart cath 07/2022 -50% proximal LAD, 80% distal LAD, minimal diffuse RCA and left circumflex disease), hypogonadism on testosterone, OSA on CPAP who presents for follow-up.  Denies chest pain.  States having diarrhea over the past 3 to 4 months.  Initially thought this was due to taking Crestor which was previously increased to 20 mg daily.  Has been off Crestor in the past 2 weeks but symptoms of diarrhea have persisted.  Started on carvedilol in the summer, BP became low, developed fatigue, was also bradycardic.  Coreg was reduced to 3.25 mg twice daily.  Symptoms of fatigue have improved but still present.  Prior notes CT cardiac score 06/2022, three-vessel coronary calcification, coronary calcium score 3449, 99th percentile, Valsartan caused loose stools.   Past Medical History:  Diagnosis Date   Allergic rhinitis    BPH with obstruction/lower urinary tract symptoms    Cutaneous eruption    Earache    Epididymitis, left    Hyperglycemia    Hypertension    Hypogonadism in male    Kidney function test abnormal    Overweight    Scrotal pain    Sleep apnea     Past Surgical History:  Procedure Laterality Date   COLONOSCOPY WITH PROPOFOL N/A 07/04/2022   Procedure: COLONOSCOPY WITH PROPOFOL;  Surgeon: Wyline Mood, MD;   Location: [facility] ENDOSCOPY;  Service: Gastroenterology;  Laterality: N/A;   EYELID LACERATION REPAIR  01/2017   cut the the eye lid to drain the infection out   LEFT HEART CATH AND CORONARY ANGIOGRAPHY N/A 07/25/2022   Procedure: LEFT HEART CATH AND CORONARY ANGIOGRAPHY;  Surgeon: Marykay Lex, MD;  Location: [facility] INVASIVE CV LAB;  Service: Cardiovascular;  Laterality: N/A;   WISDOM TOOTH EXTRACTION      Current Medications: Current Meds  Medication Sig   aspirin EC 81 MG tablet Take 1 tablet (81 mg total) by mouth daily. Swallow whole.   benazepril (LOTENSIN) 20 MG tablet Take 1 tablet (20 mg total) by mouth daily.   celecoxib (CELEBREX) 100 MG capsule Take 1 capsule (100 mg total) by mouth 2 (two) times daily.   Cholecalciferol (VITAMIN D3) 50 MCG (2000 UT) capsule Take 1 capsule (2,000 Units total) by mouth daily. (Patient taking differently: Take 5,000 Units by mouth daily.)   Cyanocobalamin (VITAMIN B-12) 3000 MCG SUBL Place 3,000 mcg under the tongue every other day.   diclofenac Sodium (VOLTAREN) 1 % GEL APPLY 2  GRAMS TOPICALLY 4 TIMES DAILY (Patient taking differently: Apply 2 g topically daily as needed (pain).)   Multiple Vitamins-Minerals (MULTIVITAMIN WITH MINERALS) tablet Take 1 tablet by mouth daily.   Papav-Phentolamine-Alprostadil 150-5-50 MG-MG-MCG SOLR 1 each by Intracavernosal route daily as needed (ED). Trimix   SYRINGE-NEEDLE, DISP, 3 ML (  MONOJECT SAFETY SYRINGE/SHIELD) 23G X 1" 3 ML MISC Inject 1mL intramuscular every 10days   tadalafil (CIALIS) 20 MG tablet Take 20 mg by mouth daily as needed for erectile dysfunction.   testosterone cypionate (DEPOTESTOSTERONE CYPIONATE) 200 MG/ML injection Inject 0.3 mLs into the muscle once a week.   [DISCONTINUED] carvedilol (COREG) 3.125 MG tablet Take 1 tablet (3.125 mg total) by mouth 2 (two) times daily with a meal.     Allergies:   Valsartan   Social History   Socioeconomic History   Marital status: Married    Spouse name:  July    Number of children: 3   Years of education: Not on file   Highest education level: Bachelor's degree (e.g., BA, AB, BS)  Occupational History   Occupation: Acupuncturist   Tobacco Use   Smoking status: Never   Smokeless tobacco: Never  Vaping Use   Vaping status: Never Used  Substance and Sexual Activity   Alcohol use: No    Comment: occasional   Drug use: No   Sexual activity: Yes    Partners: Female  Other Topics Concern   Not on file  Social History Narrative   Married, has three children,    Twin daughters still in middle school   Son is going to school at [employer]    He also has a step son    Social Determinants of Health   Financial Resource Strain: Low Risk  (08/14/2022)   Overall Financial Resource Strain (CARDIA)    Difficulty of Paying Living Expenses: Not hard at all  Food Insecurity: No Food Insecurity (08/14/2022)   Hunger Vital Sign    Worried About Running Out of Food in the Last Year: Never true    Ran Out of Food in the Last Year: Never true  Transportation Needs: No Transportation Needs (08/14/2022)   PRAPARE - Administrator, Civil Service (Medical): No    Lack of Transportation (Non-Medical): No  Physical Activity: Sufficiently Active (08/14/2022)   Exercise Vital Sign    Days of Exercise per Week: 7 days    Minutes of Exercise per Session: 30 min  Stress: No Stress Concern Present (08/14/2022)   [employer] of Occupational Health - Occupational Stress Questionnaire    Feeling of Stress : Only a little  Social Connections: Socially Integrated (08/14/2022)   Social Connection and Isolation Panel [NHANES]    Frequency of Communication with Friends and Family: More than three times a week    Frequency of Social Gatherings with Friends and Family: More than three times a week    Attends Religious Services: More than 4 times per year    Active Member of [employer] or Organizations: No    Attends Engineer, structural: More than  4 times per year    Marital Status: Married     Family History: The patient's family history includes Diabetes in his brother and mother; Hypertension in his brother, father, and mother; Prostate cancer in his father; Skin cancer in his sister; Thyroid disease in his father. There is no history of Kidney disease or Bladder Cancer.  ROS:   Please see the history of present illness.     All other systems reviewed and are negative.  EKGs/Labs/Other Studies Reviewed:    The following studies were reviewed today:      Recent Labs: 06/14/2022: ALT 29; TSH 2.48 07/24/2022: BUN 29; Creatinine, Ser 1.45; Hemoglobin 17.1; Platelets 195; Potassium 3.6; Sodium 135  Recent Lipid Panel  Component Value Date/Time   CHOL 144 06/14/2022 0944   CHOL 158 11/19/2018 0803   TRIG 117 06/14/2022 0944   HDL 41 06/14/2022 0944   HDL 35 (L) 11/19/2018 0803   CHOLHDL 3.5 06/14/2022 0944   VLDL 12 07/10/2016 0835   LDLCALC 82 06/14/2022 0944    Physical Exam:    VS:  BP 104/70 (BP Location: Left Arm, Patient Position: Sitting, Cuff Size: Large)   Pulse 75   Ht 6\' 1"  (1.854 m)   Wt 224 lb 3.2 oz (101.7 kg)   SpO2 96%   BMI 29.58 kg/m     Wt Readings from Last 3 Encounters:  12/08/22 224 lb 3.2 oz (101.7 kg)  08/15/22 241 lb 4.8 oz (109.5 kg)  08/04/22 246 lb (111.6 kg)     GEN:  Well nourished, well developed in no acute distress HEENT: Normal NECK: No JVD; No carotid bruits CARDIAC: RRR, no murmurs, rubs, gallops RESPIRATORY:  Clear to auscultation without rales, wheezing or rhonchi  ABDOMEN: Soft, non-tender, non-distended MUSCULOSKELETAL:  No edema; No deformity  SKIN: Warm and dry NEUROLOGIC:  Alert and oriented x 3 PSYCHIATRIC:  Normal affect   ASSESSMENT:    1. Coronary artery disease involving native coronary artery of native heart, unspecified whether angina present   2. Primary hypertension   3. Pure hypercholesterolemia   4. Hyperlipidemia LDL goal <70     PLAN:     In order of problems listed above:  CAD, 50% mid LAD, 80% distal LAD.  Minimal diffuse disease in RCA and left circumflex.  Continue aspirin 81 mg, Crestor 20 mg daily.  Patient currently denies chest pain.  Consider Ranexa if angina occurs. Hypertension, BP low normal, complains of fatigue.  Stop carvedilol, continue benazepril 20. Hyperlipidemia, goal LDL less than 70.  Continue Crestor to 20 mg daily.  Follow-up in 4 months    Medication Adjustments/Labs and Tests Ordered: Current medicines are reviewed at length with the patient today.  Concerns regarding medicines are outlined above.  No orders of the defined types were placed in this encounter.  Meds ordered this encounter  Medications   rosuvastatin (CRESTOR) 20 MG tablet    Sig: Take 1 tablet (20 mg total) by mouth daily.    Dispense:  90 tablet    Refill:  0    Patient Instructions  Medication Instructions:   RESTART Crestor - Take one tablet ( 20mg ) by mouth daily.   *If you need a refill on your cardiac medications before your next appointment, please call your pharmacy*   Lab Work:  None Ordered  If you have labs (blood work) drawn today and your tests are completely normal, you will receive your results only by: MyChart Message (if you have MyChart) OR A paper copy in the mail If you have any lab test that is abnormal or we need to change your treatment, we will call you to review the results.   Testing/Procedures:  None Ordered   Follow-Up: At [facility], you and your health needs are our priority.  As part of our continuing mission to provide you with exceptional heart care, we have created designated Provider Care Teams.  These Care Teams include your primary Cardiologist (physician) and Advanced Practice Providers (APPs -  Physician Assistants and Nurse Practitioners) who all work together to provide you with the care you need, when you need it.  We recommend signing up for the patient  portal called "MyChart".  Sign up information  is provided on this After Visit Summary.  MyChart is used to connect with patients for Virtual Visits (Telemedicine).  Patients are able to view lab/test results, encounter notes, upcoming appointments, etc.  Non-urgent messages can be sent to your provider as well.   To learn more about what you can do with MyChart, go to [email or web address].    Your next appointment:   4 month(s)  Provider:   You may see Debbe Odea, MD or one of the following Advanced Practice Providers on your designated Care Team:   Nicolasa Ducking, NP Eula Listen, PA-C Cadence Fransico Michael, PA-C Charlsie Quest, NP Carlos Levering, NP    Signed, Debbe Odea, MD  12/08/2022 4:27 PM    [facility]

## 2022-12-11 NOTE — Progress Notes (Unsigned)
   Established Patient Office Visit  Subjective   Patient ID: Seth Meadows, male    DOB: 10/03/1960  Age: 62 y.o. MRN: [id number]  No chief complaint on file.   HPI  Patient is presenting for follow up on chronic medical conditions.   Hypertension/OSA: -Medications: Benazepril 20 mg -Patient is compliant with above medications and reports no side effects. -Checking BP at home (average): *** -Highest BP at home: *** -Lowest BP at home: *** -Denies any SOB, CP, vision changes, LE edema or symptoms of hypotension -Diet: *** -Exercise: ***  HLD/CAD: -Medications: Crestor 20 mg -Patient is compliant with above medications and reports no side effects. *** -Last lipid panel: Lipid Panel     Component Value Date/Time   CHOL 144 06/14/2022 0944   CHOL 158 11/19/2018 0803   TRIG 117 06/14/2022 0944   HDL 41 06/14/2022 0944   HDL 35 (L) 11/19/2018 0803   CHOLHDL 3.5 06/14/2022 0944   VLDL 12 07/10/2016 0835   LDLCALC 82 06/14/2022 0944   LABVLDL 17 11/19/2018 0803    Low Testosterone/ED: -Following with Urology, currently on testosterone and Cialis  Health Maintenance: -Blood work UTD   {History (Optional):23778}  ROS    Objective:     There were no vitals taken for this visit. {Vitals History (Optional):23777}  Physical Exam   No results found for any visits on 12/12/22.  {Labs (Optional):23779}  The 10-year ASCVD risk score (Arnett DK, et al., 2019) is: 7.8%    Assessment & Plan:   Problem List Items Addressed This Visit   None   No follow-ups on file.    Margarita Mail, DO

## 2022-12-12 ENCOUNTER — Ambulatory Visit (INDEPENDENT_AMBULATORY_CARE_PROVIDER_SITE_OTHER): Payer: BC Managed Care – PPO | Admitting: Internal Medicine

## 2022-12-12 ENCOUNTER — Encounter: Payer: Self-pay | Admitting: Internal Medicine

## 2022-12-12 VITALS — BP 118/74 | HR 97 | Temp 98.5°F | Resp 18 | Ht 73.0 in | Wt 222.8 lb

## 2022-12-12 DIAGNOSIS — I2510 Atherosclerotic heart disease of native coronary artery without angina pectoris: Secondary | ICD-10-CM | POA: Diagnosis not present

## 2022-12-12 DIAGNOSIS — I10 Essential (primary) hypertension: Secondary | ICD-10-CM | POA: Diagnosis not present

## 2022-12-12 MED ORDER — BENAZEPRIL HCL 20 MG PO TABS: 20.0000 mg | ORAL_TABLET | Freq: Every day | ORAL | 1 refills | Status: DC

## 2022-12-12 NOTE — Telephone Encounter (Signed)
Pt states he forgot to mention during his appt today that he would like another sleep study ordered. Please advise

## 2022-12-14 ENCOUNTER — Encounter: Payer: Self-pay | Admitting: Family Medicine

## 2022-12-15 ENCOUNTER — Other Ambulatory Visit: Payer: Self-pay | Admitting: Internal Medicine

## 2022-12-15 DIAGNOSIS — G4733 Obstructive sleep apnea (adult) (pediatric): Secondary | ICD-10-CM

## 2023-01-04 ENCOUNTER — Ambulatory Visit: Payer: BC Managed Care – PPO | Admitting: Nurse Practitioner

## 2023-01-04 ENCOUNTER — Encounter: Payer: Self-pay | Admitting: Nurse Practitioner

## 2023-01-04 ENCOUNTER — Encounter: Payer: Self-pay | Admitting: Urology

## 2023-01-04 VITALS — BP 118/88 | HR 78 | Temp 97.6°F | Wt 225.8 lb

## 2023-01-04 DIAGNOSIS — G4733 Obstructive sleep apnea (adult) (pediatric): Secondary | ICD-10-CM

## 2023-01-04 NOTE — Progress Notes (Signed)
@Patient  ID: Seth Meadows, male    DOB: August 28, 1960, 62 y.o.   MRN: [id number]  Chief Complaint  Patient presents with   Consult    Sleep study done over 5 years. Wearing CPAP nightly. No problems with pressure or mask. Pt has lost 50-60 lbs in the last few months.     Referring provider: Margarita Mail, DO  HPI: 62 year old male, never smoker referred for sleep consult.  Past medical history significant for OSA on CPAP, hypertension, HLD.  TEST/EVENTS:  2016 PSG: AHI 22.6/h  01/04/2023: Today-sleep consult Patient presents today for sleep consult.  He has a history of what he believes is severe sleep apnea diagnosed in 2016.  He has been on CPAP since this time.  He has been working on weight loss measures and is down 60 pounds over the last 2 years.  He is still sleeping with his CPAP and has not tried without it but is wanting to see if he could possibly stop using it.  He did previously have a history of snoring and some daytime fatigue symptoms.  Feels like his energy levels are good now.  Sleeps well at night.  Denies any issues with drowsy driving, morning headaches, sleep parasomnia/paralysis. He goes to bed around midnight.  Falls asleep within 10 minutes.  Wakes 1-2 times night to use the restroom.  Typically gets up around 7:30 AM.  Does not operate any heavy machinery in his job field.  Last sleep study was in 2016.  Study obtained revealed moderate sleep apnea with AHI 22.6/h.  He is on CPAP 7 cm of water.  Wearing nasal pillow mask. He has a history of hypertension, well-controlled on antihypertensives.  No history of stroke or diabetes. He is a never smoker.  Does not drink alcohol.  No excessive caffeine intake.  Lives with his wife.  He has 3 children.  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  Family history of heart disease and cancer.  Epworth 3  12/06/2022-01/04/2023 CPAP 7 cmH2O 29/30 days; 97% greater than 4-hour; average use 6 hours 33 minutes Leaks 95th  19.7 AHI 1  Allergies  Allergen Reactions   Valsartan Other (See Comments)    diarrhea    Immunization History  Administered Date(s) Administered   IPV 10/19/2014   Influenza Split 10/20/2013   Influenza, Seasonal, Injecte, Preservative Fre 10/19/2011   Influenza,inj,Quad PF,6+ Mos 12/06/2016, 10/12/2017, 10/16/2018, 12/05/2019, 11/11/2020   Influenza-Unspecified 02/15/2016, 10/20/2021, 09/24/2022   PFIZER(Purple Top)SARS-COV-2 Vaccination 04/02/2019, 04/29/2019, 12/05/2019   Pfizer Covid-19 Vaccine Bivalent Booster 49yrs & up 10/20/2021   Tdap 10/20/2013, 10/19/2014   Zoster Recombinant(Shingrix) 02/04/2020, 05/04/2020   Zoster, Live 10/19/2014    Past Medical History:  Diagnosis Date   Allergic rhinitis    BPH with obstruction/lower urinary tract symptoms    Cutaneous eruption    Earache    Epididymitis, left    Hyperglycemia    Hypertension    Hypogonadism in male    Kidney function test abnormal    Overweight    Scrotal pain    Sleep apnea     Tobacco History: Social History   Tobacco Use  Smoking Status Never  Smokeless Tobacco Never   Counseling given: Not Answered   Outpatient Medications Prior to Visit  Medication Sig Dispense Refill   aspirin EC 81 MG tablet Take 1 tablet (81 mg total) by mouth daily. Swallow whole. 90 tablet 3   benazepril (LOTENSIN) 20 MG tablet Take 1 tablet (20 mg  total) by mouth daily. 90 tablet 1   celecoxib (CELEBREX) 100 MG capsule Take 1 capsule (100 mg total) by mouth 2 (two) times daily. 60 capsule 0   Cyanocobalamin (VITAMIN B-12) 3000 MCG SUBL Place 3,000 mcg under the tongue every other day.     Multiple Vitamins-Minerals (MULTIVITAMIN WITH MINERALS) tablet Take 1 tablet by mouth daily.     Papav-Phentolamine-Alprostadil 150-5-50 MG-MG-MCG SOLR 1 each by Intracavernosal route daily as needed (ED). Trimix     rosuvastatin (CRESTOR) 20 MG tablet Take 1 tablet (20 mg total) by mouth daily. 90 tablet 0   SYRINGE-NEEDLE,  DISP, 3 ML (MONOJECT SAFETY SYRINGE/SHIELD) 23G X 1" 3 ML MISC Inject 1mL intramuscular every 10days     tadalafil (CIALIS) 20 MG tablet Take 20 mg by mouth daily as needed for erectile dysfunction.     testosterone cypionate (DEPOTESTOSTERONE CYPIONATE) 200 MG/ML injection Inject 0.3 mLs into the muscle once a week.     No facility-administered medications prior to visit.     Review of Systems:   Constitutional: No night sweats, fevers, chills, fatigue, or lassitude. +intentional weight loss HEENT: No headaches, difficulty swallowing, tooth/dental problems, or sore throat. No sneezing, itching, ear ache, nasal congestion, or post nasal drip CV:  No chest pain, orthopnea, PND, swelling in lower extremities, anasarca, dizziness, palpitations, syncope Resp: No shortness of breath with exertion or at rest. No excess mucus or change in color of mucus. No productive or non-productive. No hemoptysis. No wheezing.  No chest wall deformity GI:  No heartburn, indigestion GU: No nocturia Skin: No rash, lesions, ulcerations MSK:  No joint pain or swelling.   Neuro: No dizziness or lightheadedness.  Psych: No depression or anxiety. Mood stable.     Physical Exam:  BP 118/88 (BP Location: Left Arm, Patient Position: Sitting, Cuff Size: Normal)   Pulse 78   Temp 97.6 F (36.4 C) (Temporal)   Wt 225 lb 12.8 oz (102.4 kg)   SpO2 97%   BMI 29.79 kg/m   GEN: Pleasant, interactive, well-appearing; in no acute distress HEENT:  Normocephalic and atraumatic. PERRLA. Sclera white. Nasal turbinates pink, moist and patent bilaterally. No rhinorrhea present. Oropharynx pink and moist, without exudate or edema. No lesions, ulcerations, or postnasal drip. Mallampati II NECK:  Supple w/ fair ROM. No JVD present. Normal carotid impulses w/o bruits. Thyroid symmetrical with no goiter or nodules palpated. No lymphadenopathy.   CV: RRR, no m/r/g, no peripheral edema. Pulses intact, +2 bilaterally. No cyanosis,  pallor or clubbing. PULMONARY:  Unlabored, regular breathing. Clear bilaterally A&P w/o wheezes/rales/rhonchi. No accessory muscle use.  GI: BS present and normoactive. Soft, non-tender to palpation. No organomegaly or masses detected.  MSK: No erythema, warmth or tenderness. Cap refil <2 sec all extrem. No deformities or joint swelling noted.  Neuro: A/Ox3. No focal deficits noted.   Skin: Warm, no lesions or rashe Psych: Normal affect and behavior. Judgement and thought content appropriate.     Lab Results:  CBC    Component Value Date/Time   WBC 8.7 07/24/2022 1141   RBC 5.83 (H) 07/24/2022 1141   HGB 17.1 (H) 07/24/2022 1141   HGB 18.4 (H) 11/10/2019 0818   HCT 49.9 07/24/2022 1141   HCT 56.3 (H) 03/29/2021 0833   PLT 195 07/24/2022 1141   PLT 193 11/10/2019 0818   MCV 85.6 07/24/2022 1141   MCV 85 11/10/2019 0818   MCH 29.3 07/24/2022 1141   MCHC 34.3 07/24/2022 1141   RDW 12.3 07/24/2022  1141   RDW 15.1 11/10/2019 0818   LYMPHSABS 1,702 06/14/2022 0944   LYMPHSABS 1.7 11/10/2019 0818   MONOABS 693 07/12/2016 1201   EOSABS 179 06/14/2022 0944   EOSABS 0.2 11/10/2019 0818   BASOSABS 50 06/14/2022 0944   BASOSABS 0.1 11/10/2019 0818    BMET    Component Value Date/Time   NA 135 07/24/2022 1141   NA 139 06/03/2019 0750   K 3.6 07/24/2022 1141   CL 103 07/24/2022 1141   CO2 24 07/24/2022 1141   GLUCOSE 100 (H) 07/24/2022 1141   BUN 29 (H) 07/24/2022 1141   BUN 18 06/03/2019 0750   CREATININE 1.45 (H) 07/24/2022 1141   CREATININE 1.14 06/14/2022 0944   CALCIUM 8.7 (L) 07/24/2022 1141   GFRNONAA 54 (L) 07/24/2022 1141   GFRNONAA 63 02/04/2020 0858   GFRAA 73 02/04/2020 0858    BNP No results found for: "BNP"   Imaging:  No results found.  Administration History     None           No data to display          No results found for: "NITRICOXIDE"      Assessment & Plan:   OSA on CPAP History of moderate OSA, on CPAP.  He has excellent  compliance and control on current download.  He has had significant weight loss and would like to reassess the severity of his sleep apnea to determine continued CPAP use.  He would be willing to stay on CPAP if he did still have moderate to severe sleep apnea.  Will plan to obtain repeat home sleep study for further evaluation.  Discussed risks of untreated sleep apnea and potential treatment options.  Will reassess at follow-up.  Aware of safe driving practices.  Understands proper use/care of CPAP device.  Patient Instructions  Given your drastic weight loss, we will repeat your home sleep study to reassess the severity of your sleep apnea. Someone will contact you to schedule this. If you do still have sleep apnea, we can discuss continuing CPAP vs alternative treatment options depending on the severity.   We discussed how untreated sleep apnea puts an individual at risk for cardiac arrhthymias, pulm HTN, DM, stroke and increases their risk for daytime accidents. We also briefly reviewed treatment options including weight loss, side sleeping position, oral appliance, CPAP therapy or referral to ENT for possible surgical options  Do not wear your CPAP the night of your sleep study  Follow up in 6-8 weeks with Katie Yamile Roedl,NP to review sleep study results. Ok to do Friday PM virtual clinic slot if ok with pt.    Advised if symptoms do not improve or worsen, to please contact office for sooner follow up or seek emergency care.   I spent 35 minutes of dedicated to the care of this patient on the date of this encounter to include pre-visit review of records, face-to-face time with the patient discussing conditions above, post visit ordering of testing, clinical documentation with the electronic health record, making appropriate referrals as documented, and communicating necessary findings to members of the patients care team.  Noemi Chapel, NP 01/04/2023  Pt aware and understands NP's role.

## 2023-01-04 NOTE — Patient Instructions (Signed)
Given your drastic weight loss, we will repeat your home sleep study to reassess the severity of your sleep apnea. Someone will contact you to schedule this. If you do still have sleep apnea, we can discuss continuing CPAP vs alternative treatment options depending on the severity.   We discussed how untreated sleep apnea puts an individual at risk for cardiac arrhthymias, pulm HTN, DM, stroke and increases their risk for daytime accidents. We also briefly reviewed treatment options including weight loss, side sleeping position, oral appliance, CPAP therapy or referral to ENT for possible surgical options  Do not wear your CPAP the night of your sleep study  Follow up in 6-8 weeks with Seth Braelynn Benning,NP to review sleep study results. Ok to do Friday PM virtual clinic slot if ok with pt.

## 2023-01-04 NOTE — Assessment & Plan Note (Signed)
History of moderate OSA, on CPAP.  He has excellent compliance and control on current download.  He has had significant weight loss and would like to reassess the severity of his sleep apnea to determine continued CPAP use.  He would be willing to stay on CPAP if he did still have moderate to severe sleep apnea.  Will plan to obtain repeat home sleep study for further evaluation.  Discussed risks of untreated sleep apnea and potential treatment options.  Will reassess at follow-up.  Aware of safe driving practices.  Understands proper use/care of CPAP device.  Patient Instructions  Given your drastic weight loss, we will repeat your home sleep study to reassess the severity of your sleep apnea. Someone will contact you to schedule this. If you do still have sleep apnea, we can discuss continuing CPAP vs alternative treatment options depending on the severity.   We discussed how untreated sleep apnea puts an individual at risk for cardiac arrhthymias, pulm HTN, DM, stroke and increases their risk for daytime accidents. We also briefly reviewed treatment options including weight loss, side sleeping position, oral appliance, CPAP therapy or referral to ENT for possible surgical options  Do not wear your CPAP the night of your sleep study  Follow up in 6-8 weeks with Katie Finn Amos,NP to review sleep study results. Ok to do Friday PM virtual clinic slot if ok with pt.

## 2023-01-05 ENCOUNTER — Ambulatory Visit: Payer: BC Managed Care – PPO | Admitting: Internal Medicine

## 2023-01-09 DIAGNOSIS — G4733 Obstructive sleep apnea (adult) (pediatric): Secondary | ICD-10-CM

## 2023-02-07 ENCOUNTER — Other Ambulatory Visit: Payer: Self-pay

## 2023-02-07 ENCOUNTER — Observation Stay: Payer: 59 | Admitting: Registered Nurse

## 2023-02-07 ENCOUNTER — Observation Stay
Admission: EM | Admit: 2023-02-07 | Discharge: 2023-02-08 | Disposition: A | Discharge status: Home / Self Care | Payer: 59 | Attending: Surgery | Admitting: Surgery

## 2023-02-07 ENCOUNTER — Encounter
Admission: EM | Disposition: A | Discharge status: Home / Self Care | Payer: Self-pay | Source: Home / Self Care | Attending: Emergency Medicine

## 2023-02-07 ENCOUNTER — Emergency Department: Payer: 59

## 2023-02-07 ENCOUNTER — Encounter: Payer: Self-pay | Admitting: Emergency Medicine

## 2023-02-07 DIAGNOSIS — I10 Essential (primary) hypertension: Secondary | ICD-10-CM | POA: Diagnosis not present

## 2023-02-07 DIAGNOSIS — N289 Disorder of kidney and ureter, unspecified: Secondary | ICD-10-CM | POA: Diagnosis not present

## 2023-02-07 DIAGNOSIS — Z79899 Other long term (current) drug therapy: Secondary | ICD-10-CM | POA: Diagnosis not present

## 2023-02-07 DIAGNOSIS — K3580 Unspecified acute appendicitis: Secondary | ICD-10-CM | POA: Diagnosis not present

## 2023-02-07 DIAGNOSIS — N5089 Other specified disorders of the male genital organs: Secondary | ICD-10-CM | POA: Diagnosis not present

## 2023-02-07 DIAGNOSIS — Z7982 Long term (current) use of aspirin: Secondary | ICD-10-CM | POA: Diagnosis not present

## 2023-02-07 DIAGNOSIS — R1031 Right lower quadrant pain: Secondary | ICD-10-CM | POA: Diagnosis present

## 2023-02-07 DIAGNOSIS — N200 Calculus of kidney: Secondary | ICD-10-CM | POA: Insufficient documentation

## 2023-02-07 DIAGNOSIS — R110 Nausea: Secondary | ICD-10-CM

## 2023-02-07 HISTORY — PX: XI ROBOTIC LAPAROSCOPIC ASSISTED APPENDECTOMY: SHX6877

## 2023-02-07 LAB — URINALYSIS, ROUTINE W REFLEX MICROSCOPIC
Bilirubin Urine: NEGATIVE
Glucose, UA: NEGATIVE mg/dL
Hgb urine dipstick: NEGATIVE
Ketones, ur: NEGATIVE mg/dL
Leukocytes,Ua: NEGATIVE
Nitrite: NEGATIVE
Protein, ur: NEGATIVE mg/dL
Specific Gravity, Urine: 1.024 (ref 1.005–1.030)
pH: 7 (ref 5.0–8.0)

## 2023-02-07 LAB — CBC
HCT: 45.8 % (ref 39.0–52.0)
Hemoglobin: 15.9 g/dL (ref 13.0–17.0)
MCH: 30.4 pg (ref 26.0–34.0)
MCHC: 34.7 g/dL (ref 30.0–36.0)
MCV: 87.6 fL (ref 80.0–100.0)
Platelets: 196 10*3/uL (ref 150–400)
RBC: 5.23 MIL/uL (ref 4.22–5.81)
RDW: 12.3 % (ref 11.5–15.5)
WBC: 12.8 10*3/uL — ABNORMAL HIGH (ref 4.0–10.5)
nRBC: 0 % (ref 0.0–0.2)

## 2023-02-07 LAB — COMPREHENSIVE METABOLIC PANEL
ALT: 25 U/L (ref 0–44)
AST: 29 U/L (ref 15–41)
Albumin: 4 g/dL (ref 3.5–5.0)
Alkaline Phosphatase: 48 U/L (ref 38–126)
Anion gap: 11 (ref 5–15)
BUN: 27 mg/dL — ABNORMAL HIGH (ref 8–23)
CO2: 25 mmol/L (ref 22–32)
Calcium: 8.9 mg/dL (ref 8.9–10.3)
Chloride: 100 mmol/L (ref 98–111)
Creatinine, Ser: 1.29 mg/dL — ABNORMAL HIGH (ref 0.61–1.24)
GFR, Estimated: >60 mL/min (ref >60)
Glucose, Bld: 132 mg/dL — ABNORMAL HIGH (ref 70–99)
Potassium: 3.9 mmol/L (ref 3.5–5.1)
Sodium: 136 mmol/L (ref 135–145)
Total Bilirubin: 0.9 mg/dL (ref 0.0–1.2)
Total Protein: 7.2 g/dL (ref 6.5–8.1)

## 2023-02-07 LAB — LIPASE, BLOOD: Lipase: 34 U/L (ref 11–51)

## 2023-02-07 SURGERY — APPENDECTOMY, ROBOT-ASSISTED, LAPAROSCOPIC
Anesthesia: General | Site: Abdomen

## 2023-02-07 MED ORDER — SUCCINYLCHOLINE CHLORIDE 200 MG/10ML IV SOSY
PREFILLED_SYRINGE | INTRAVENOUS | Status: DC | PRN
  Administered 2023-02-07: 120 mg via INTRAVENOUS

## 2023-02-07 MED ORDER — SUGAMMADEX SODIUM 200 MG/2ML IV SOLN
INTRAVENOUS | Status: DC | PRN
  Administered 2023-02-07: 200 mg via INTRAVENOUS

## 2023-02-07 MED ORDER — FENTANYL CITRATE (PF) 100 MCG/2ML IJ SOLN
INTRAMUSCULAR | Status: AC
  Filled 2023-02-07: qty 2

## 2023-02-07 MED ORDER — ONDANSETRON 4 MG PO TBDP: 4.0000 mg | ORAL_TABLET | Freq: Four times a day (QID) | ORAL | Status: DC | PRN

## 2023-02-07 MED ORDER — SODIUM CHLORIDE 0.9 % IV SOLN
2.0000 g | INTRAVENOUS | Status: DC
  Administered 2023-02-07: 2 g via INTRAVENOUS
  Filled 2023-02-07 (×2): qty 20

## 2023-02-07 MED ORDER — METRONIDAZOLE 500 MG/100ML IV SOLN
500.0000 mg | Freq: Once | INTRAVENOUS | Status: AC
  Administered 2023-02-07: 500 mg via INTRAVENOUS
  Filled 2023-02-07: qty 100

## 2023-02-07 MED ORDER — ROCURONIUM BROMIDE 100 MG/10ML IV SOLN
INTRAVENOUS | Status: DC | PRN
  Administered 2023-02-07: 10 mg via INTRAVENOUS
  Administered 2023-02-07: 40 mg via INTRAVENOUS

## 2023-02-07 MED ORDER — KETOROLAC TROMETHAMINE 30 MG/ML IJ SOLN
INTRAMUSCULAR | Status: DC | PRN
  Administered 2023-02-07: 15 mg via INTRAVENOUS

## 2023-02-07 MED ORDER — ONDANSETRON HCL 4 MG/2ML IJ SOLN
4.0000 mg | Freq: Once | INTRAMUSCULAR | Status: AC
  Administered 2023-02-07: 4 mg via INTRAVENOUS
  Filled 2023-02-07: qty 2

## 2023-02-07 MED ORDER — PHENYLEPHRINE 80 MCG/ML (10ML) SYRINGE FOR IV PUSH (FOR BLOOD PRESSURE SUPPORT)
PREFILLED_SYRINGE | INTRAVENOUS | Status: AC
  Filled 2023-02-07: qty 10

## 2023-02-07 MED ORDER — ONDANSETRON HCL 4 MG/2ML IJ SOLN: 4.0000 mg | Freq: Four times a day (QID) | INTRAMUSCULAR | Status: DC | PRN

## 2023-02-07 MED ORDER — SODIUM CHLORIDE 0.9 % IV SOLN: 1.0000 g | Freq: Once | INTRAVENOUS | Status: DC

## 2023-02-07 MED ORDER — ROCURONIUM BROMIDE 10 MG/ML (PF) SYRINGE
PREFILLED_SYRINGE | INTRAVENOUS | Status: AC
  Filled 2023-02-07: qty 10

## 2023-02-07 MED ORDER — MIDAZOLAM HCL 2 MG/2ML IJ SOLN
INTRAMUSCULAR | Status: AC
Start: 2023-02-07 — End: ?
  Filled 2023-02-07: qty 2

## 2023-02-07 MED ORDER — BUPIVACAINE HCL (PF) 0.5 % IJ SOLN
INTRAMUSCULAR | Status: AC
  Filled 2023-02-07: qty 30

## 2023-02-07 MED ORDER — EPHEDRINE 5 MG/ML INJ
INTRAVENOUS | Status: AC
  Filled 2023-02-07: qty 5

## 2023-02-07 MED ORDER — BUPIVACAINE-EPINEPHRINE (PF) 0.5% -1:200000 IJ SOLN
INTRAMUSCULAR | Status: DC | PRN
  Administered 2023-02-07: 30 mL

## 2023-02-07 MED ORDER — LACTATED RINGERS IV SOLN: INTRAVENOUS | Status: DC | PRN

## 2023-02-07 MED ORDER — ONDANSETRON HCL 4 MG/2ML IJ SOLN
INTRAMUSCULAR | Status: DC | PRN
  Administered 2023-02-07: 4 mg via INTRAVENOUS

## 2023-02-07 MED ORDER — KETOROLAC TROMETHAMINE 30 MG/ML IJ SOLN: 15.0000 mg | Freq: Four times a day (QID) | INTRAMUSCULAR | Status: DC | PRN

## 2023-02-07 MED ORDER — EPHEDRINE SULFATE-NACL 50-0.9 MG/10ML-% IV SOSY
PREFILLED_SYRINGE | INTRAVENOUS | Status: DC | PRN
  Administered 2023-02-07 (×2): 5 mg via INTRAVENOUS

## 2023-02-07 MED ORDER — METRONIDAZOLE 500 MG/100ML IV SOLN
500.0000 mg | Freq: Two times a day (BID) | INTRAVENOUS | Status: DC
  Administered 2023-02-07: 500 mg via INTRAVENOUS
  Filled 2023-02-07 (×2): qty 100

## 2023-02-07 MED ORDER — MIDAZOLAM HCL 2 MG/2ML IJ SOLN
INTRAMUSCULAR | Status: DC | PRN
  Administered 2023-02-07: 2 mg via INTRAVENOUS

## 2023-02-07 MED ORDER — OXYCODONE HCL 5 MG PO TABS: 5.0000 mg | ORAL_TABLET | ORAL | Status: DC | PRN

## 2023-02-07 MED ORDER — 0.9 % SODIUM CHLORIDE (POUR BTL) OPTIME
TOPICAL | Status: DC | PRN
  Administered 2023-02-07: 500 mL

## 2023-02-07 MED ORDER — HYDROMORPHONE HCL 1 MG/ML IJ SOLN
1.0000 mg | INTRAMUSCULAR | Status: DC | PRN
  Administered 2023-02-07: 1 mg via INTRAVENOUS
  Filled 2023-02-07: qty 1

## 2023-02-07 MED ORDER — DEXAMETHASONE SODIUM PHOSPHATE 10 MG/ML IJ SOLN
INTRAMUSCULAR | Status: DC | PRN
  Administered 2023-02-07: 10 mg via INTRAVENOUS

## 2023-02-07 MED ORDER — SUCCINYLCHOLINE CHLORIDE 200 MG/10ML IV SOSY
PREFILLED_SYRINGE | INTRAVENOUS | Status: AC
  Filled 2023-02-07: qty 10

## 2023-02-07 MED ORDER — ACETAMINOPHEN 500 MG PO TABS
1000.0000 mg | ORAL_TABLET | Freq: Four times a day (QID) | ORAL | Status: DC
  Administered 2023-02-07 – 2023-02-08 (×2): 1000 mg via ORAL
  Filled 2023-02-07 (×2): qty 2

## 2023-02-07 MED ORDER — FENTANYL CITRATE (PF) 100 MCG/2ML IJ SOLN
INTRAMUSCULAR | Status: DC | PRN
  Administered 2023-02-07 (×2): 50 ug via INTRAVENOUS

## 2023-02-07 MED ORDER — LACTATED RINGERS IV BOLUS
1000.0000 mL | Freq: Once | INTRAVENOUS | Status: AC
  Administered 2023-02-07: 1000 mL via INTRAVENOUS

## 2023-02-07 MED ORDER — CEFAZOLIN SODIUM-DEXTROSE 2-3 GM-%(50ML) IV SOLR
INTRAVENOUS | Status: DC | PRN
  Administered 2023-02-07: 2 g via INTRAVENOUS

## 2023-02-07 MED ORDER — SODIUM CHLORIDE 0.9 % IV SOLN: INTRAVENOUS | Status: AC

## 2023-02-07 MED ORDER — LIDOCAINE HCL (CARDIAC) PF 100 MG/5ML IV SOSY
PREFILLED_SYRINGE | INTRAVENOUS | Status: DC | PRN
  Administered 2023-02-07: 80 mg via INTRAVENOUS

## 2023-02-07 MED ORDER — MORPHINE SULFATE (PF) 2 MG/ML IV SOLN
2.0000 mg | Freq: Once | INTRAVENOUS | Status: AC
  Administered 2023-02-07: 2 mg via INTRAVENOUS
  Filled 2023-02-07: qty 1

## 2023-02-07 MED ORDER — EPINEPHRINE PF 1 MG/ML IJ SOLN
INTRAMUSCULAR | Status: AC
  Filled 2023-02-07: qty 1

## 2023-02-07 MED ORDER — ONDANSETRON HCL 4 MG/2ML IJ SOLN
INTRAMUSCULAR | Status: AC
  Filled 2023-02-07: qty 2

## 2023-02-07 MED ORDER — HYDROMORPHONE HCL 1 MG/ML IJ SOLN: 0.5000 mg | INTRAMUSCULAR | Status: DC | PRN

## 2023-02-07 MED ORDER — DEXAMETHASONE SODIUM PHOSPHATE 10 MG/ML IJ SOLN
INTRAMUSCULAR | Status: AC
  Filled 2023-02-07: qty 1

## 2023-02-07 MED ORDER — PHENYLEPHRINE 80 MCG/ML (10ML) SYRINGE FOR IV PUSH (FOR BLOOD PRESSURE SUPPORT)
PREFILLED_SYRINGE | INTRAVENOUS | Status: DC | PRN
  Administered 2023-02-07: 80 ug via INTRAVENOUS

## 2023-02-07 MED ORDER — DEXMEDETOMIDINE HCL IN NACL 80 MCG/20ML IV SOLN
INTRAVENOUS | Status: DC | PRN
  Administered 2023-02-07: 8 ug via INTRAVENOUS
  Administered 2023-02-07: 12 ug via INTRAVENOUS

## 2023-02-07 MED ORDER — IOHEXOL 300 MG/ML  SOLN
100.0000 mL | Freq: Once | INTRAMUSCULAR | Status: AC | PRN
  Administered 2023-02-07: 100 mL via INTRAVENOUS

## 2023-02-07 MED ORDER — PROPOFOL 10 MG/ML IV BOLUS
INTRAVENOUS | Status: DC | PRN
  Administered 2023-02-07: 180 mg via INTRAVENOUS

## 2023-02-07 MED ORDER — CEFAZOLIN SODIUM 1 G IJ SOLR
INTRAMUSCULAR | Status: AC
  Filled 2023-02-07: qty 20

## 2023-02-07 SURGICAL SUPPLY — 44 items
BAG PRESSURE INF REUSE 1000 (BAG) IMPLANT
COVER WAND RF STERILE (DRAPES) ×1 IMPLANT
DERMABOND ADVANCED .7 DNX12 (GAUZE/BANDAGES/DRESSINGS) ×1 IMPLANT
DRAPE ARM DVNC X/XI (DISPOSABLE) ×3 IMPLANT
DRAPE COLUMN DVNC XI (DISPOSABLE) ×1 IMPLANT
ELECT REM PT RETURN 9FT ADLT (ELECTROSURGICAL) ×1
ELECTRODE REM PT RTRN 9FT ADLT (ELECTROSURGICAL) ×1 IMPLANT
FORCEPS BPLR R/ABLATION 8 DVNC (INSTRUMENTS) ×1 IMPLANT
GLOVE SURG SYN 7.0 (GLOVE) ×2 IMPLANT
GLOVE SURG SYN 7.0 PF PI (GLOVE) ×2 IMPLANT
GLOVE SURG SYN 7.5 E (GLOVE) ×2 IMPLANT
GLOVE SURG SYN 7.5 PF PI (GLOVE) ×2 IMPLANT
GOWN STRL REUS W/ TWL LRG LVL3 (GOWN DISPOSABLE) ×3 IMPLANT
GRASPER SUT TROCAR 14GX15 (MISCELLANEOUS) IMPLANT
IRRIGATOR SUCT 8 DISP DVNC XI (IRRIGATION / IRRIGATOR) IMPLANT
IV NS 1000ML BAXH (IV SOLUTION) IMPLANT
KIT PINK PAD W/HEAD ARE REST (MISCELLANEOUS) ×1 IMPLANT
KIT PINK PAD W/HEAD ARM REST (MISCELLANEOUS) ×1 IMPLANT
LABEL OR SOLS (LABEL) IMPLANT
MANIFOLD NEPTUNE II (INSTRUMENTS) ×1 IMPLANT
NDL HYPO 22X1.5 SAFETY MO (MISCELLANEOUS) ×1 IMPLANT
NDL INSUFFLATION 14GA 120MM (NEEDLE) ×1 IMPLANT
NEEDLE HYPO 22X1.5 SAFETY MO (MISCELLANEOUS) ×1 IMPLANT
NEEDLE INSUFFLATION 14GA 120MM (NEEDLE) ×1 IMPLANT
OBTURATOR OPTICAL STND 8 DVNC (TROCAR) ×1
OBTURATOR OPTICALSTD 8 DVNC (TROCAR) ×1 IMPLANT
PACK LAP CHOLECYSTECTOMY (MISCELLANEOUS) ×1 IMPLANT
RELOAD STAPLE 45 3.5 BLU DVNC (STAPLE) IMPLANT
RELOAD STAPLER 3.5X45 BLU DVNC (STAPLE) ×1 IMPLANT
SEAL UNIV 5-12 XI (MISCELLANEOUS) ×3 IMPLANT
SEALER VESSEL EXT DVNC XI (MISCELLANEOUS) ×1 IMPLANT
SET TUBE FILTERED XL AIRSEAL (SET/KITS/TRAYS/PACK) IMPLANT
SET TUBE SMOKE EVAC HIGH FLOW (TUBING) ×1 IMPLANT
SOL ELECTROSURG ANTI STICK (MISCELLANEOUS) ×1
SOLUTION ELECTROSURG ANTI STCK (MISCELLANEOUS) ×1 IMPLANT
STAPLER 45 SUREFORM DVNC (STAPLE) IMPLANT
STAPLER RELOAD 3.5X45 BLU DVNC (STAPLE) ×1
SUT MNCRL AB 4-0 PS2 18 (SUTURE) ×1 IMPLANT
SUT VIC AB 3-0 SH 27X BRD (SUTURE) ×1 IMPLANT
SUT VICRYL 0 UR6 27IN ABS (SUTURE) ×2 IMPLANT
SYS BAG RETRIEVAL 10MM (BASKET) ×1
SYSTEM BAG RETRIEVAL 10MM (BASKET) ×1 IMPLANT
TRAY FOLEY MTR SLVR 16FR STAT (SET/KITS/TRAYS/PACK) ×1 IMPLANT
WATER STERILE IRR 500ML POUR (IV SOLUTION) ×1 IMPLANT

## 2023-02-07 NOTE — Op Note (Signed)
  Procedure Date:  02/07/2023  Pre-operative Diagnosis:  Acute appendicitis  Post-operative Diagnosis: Acute appendicitis  Procedure:  Robotic assisted Appendectomy  Surgeon:  Marene Shape, MD  Anesthesia:  General endotracheal  Estimated Blood Loss:  5 ml  Specimens:  appendix  Complications:  None  Indications for Procedure:  This is a 63 y.o. male who presents with abdominal pain and workup revealing acute appendicitis.  The options of surgery versus observation were reviewed with the patient and/or family. The risks of bleeding, infection, recurrence of symptoms, negative laparoscopy, potential for an open procedure, bowel injury, abscess or infection, were all discussed with the patient and he was willing to proceed.  Description of Procedure: The patient was correctly identified in the preoperative area and brought into the operating room.  The patient was placed supine with VTE prophylaxis in place.  Appropriate time-outs were performed.  Anesthesia was induced and the patient was intubated.  Appropriate antibiotics were infused.  Foley catheter was inserted.  The abdomen was prepped and draped in a sterile fashion. The patient's hernia defect was marked with a marking pen.  A Veress needle was introduced in the left upper quadrant and pneumoperitoneum was obtained with appropriate pressures.  Using Optiview technique, an 8 mm port was introduced in the left lateral abdominal wall without complications.  Then, a 12 mm port was introduced in the left upper quadrant and an 8 mm port in the left lower quadrant under direct visualization.  The DaVinci platform was docked, camera targeted, and instruments placed under direct visualization.  The right lower quadrant was inspected and the appendix was identified and found to be acutely inflamed but without perforation.  The appendix was carefully dissected.  The mesoappendix was divided using the Vessel sealer.  The base of the appendix  was dissected out and divided with a blue load 45 mm stapler.  The appendix was placed in an Endocatch bag.  The right lower quadrant was then inspected again revealing an intact staple line, no bleeding, and no bowel injury.  The DaVinci platform was then undocked and instruments removed.    40 ml of Exparel  solution mixed with 0.5% bupivacaine  with epi was infiltrated around the port sites.  The 12 mm port was removed and the Endocatch bag retrieved.  The fascia was closed under direct visualization utilizing an Endo Close technique with 0 Vicryl suture.  The 8 mm ports were removed. The 12 mm incision was closed using 3-0 Vicryl and 4-0 Monocryl, and the other port incisions were closed with 4-0 Monocryl.  The wounds were cleaned and sealed with DermaBond.  Foley catheter was removed.  The patient was emerged from anesthesia and extubated and brought to the recovery room for further management.  The patient tolerated the procedure well and all counts were correct at the end of the case.   Marene Shape, MD

## 2023-02-07 NOTE — H&P (Signed)
 [facility] SURGICAL HISTORY & PHYSICAL (cpt [phone])  HISTORY OF PRESENT ILLNESS (HPI):  63 y.o. male presented to [facility] ED today for abdominal pain. Patient reports that last night he noticed increased abdominal distension and relatively generalized abdominal discomfort. This is morning, his pain is more localized to RLQ. Associated nausea. No fever, chills, cough, CP, SOB, emesis, or urinary changes. No history of similar. No previous intra-abdominal surgeries. Patient is on 81 mg ASA. Last seen by cardiology in November of 2024 and doing well. Echo from 06/2022 showed LVEF of 60-65%. Work up in the ED revealed a leukocytosis to 12.8K, Hgb to 15.9, sCr - 1.29 (this seems to be his baseline), no electrolyte derangements. CT Abdomen/Pelvis was obtained and concerning for acute appendicitis   General surgery is consulted by emergency medicine physician Dr Drenda Gentle, MD for evaluation and management of acute appendicitis.   PAST MEDICAL HISTORY (PMH):  Past Medical History:  Diagnosis Date   Allergic rhinitis    BPH with obstruction/lower urinary tract symptoms    Cutaneous eruption    Earache    Epididymitis, left    Hyperglycemia    Hypertension    Hypogonadism in male    Kidney function test abnormal    Overweight    Scrotal pain    Sleep apnea     Reviewed. Otherwise negative.   PAST SURGICAL HISTORY [facility]):  Past Surgical History:  Procedure Laterality Date   COLONOSCOPY WITH PROPOFOL  N/A 07/04/2022   Procedure: COLONOSCOPY WITH PROPOFOL ;  Surgeon: Luke Salaam, MD;  Location: [facility] ENDOSCOPY;  Service: Gastroenterology;  Laterality: N/A;   EYELID LACERATION REPAIR  01/2017   cut the the eye lid to drain the infection out   LEFT HEART CATH AND CORONARY ANGIOGRAPHY N/A 07/25/2022   Procedure: LEFT HEART CATH AND CORONARY ANGIOGRAPHY;  Surgeon: Arleen Lacer, MD;  Location: [facility] INVASIVE CV LAB;  Service: Cardiovascular;  Laterality: N/A;   WISDOM TOOTH EXTRACTION       Reviewed. Otherwise negative.   MEDICATIONS:  Prior to Admission medications   Medication Sig Start Date End Date Taking? Authorizing Provider  aspirin  EC 81 MG tablet Take 1 tablet (81 mg total) by mouth daily. Swallow whole. 07/12/22   Constancia Delton, MD  benazepril  (LOTENSIN ) 20 MG tablet Take 1 tablet (20 mg total) by mouth daily. 12/12/22   Rockney Cid, DO  celecoxib  (CELEBREX ) 100 MG capsule Take 1 capsule (100 mg total) by mouth 2 (two) times daily. 08/15/22   Sowles, Krichna, MD  Cyanocobalamin (VITAMIN B-12) 3000 MCG SUBL Place 3,000 mcg under the tongue every other day.    [provider]  Multiple Vitamins-Minerals (MULTIVITAMIN WITH MINERALS) tablet Take 1 tablet by mouth daily.    [provider]  Papav-Phentolamine-Alprostadil 150-5-50 MG-MG-MCG SOLR 1 each by Intracavernosal route daily as needed (ED). [facility]    [facility]  rosuvastatin  (CRESTOR ) 20 MG tablet Take 1 tablet (20 mg total) by mouth daily. 12/08/22   Constancia Delton, MD  SYRINGE-NEEDLE, DISP, 3 ML (MONOJECT SAFETY SYRINGE/SHIELD) 23G X 1" 3 ML MISC Inject 1mL intramuscular every 10days 06/12/18   [provider]  tadalafil (CIALIS) 20 MG tablet Take 20 mg by mouth daily as needed for erectile dysfunction.    [facility]  testosterone  cypionate (DEPOTESTOSTERONE CYPIONATE) 200 MG/ML injection Inject 0.3 mLs into the muscle once a week. 04/12/21   [facility]     ALLERGIES:  Allergies  Allergen Reactions  Valsartan  Other (See Comments)    diarrhea     SOCIAL HISTORY:  Social History   Socioeconomic History   Marital status: Married    Spouse name: July    Number of children: 3   Years of education: Not on file   Highest education level: Bachelor's degree (e.g., BA, AB, BS)  Occupational History   Occupation: Acupuncturist   Tobacco Use   Smoking status: Never   Smokeless tobacco: Never  Vaping Use    Vaping status: Never Used  Substance and Sexual Activity   Alcohol use: No    Comment: occasional   Drug use: No   Sexual activity: Yes    Partners: Female  Other Topics Concern   Not on file  Social History Narrative   Married, has three children,    Twin daughters still in middle school   Son is going to school at [employer]    He also has a step son    Social Drivers of Corporate investment banker Strain: Low Risk  (08/14/2022)   Overall Financial Resource Strain (CARDIA)    Difficulty of Paying Living Expenses: Not hard at all  Food Insecurity: No Food Insecurity (08/14/2022)   Hunger Vital Sign    Worried About Running Out of Food in the Last Year: Never true    Ran Out of Food in the Last Year: Never true  Transportation Needs: No Transportation Needs (08/14/2022)   PRAPARE - Administrator, Civil Service (Medical): No    Lack of Transportation (Non-Medical): No  Physical Activity: Sufficiently Active (08/14/2022)   Exercise Vital Sign    Days of Exercise per Week: 7 days    Minutes of Exercise per Session: 30 min  Stress: No Stress Concern Present (08/14/2022)   [employer] of Occupational Health - Occupational Stress Questionnaire    Feeling of Stress : Only a little  Social Connections: Socially Integrated (08/14/2022)   Social Connection and Isolation Panel [NHANES]    Frequency of Communication with Friends and Family: More than three times a week    Frequency of Social Gatherings with Friends and Family: More than three times a week    Attends Religious Services: More than 4 times per year    Active Member of [employer] or Organizations: No    Attends Engineer, structural: More than 4 times per year    Marital Status: Married  Catering manager Violence: Not At Risk (06/14/2022)   Humiliation, Afraid, Rape, and Kick questionnaire    Fear of Current or Ex-Partner: No    Emotionally Abused: No    Physically Abused: No    Sexually Abused: No      FAMILY HISTORY:  Family History  Problem Relation Age of Onset   Hypertension Mother    Diabetes Mother    Hypertension Father    Prostate cancer Father    Thyroid  disease Father    Skin cancer Sister    Diabetes Brother    Hypertension Brother    Kidney disease Neg Hx    Bladder Cancer Neg Hx     Otherwise negative.   REVIEW OF SYSTEMS:  Review of Systems  Constitutional:  Negative for chills and fever.  HENT:  Negative for congestion and sore throat.   Respiratory:  Negative for cough and shortness of breath.   Cardiovascular:  Negative for chest pain and palpitations.  Gastrointestinal:  Positive for abdominal pain and nausea. Negative for constipation, diarrhea and vomiting.  Genitourinary:  Negative for urgency.  All other systems reviewed and are negative.   VITAL SIGNS:  Temp:  [98 F (36.7 C)] 98 F (36.7 C) (01/15 0743) Pulse Rate:  [87] 87 (01/15 0743) Resp:  [18] 18 (01/15 0743) BP: (147)/(85) 147/85 (01/15 0743) SpO2:  [100 %] 100 % (01/15 0743) Weight:  [99.8 kg] 99.8 kg (01/15 0745)     Height: 6\' 1"  (185.4 cm) Weight: 99.8 kg BMI (Calculated): 29.03   PHYSICAL EXAM:  Physical Exam Vitals and nursing note reviewed. Exam conducted with a chaperone present.  Constitutional:      General: He is not in acute distress.    Appearance: He is well-developed. He is not ill-appearing.     Comments: Resting in bed; NAD  HENT:     Head: Normocephalic and atraumatic.  Eyes:     General: No scleral icterus.    Extraocular Movements: Extraocular movements intact.  Cardiovascular:     Rate and Rhythm: Normal rate.     Heart sounds: Normal heart sounds. No murmur heard. Pulmonary:     Effort: Pulmonary effort is normal. No respiratory distress.  Abdominal:     General: Abdomen is flat. There is no distension.     Palpations: Abdomen is soft.     Tenderness: There is abdominal tenderness in the right lower quadrant. Positive signs include McBurney's sign.  Negative signs include Murphy's sign and Rovsing's sign.     Comments: Abdomen is soft, tenderness to the RLQ, non-distended, no rebound/guarding   Genitourinary:    Comments: Deferred Skin:    General: Skin is warm and dry.     Coloration: Skin is not jaundiced.     Findings: No erythema.  Neurological:     General: No focal deficit present.     Mental Status: He is alert and oriented to person, place, and time.  Psychiatric:        Mood and Affect: Mood normal.        Behavior: Behavior normal.     INTAKE/OUTPUT:  This shift: No intake/output data recorded.  Last 2 shifts: @IOLAST2SHIFTS @  Labs:     Latest Ref Rng & Units 02/07/2023    7:40 AM 07/24/2022   11:41 AM 06/14/2022    9:44 AM  CBC  WBC 4.0 - 10.5 K/uL 12.8  8.7  5.6   Hemoglobin 13.0 - 17.0 g/dL 16.1  09.6  04.5   Hematocrit 39.0 - 52.0 % 45.8  49.9  49.0   Platelets 150 - 400 K/uL 196  195  204       Latest Ref Rng & Units 02/07/2023    7:40 AM 07/24/2022   11:41 AM 06/14/2022    9:44 AM  CMP  Glucose 70 - 99 mg/dL 409  811  91   BUN 8 - 23 mg/dL 27  29  19    Creatinine 0.61 - 1.24 mg/dL 9.14  7.82  9.56   Sodium 135 - 145 mmol/L 136  135  140   Potassium 3.5 - 5.1 mmol/L 3.9  3.6  4.0   Chloride 98 - 111 mmol/L 100  103  104   CO2 22 - 32 mmol/L 25  24  24    Calcium  8.9 - 10.3 mg/dL 8.9  8.7  9.0   Total Protein 6.5 - 8.1 g/dL 7.2   6.7   Total Bilirubin 0.0 - 1.2 mg/dL 0.9   0.6   Alkaline Phos 38 - 126 U/L 48  AST 15 - 41 U/L 29   31   ALT 0 - 44 U/L 25   29      Imaging studies:   CT Abdomen/Pelvis (02/06/2022) personally reviewed which shows appendicolith and changes concerning for appendicitis, and radiologist report reviewed below:  IMPRESSION: 1. Findings compatible with acute uncomplicated appendicitis. 2. 3 cm focal indeterminate lesion at the anterior interpolar left kidney. Recommend further evaluation with MRI renal mass protocol. 3. 6 mm nonobstructive stone at the inferior pole of  the left kidney. No hydronephrosis. 4. A 6 mm calcific focus in the right scrotum may represent a scrotolith. 5.  Aortic Atherosclerosis (ICD10-I70.0).   Assessment/Plan: (ICD-10's: K35.30) 63 y.o. male with acute uncomplicated appendicitis.    - Will admit to general surgery - Plan for robotic assisted laparoscopic appendectomy this afternoon with Dr Mauri Sous pending OR/Anesthesia availability  - All risks, benefits, and alternatives to above procedure(s) were discussed with the patient, all of his questions were answered to his expressed satisfaction, patient expresses he wishes to proceed, and informed consent was obtained.    - NPO for surgery (okay sips with medications); IVF support  - IV Abx (Rocephin /Flagyl )  - Monitor abdominal examination  - Pain control prn; antiemetics prn   - Mobilize as tolerated  - Hold home medications for now; okay to restart post-operatively   All of the above findings and recommendations were discussed with the patient and his family (wife at bedside), and all of their questions were answered to their expressed satisfaction.  -- Apolonio Bay, PA-C [facility] 02/07/2023, 9:51 AM M-F: 7am - 4pm

## 2023-02-07 NOTE — ED Notes (Signed)
 Patient in CT at this time.

## 2023-02-07 NOTE — Transfer of Care (Signed)
 Immediate Anesthesia Transfer of Care Note  Patient: Seth Meadows  Procedure(s) Performed: XI ROBOTIC LAPAROSCOPIC ASSISTED APPENDECTOMY (Abdomen)  Patient Location: PACU  Anesthesia Type:General  Level of Consciousness: drowsy  Airway & Oxygen Therapy: Patient Spontanous Breathing and Patient connected to face mask oxygen  Post-op Assessment: Report given to RN, Post -op Vital signs reviewed and stable, and Patient moving all extremities  Post vital signs: Reviewed and stable  Last Vitals:  Vitals Value Taken Time  BP 124/76 02/07/23 1915  Temp    Pulse 78 02/07/23 1921  Resp 13 02/07/23 1921  SpO2 98 % 02/07/23 1921  Vitals shown include unfiled device data.  Last Pain:  Vitals:   02/07/23 1619  TempSrc: Temporal  PainSc: 3          Complications: No notable events documented.

## 2023-02-07 NOTE — ED Notes (Signed)
Report given to Same Day Surgery.

## 2023-02-07 NOTE — ED Triage Notes (Signed)
 Pt via POV from home. Pt c/o generalized abd pain and nausea since 11:00pm last night. Last BM was yesterday. Denies any vomiting/diarrhea. Denies fever. Denies sick contact. Denies any abd surgeries. Pt is A&Ox4 and NAD, ambulatory to triage.

## 2023-02-07 NOTE — ED Provider Notes (Signed)
 [facility] Provider Note    Event Date/Time   First MD Initiated Contact with Patient 02/07/23 [phone]     (approximate)   History   Abdominal Pain   HPI Seth Meadows is a 63 y.o. male hypertension, hyperlipidemia, no prior surgical history presenting with right lower quadrant abdominal pain since last night.  Patient states that earlier last night he felt very bloated, had a crampy sensation all over his belly, felt nauseous but no vomiting.  Had a normal bowel movement yesterday.  States that since pain is started he has not passed any gas.  No recent trauma or falls, he is not on any blood thinners, no fever or other infectious symptoms prior to this episode.    On independent review he was seen by his cardiologist in November 2020 for for follow-up, was previously on valsartan  which caused him to have loose stools.  Otherwise has coronary artery disease, hypertension, hyperlipidemia per cardiology note.  Physical Exam   Triage Vital Signs: ED Triage Vitals  Encounter Vitals Group     BP 02/07/23 0743 (!) 147/85     Systolic BP Percentile --      Diastolic BP Percentile --      Pulse Rate 02/07/23 0743 87     Resp 02/07/23 0743 18     Temp 02/07/23 0743 98 F (36.7 C)     Temp Source 02/07/23 0743 Oral     SpO2 02/07/23 0743 100 %     Weight 02/07/23 0745 220 lb (99.8 kg)     Height 02/07/23 0745 6\' 1"  (1.854 m)     Head Circumference --      Peak Flow --      Pain Score 02/07/23 0745 8     Pain Loc --      Pain Education --      Exclude from Growth Chart --     Most recent vital signs: Vitals:   02/07/23 0743  BP: (!) 147/85  Pulse: 87  Resp: 18  Temp: 98 F (36.7 C)  SpO2: 100%    General: Awake, no distress.  CV:  Good peripheral perfusion.  Resp:  Normal effort.  Abd:  No distention.  Soft, tender at the right lower quadrant without guarding or rebound.  No right upper quadrant tenderness.  No overlying skin changes. Other:  No  CVA tenderness bilaterally, he is equal DP pulses bilaterally.   ED Results / Procedures / Treatments   Labs (all labs ordered are listed, but only abnormal results are displayed) Labs Reviewed  COMPREHENSIVE METABOLIC PANEL - Abnormal; Notable for the following components:      Result Value   Glucose, Bld 132 (*)    BUN 27 (*)    Creatinine, Ser 1.29 (*)    All other components within normal limits  CBC - Abnormal; Notable for the following components:   WBC 12.8 (*)    All other components within normal limits  URINALYSIS, ROUTINE W REFLEX MICROSCOPIC - Abnormal; Notable for the following components:   Color, Urine STRAW (*)    APPearance CLEAR (*)    All other components within normal limits  LIPASE, BLOOD        RADIOLOGY CT imaging, interpretation noted enlarged appendix.  Radiology impression:  Narrative & Impression  CLINICAL DATA:  Right lower quadrant abdominal pain.   EXAM: CT ABDOMEN AND PELVIS WITH CONTRAST   TECHNIQUE: Multidetector CT imaging of the abdomen and pelvis was  performed using the standard protocol following bolus administration of intravenous contrast.   RADIATION DOSE REDUCTION: This exam was performed according to the departmental dose-optimization program which includes automated exposure control, adjustment of the mA and/or kV according to patient size and/or use of iterative reconstruction technique.   CONTRAST:  OMNIPAQUE  IOHEXOL  300 MG/ML  SOLN   COMPARISON:  None Available.   FINDINGS: Lower chest: No acute abnormality.   Hepatobiliary: No focal liver abnormality is seen. No gallstones, gallbladder wall thickening, or biliary dilatation.   Pancreas: Unremarkable. No pancreatic ductal dilatation or surrounding inflammatory changes.   Spleen: Normal in size.  Small calcified granuloma.   Adrenals/Urinary Tract: Adrenal glands are unremarkable. Kidneys enhance symmetrically. There is a 3.0 x 2.1 x 2.2 cm  focal indeterminate slightly hyperattenuating lesion along the anterior interpolar left kidney measuring 77 Hounsfield units (series 2, image 36 and series 5, image 51). 6.7 cm exophytic cyst at the superior pole of the right kidney and an additional 1.7 cm cyst in the interpolar right kidney, for which no follow-up imaging is recommended. There is a 6 mm nonobstructive stone at the inferior pole of the left kidney. No hydronephrosis. Bladder is unremarkable.   Stomach/Bowel: Stomach is within normal limits. The appendix is dilated measuring up to 12 mm with an appendicolith noted and surrounding inflammatory change, compatible with acute appendicitis (series 2, image 50 and series 5, image 41-43). No loculated fluid collection. Redundant course of the sigmoid colon. No evidence of obstruction.   Vascular/Lymphatic: Abdominal aorta is normal in caliber. Aortic atherosclerosis. No enlarged abdominal or pelvic lymph nodes.   Reproductive: Prostatic calcifications are noted. 6 mm calcific focus within the right scrotum may represent a scrotolith.   Other: No abdominopelvic ascites.  No intraperitoneal free air.   Musculoskeletal: No acute osseous abnormality. Multilevel degenerative changes of the thoracolumbar spine.   IMPRESSION: 1. Findings compatible with acute uncomplicated appendicitis. 2. 3 cm focal indeterminate lesion at the anterior interpolar left kidney. Recommend further evaluation with MRI renal mass protocol. 3. 6 mm nonobstructive stone at the inferior pole of the left kidney. No hydronephrosis. 4. A 6 mm calcific focus in the right scrotum may represent a scrotolith. 5.  Aortic Atherosclerosis (ICD10-I70.0).      PROCEDURES:  Critical Care performed: No  Procedures   MEDICATIONS ORDERED IN ED: Medications  cefTRIAXone  (ROCEPHIN ) 1 g in sodium chloride  0.9 % 100 mL IVPB (has no administration in time range)  metroNIDAZOLE  (FLAGYL ) IVPB 500 mg (500 mg  Intravenous New Bag/Given 02/07/23 0950)  lactated ringers  bolus 1,000 mL (1,000 mLs Intravenous New Bag/Given 02/07/23 0807)  morphine  (PF) 2 MG/ML injection 2 mg (2 mg Intravenous Given 02/07/23 0806)  ondansetron  (ZOFRAN ) injection 4 mg (4 mg Intravenous Given 02/07/23 0806)  iohexol  (OMNIPAQUE ) 300 MG/ML solution 100 mL (100 mLs Intravenous Contrast Given 02/07/23 6213)     IMPRESSION / MDM / ASSESSMENT AND PLAN / ED COURSE  I reviewed the triage vital signs and the nursing notes.                              Differential diagnosis includes, but is not limited to, appendicitis, diverticulitis, colitis, constipation, early viral gastroenteritis, kidney stone, UTI.  Patient's presentation is most consistent with acute complicated illness / injury requiring diagnostic workup.  Patient with history of hypertension and hyperlipidemia no prior surgical history presenting with right lower quad abdominal pain and nausea.  Independent review of labs imaging, he is a mild leukocytosis, creatinine is mildly elevated but this is downtrending compared to prior, electrolytes and out of severely deranged, CT imaging showed acute uncomplicated appendicitis.  Consulted surgery who will come down to evaluate the patient.  Shared decision making done with patient and he is agreeable plan for admission.  Antibiotics were ordered and initiated.  Patient is NPO.  Clinical Course as of 02/07/23 1028  Wed Feb 07, 2023  0825 Creatinine(!): 1.29 Mildly elevated but improved compared to prior creatinine from 6 months ago. [TT]  0825 Lipase: 34 Normal [TT]  0826 Comprehensive metabolic panel(!) Electrolytes not severely deranged.  LFTs are not elevated. [TT]  0827 CBC(!) Mild leukocytosis, H&H is stable, platelets are normal. [TT]  0939 CT ABDOMEN PELVIS W CONTRAST Received call from radiology, patient has acute uncomplicated appendicitis. [TT]  [id number] Consulted with surgery on the phone and they will evaluate the  patient. [TT]  1027 Urinalysis, Routine w reflex microscopic -Urine, Clean Catch(!) Negative UA [TT]    Clinical Course User Index [TT] Drenda Gentle Richard Champion, MD     FINAL CLINICAL IMPRESSION(S) / ED DIAGNOSES   Final diagnoses:  Right lower quadrant abdominal pain  Acute appendicitis, unspecified acute appendicitis type  Nausea  Kidney stone  Kidney lesion  Scrotolith     Rx / DC Orders   ED Discharge Orders     None        Note:  This document was prepared using Dragon voice recognition software and may include unintentional dictation errors.    Shane Darling, MD 02/07/23 1028

## 2023-02-07 NOTE — Anesthesia Procedure Notes (Signed)
 Procedure Name: Intubation Date/Time: 02/07/2023 5:24 PM  Performed by: Orin Birk, CRNAPre-anesthesia Checklist: Patient identified, Emergency Drugs available, Suction available and Patient being monitored Patient Re-evaluated:Patient Re-evaluated prior to induction Oxygen Delivery Method: Circle system utilized Preoxygenation: Pre-oxygenation with 100% oxygen Induction Type: IV induction, Cricoid Pressure applied and Rapid sequence Laryngoscope Size: Glidescope and 4 Grade View: Grade I Tube type: Oral Tube size: 7.0 mm Number of attempts: 1 Airway Equipment and Method: Video-laryngoscopy and Stylet Placement Confirmation: ETT inserted through vocal cords under direct vision, positive ETCO2 and breath sounds checked- equal and bilateral Secured at: 22 cm Tube secured with: Tape Dental Injury: Teeth and Oropharynx as per pre-operative assessment

## 2023-02-07 NOTE — ED Provider Triage Note (Signed)
Emergency Medicine Provider Triage Evaluation Note  Seth Meadows , a 63 y.o. male  was evaluated in triage.  Pt complains of generalized abd pain starting around 11pm yesterday. Mild nausea but no vomiting or diarrhea.   Last BM was yesterday - normal.  Review of Systems  Positive: Abd pain, nausea Negative: No fever, chills, vomiting or diarrhea.   Physical Exam  There were no vitals taken for this visit. Gen:   Awake, no distress   Resp:  Normal effort Lungs Clear bilat.  MSK:   Moves extremities without difficulty  Other:  Abd soft, BS present x 4 quads  Medical Decision Making  Medically screening exam initiated at 7:42 AM.  Appropriate orders placed.  Wilbur Wesselmann was informed that the remainder of the evaluation will be completed by another provider, this initial triage assessment does not replace that evaluation, and the importance of remaining in the ED until their evaluation is complete.     Tommi Rumps, PA-C 02/07/23 [phone]

## 2023-02-08 ENCOUNTER — Encounter: Payer: Self-pay | Admitting: Surgery

## 2023-02-08 MED ORDER — IBUPROFEN 600 MG PO TABS: 600.0000 mg | ORAL_TABLET | Freq: Four times a day (QID) | ORAL | 0 refills | Status: DC | PRN

## 2023-02-08 MED ORDER — AMOXICILLIN-POT CLAVULANATE 875-125 MG PO TABS: 1.0000 | ORAL_TABLET | Freq: Two times a day (BID) | ORAL | 0 refills | Status: AC

## 2023-02-08 MED ORDER — OXYCODONE HCL 5 MG PO TABS: 5.0000 mg | ORAL_TABLET | Freq: Four times a day (QID) | ORAL | 0 refills | Status: DC | PRN

## 2023-02-08 NOTE — Discharge Summary (Signed)
[facility] SURGICAL DISCHARGE SUMMARY  Patient ID: Seth Meadows MRN: [id number] DOB/AGE: 1960/05/12 63 y.o.  Admit date: 02/07/2023 Discharge date: 02/08/2023  Discharge Diagnoses Patient Active Problem List   Diagnosis Date Noted   Acute appendicitis 02/07/2023    Consultants None  Procedures 02/06/2022:  Robotic Assisted Laparoscopic Appendectomy   HPI: 63 y.o. male presented to [facility] ED today for abdominal pain. Patient reports that last night he noticed increased abdominal distension and relatively generalized abdominal discomfort. This is morning, his pain is more localized to RLQ. Associated nausea. No fever, chills, cough, CP, SOB, emesis, or urinary changes. No history of similar. No previous intra-abdominal surgeries. Patient is on 81 mg ASA. Last seen by cardiology in November of 2024 and doing well. Echo from 06/2022 showed LVEF of 60-65%. Work up in the ED revealed a leukocytosis to 12.8K, Hgb to 15.9, sCr - 1.29 (this seems to be his baseline), no electrolyte derangements. CT Abdomen/Pelvis was obtained and concerning for acute appendicitis   Hospital Course: Informed consent was obtained and documented, and patient underwent uneventful robotic assisted laparoscopic appendectomy (Dr Aleen Campi, 02/06/2022).  Post-operatively, patient did well with improvement in his symptoms/pain. Advancement of patient's diet and ambulation were well-tolerated. The remainder of patient's hospital course was essentially unremarkable, and discharge planning was initiated accordingly with patient safely able to be discharged home with appropriate discharge instructions, antibiotics (Augmentin x7 days), pain control, and outpatient follow-up after all of his questions were answered to his expressed satisfaction.   Discharge Condition: Good   Physical Examination:  Constitutional: Well appearing male, NAD Pulmonary: Normal effort, no respiratory distress Gastrointestinal: Soft,  incisional soreness expectedly, non-distended, no rebound/guarding  Skin: Laparoscopic incisions are CDI with dermabond, no erythema or drainage    Allergies as of 02/08/2023       Reactions   Valsartan Other (See Comments)   diarrhea        Medication List     TAKE these medications    aspirin EC 81 MG tablet Take 1 tablet (81 mg total) by mouth daily. Swallow whole.   benazepril 20 MG tablet Commonly known as: LOTENSIN Take 1 tablet (20 mg total) by mouth daily.   celecoxib 100 MG capsule Commonly known as: CeleBREX Take 1 capsule (100 mg total) by mouth 2 (two) times daily.   cholecalciferol 25 MCG (1000 UNIT) tablet Commonly known as: VITAMIN D3 Take 1,000 Units by mouth daily.   ibuprofen 600 MG tablet Commonly known as: ADVIL Take 1 tablet (600 mg total) by mouth every 6 (six) hours as needed.   Monoject Safety Syringe/Shield 23G X 1" 3 ML Misc Generic drug: SYRINGE-NEEDLE (DISP) 3 ML Inject 1mL intramuscular every 10days   multivitamin with minerals tablet Take 1 tablet by mouth daily.   oxyCODONE 5 MG immediate release tablet Commonly known as: Oxy IR/ROXICODONE Take 1 tablet (5 mg total) by mouth every 6 (six) hours as needed for severe pain (pain score 7-10) or breakthrough pain.   Papav-Phentolamine-Alprostadil 150-5-50 MG-MG-MCG Solr 1 each by Intracavernosal route daily as needed (ED). Trimix   rosuvastatin 20 MG tablet Commonly known as: CRESTOR Take 1 tablet (20 mg total) by mouth daily.   tadalafil 20 MG tablet Commonly known as: CIALIS Take 20 mg by mouth daily as needed for erectile dysfunction.   testosterone cypionate 200 MG/ML injection Commonly known as: DEPOTESTOSTERONE CYPIONATE Inject 0.3 mLs into the muscle once a week.   Vitamin B-12 3000 MCG Subl Place 3,000 mcg under the  tongue every other day.          Follow-up Information     Donovan Kail, PA-C. Go on 02/21/2023.   Specialty: Physician Assistant Why: Go to  appointment on 01/29 at 215 PM Contact information: [address] [place] [address] [phone]                  Time spent on discharge management including discussion of hospital course, clinical condition, outpatient instructions, prescriptions, and follow up with the patient and members of the medical team: >30 minutes  -- Lynden Oxford , PA-C [facility]  02/08/2023, 7:53 AM [phone] M-F: 7am - 4pm

## 2023-02-08 NOTE — Discharge Instructions (Signed)
In addition to included general post-operative instructions,  Diet: Resume home diet.   Activity: No heavy lifting >20 pounds (children, pets, laundry, garbage) or strenuous activity for 4 weeks, but light activity and walking are encouraged. Do not drive or drink alcohol if taking narcotic pain medications or having pain that might distract from driving.  Wound care: 2 days after surgery (01/17), you may shower/get incision wet with soapy water and pat dry (do not rub incisions), but no baths or submerging incision underwater until follow-up.   Medications: Resume all home medications. For mild to moderate pain: acetaminophen (Tylenol) or ibuprofen/naproxen (if no kidney disease). Combining Tylenol with alcohol can substantially increase your risk of causing liver disease. Narcotic pain medications, if prescribed, can be used for severe pain, though may cause nausea, constipation, and drowsiness. Do not combine Tylenol and Percocet (or similar) within a 6 hour period as Percocet (and similar) contain(s) Tylenol. If you do not need the narcotic pain medication, you do not need to fill the prescription.  Call office [phone] / [phone]) at any time if any questions, worsening pain, fevers/chills, bleeding, drainage from incision site, or other concerns.

## 2023-02-08 NOTE — TOC CM/SW Note (Signed)
Transition of Care [facility]) - Inpatient Brief Assessment   Patient Details  Name: Seth Meadows MRN: [id number] Date of Birth: [birth year]  Transition of Care [facility]) CM/SW Contact:    Margarito Liner, LCSW Phone Number: 02/08/2023, 8:42 AM   Clinical Narrative: Patient has orders to discharge home today. Chart reviewed. No TOC needs identified. CSW signing off.  Transition of Care Asessment: Insurance and Status: Insurance coverage has been reviewed Patient has primary care physician: Yes Home environment has been reviewed: Single family home Prior level of function:: Not documented Prior/Current Home Services: No current home services Social Drivers of Health Review: SDOH reviewed no interventions necessary Readmission risk has been reviewed: Yes Transition of care needs: no transition of care needs at this time

## 2023-02-09 LAB — SURGICAL PATHOLOGY

## 2023-02-12 ENCOUNTER — Telehealth: Payer: Self-pay | Admitting: Surgery

## 2023-02-12 NOTE — Telephone Encounter (Signed)
Patient had robotic appendectomy done on 02/07/23 and for the past 24 hours his stool has been black.  Patient very concerned.  Please call him.

## 2023-02-12 NOTE — Anesthesia Preprocedure Evaluation (Signed)
Anesthesia Evaluation  Patient identified by MRN, date of birth, ID band Patient awake    Reviewed: Allergy & Precautions, H&P , NPO status , Patient's Chart, lab work & pertinent test results, reviewed documented beta blocker date and time   Airway Mallampati: II  TM Distance: >3 FB Neck ROM: full    Dental  (+) Teeth Intact   Pulmonary sleep apnea    Pulmonary exam normal        Cardiovascular Exercise Tolerance: Good hypertension, On Medications negative cardio ROS Normal cardiovascular exam Rhythm:regular Rate:Normal     Neuro/Psych negative neurological ROS  negative psych ROS   GI/Hepatic negative GI ROS, Neg liver ROS,,,  Endo/Other  negative endocrine ROS    Renal/GU negative Renal ROS  negative genitourinary   Musculoskeletal   Abdominal   Peds  Hematology negative hematology ROS (+)   Anesthesia Other Findings Past Medical History: No date: Allergic rhinitis No date: BPH with obstruction/lower urinary tract symptoms No date: Cutaneous eruption No date: Earache No date: Epididymitis, left No date: Hyperglycemia No date: Hypertension No date: Hypogonadism in male No date: Kidney function test abnormal No date: Overweight No date: Scrotal pain No date: Sleep apnea Past Surgical History: 07/04/2022: COLONOSCOPY WITH PROPOFOL; N/A     Comment:  Procedure: COLONOSCOPY WITH PROPOFOL;  Surgeon: Wyline Mood, MD;  Location: [facility] ENDOSCOPY;  Service:               Gastroenterology;  Laterality: N/A; 01/2017: EYELID LACERATION REPAIR     Comment:  cut the the eye lid to drain the infection out 07/25/2022: LEFT HEART CATH AND CORONARY ANGIOGRAPHY; N/A     Comment:  Procedure: LEFT HEART CATH AND CORONARY ANGIOGRAPHY;                Surgeon: Marykay Lex, MD;  Location: [facility] INVASIVE CV               LAB;  Service: Cardiovascular;  Laterality: N/A; No date: WISDOM TOOTH EXTRACTION 02/07/2023:  XI ROBOTIC LAPAROSCOPIC ASSISTED APPENDECTOMY; N/A     Comment:  Procedure: XI ROBOTIC LAPAROSCOPIC ASSISTED               APPENDECTOMY;  Surgeon: Henrene Dodge, MD;  Location:               [facility] ORS;  Service: General;  Laterality: N/A; BMI    Body Mass Index: 29.03 kg/m     Reproductive/Obstetrics negative OB ROS                             Anesthesia Physical Anesthesia Plan  ASA: 3  Anesthesia Plan: General ETT   Post-op Pain Management:    Induction:   PONV Risk Score and Plan: 3  Airway Management Planned:   Additional Equipment:   Intra-op Plan:   Post-operative Plan:   Informed Consent: I have reviewed the patients History and Physical, chart, labs and discussed the procedure including the risks, benefits and alternatives for the proposed anesthesia with the patient or authorized representative who has indicated his/her understanding and acceptance.     Dental Advisory Given  Plan Discussed with: CRNA  Anesthesia Plan Comments:        Anesthesia Quick Evaluation

## 2023-02-12 NOTE — Anesthesia Postprocedure Evaluation (Signed)
Anesthesia Post Note  Patient: Seth Meadows  Procedure(s) Performed: XI ROBOTIC LAPAROSCOPIC ASSISTED APPENDECTOMY (Abdomen)  Patient location during evaluation: PACU Anesthesia Type: General Level of consciousness: awake and alert Pain management: pain level controlled Vital Signs Assessment: post-procedure vital signs reviewed and stable Respiratory status: spontaneous breathing, nonlabored ventilation, respiratory function stable and patient connected to nasal cannula oxygen Cardiovascular status: blood pressure returned to baseline and stable Postop Assessment: no apparent nausea or vomiting Anesthetic complications: no   No notable events documented.   Last Vitals:  Vitals:   02/08/23 0328 02/08/23 0826  BP: (!) 133/92 125/86  Pulse: 79 93  Resp: 19 18  Temp: 36.4 C 36.7 C  SpO2: 98% 98%    Last Pain:  Vitals:   02/08/23 0826  TempSrc: Oral  PainSc:                  Yevette Edwards

## 2023-02-21 ENCOUNTER — Ambulatory Visit (INDEPENDENT_AMBULATORY_CARE_PROVIDER_SITE_OTHER): Payer: 59 | Admitting: Physician Assistant

## 2023-02-21 ENCOUNTER — Encounter: Payer: Self-pay | Admitting: Physician Assistant

## 2023-02-21 VITALS — BP 102/65 | HR 71 | Temp 98.0°F | Ht 73.0 in | Wt 223.0 lb

## 2023-02-21 DIAGNOSIS — Z09 Encounter for follow-up examination after completed treatment for conditions other than malignant neoplasm: Secondary | ICD-10-CM

## 2023-02-21 DIAGNOSIS — K3580 Unspecified acute appendicitis: Secondary | ICD-10-CM

## 2023-02-21 DIAGNOSIS — K353 Acute appendicitis with localized peritonitis, without perforation or gangrene: Secondary | ICD-10-CM

## 2023-02-21 MED ORDER — IBUPROFEN 600 MG PO TABS: 600.0000 mg | ORAL_TABLET | Freq: Four times a day (QID) | ORAL | 0 refills | Status: DC | PRN

## 2023-02-21 NOTE — Progress Notes (Signed)
[facility] POST-OP OFFICE VISIT  02/21/2023  HPI: Seth Meadows is a 63 y.o. male 14 days s/p robotic assisted laparoscopic appendectomy for acute appendicitis with Dr Aleen Campi   He has done okay Main issue right now is fatigue and upper abdominal soreness Did have diarrhea and dark stools at first but this resolved No fever, chills, nausea, emesis Incisions are healing well He is back at work; ambulating without issue  Vital signs: BP 102/65   Pulse 71   Temp 98 F (36.7 C)   Ht 6\' 1"  (1.854 m)   Wt 223 lb (101.2 kg)   SpO2 99%   BMI 29.42 kg/m    Physical Exam: Constitutional: Well appearing male, NAD Abdomen: Soft, non-tender, non-distended, no rebound/guarding Skin: Laparoscopic incisions are healing well, no erythema or drainage   Assessment/Plan: This is a 63 y.o. male 14 days s/p robotic assisted laparoscopic appendectomy for acute appendicitis with Dr Aleen Campi    - Pain control prn; Will refill ibuprofen  - Reviewed wound care recommendation  - Reviewed lifting restrictions; 4 weeks total  - Reviewed surgical pathology; Acute appendicitis  - I did offer follow up in a few weeks for re-check but he feels comfortable following as needed; He understands to call with questions/concerns  -- Lynden Oxford, PA-C [facility] 02/21/2023, 4:14 PM M-F: 7am - 4pm

## 2023-02-21 NOTE — Patient Instructions (Signed)

## 2023-03-01 ENCOUNTER — Ambulatory Visit: Payer: BC Managed Care – PPO | Admitting: Nurse Practitioner

## 2023-03-04 ENCOUNTER — Other Ambulatory Visit: Payer: Self-pay | Admitting: Family Medicine

## 2023-03-04 DIAGNOSIS — E785 Hyperlipidemia, unspecified: Secondary | ICD-10-CM

## 2023-03-06 NOTE — Telephone Encounter (Signed)
Provider not in this office

## 2023-03-06 NOTE — Telephone Encounter (Signed)
Requested medication (s) are due for refill today: yes  Requested medication (s) are on the active medication list: yes  Last refill:  12/08/22  Future visit scheduled: yes  Notes to clinic:  Unable to refill per protocol, last refill by another provider. Routing to PCP for approval.     Requested Prescriptions  Pending Prescriptions Disp Refills   rosuvastatin (CRESTOR) 20 MG tablet [Pharmacy Med Name: Rosuvastatin Calcium 20 MG Oral Tablet] 90 tablet 0    Sig: Take 1 tablet by mouth once daily     Cardiovascular:  Antilipid - Statins 2 Failed - 03/06/2023  8:22 AM      Failed - Cr in normal range and within 360 days    Creat  Date Value Ref Range Status  06/14/2022 1.14 0.70 - 1.35 mg/dL Final   Creatinine, Ser  Date Value Ref Range Status  02/07/2023 1.29 (H) 0.61 - 1.24 mg/dL Final         Failed - Lipid Panel in normal range within the last 12 months    Cholesterol, Total  Date Value Ref Range Status  11/19/2018 158 100 - 199 mg/dL Final   Cholesterol  Date Value Ref Range Status  06/14/2022 144 <200 mg/dL Final   LDL Cholesterol (Calc)  Date Value Ref Range Status  06/14/2022 82 mg/dL (calc) Final    Comment:    Reference range: <100 . Desirable range <100 mg/dL for primary prevention;   <70 mg/dL for patients with CHD or diabetic patients  with > or = 2 CHD risk factors. Marland Kitchen LDL-C is now calculated using the Martin-Hopkins  calculation, which is a validated novel method providing  better accuracy than the Friedewald equation in the  estimation of LDL-C.  Horald Pollen et al. Lenox Ahr. 1610;960(45): 2061-2068  (http://education.[employer].com/faq/FAQ164)    HDL  Date Value Ref Range Status  06/14/2022 41 > OR = 40 mg/dL Final  40/98/1191 35 (L) >39 mg/dL Final   Triglycerides  Date Value Ref Range Status  06/14/2022 117 <150 mg/dL Final         Passed - Patient is not pregnant      Passed - Valid encounter within last 12 months    Recent  Outpatient Visits           2 months ago Essential hypertension   [facility] Margarita Mail, DO   4 months ago Essential hypertension   [facility] Danelle Berry, PA-C   6 months ago Essential hypertension   [facility] Alba Cory, MD   8 months ago Well adult exam   [facility] Alba Cory, MD   1 year ago Vitamin B12 deficiency   [facility] Alba Cory, MD       Future Appointments             In 1 week Alba Cory, MD [facility]   In 4 weeks Allison Quarry, Ruby Cola, NP [facility] [place]   In 1 month Debbe Odea, MD [facility]

## 2023-03-14 ENCOUNTER — Ambulatory Visit: Payer: BC Managed Care – PPO | Admitting: Family Medicine

## 2023-03-16 ENCOUNTER — Encounter: Payer: Self-pay | Admitting: Family Medicine

## 2023-03-16 ENCOUNTER — Ambulatory Visit: Payer: 59 | Admitting: Family Medicine

## 2023-03-16 VITALS — BP 118/78 | HR 85 | Resp 16 | Ht 73.0 in | Wt 229.0 lb

## 2023-03-16 DIAGNOSIS — N2889 Other specified disorders of kidney and ureter: Secondary | ICD-10-CM | POA: Diagnosis not present

## 2023-03-16 DIAGNOSIS — I2510 Atherosclerotic heart disease of native coronary artery without angina pectoris: Secondary | ICD-10-CM | POA: Diagnosis not present

## 2023-03-16 DIAGNOSIS — G4733 Obstructive sleep apnea (adult) (pediatric): Secondary | ICD-10-CM

## 2023-03-16 DIAGNOSIS — E785 Hyperlipidemia, unspecified: Secondary | ICD-10-CM

## 2023-03-16 DIAGNOSIS — I700 Atherosclerosis of aorta: Secondary | ICD-10-CM

## 2023-03-16 DIAGNOSIS — M545 Low back pain, unspecified: Secondary | ICD-10-CM

## 2023-03-16 DIAGNOSIS — I10 Essential (primary) hypertension: Secondary | ICD-10-CM

## 2023-03-16 DIAGNOSIS — E538 Deficiency of other specified B group vitamins: Secondary | ICD-10-CM

## 2023-03-16 DIAGNOSIS — Z9049 Acquired absence of other specified parts of digestive tract: Secondary | ICD-10-CM

## 2023-03-16 NOTE — Progress Notes (Signed)
Name: Seth Meadows   MRN: [id number]    DOB: 1960-12-27   Date:03/16/2023       Progress Note  Subjective  Chief Complaint  Chief Complaint  Patient presents with   Medical Management of Chronic Issues   HPI    OSA: he is compliant with CPAP machine , he sleeps 6-7 hours per night, he wakes up feeling rested ,he had a repeat study and was given the results that it was mild OSA, he still has some episodes of waking up gasping for air and he wants to continue the CPAP machine until he loses more weight .  Hypogonadism:  seeing Urologist Dr. Britt Boozer, using Trimix injections and taking testosterone 75 mg weekly, last HCT was back to normal    Dyslipidemia: he is back on statin therapy no side effects, he is now on higher dose of Crestor, it caused some diarrhea, advised to take it at night    HTN: he is only taking lotensin 20 mg and bp at home has been below 120's , he denies orthostatic changes, no chest pain or palpitation  CAD : he has a long history of dizziness  when going up the stairs, he can go to the gym and work out for over 30 minutes without problems. He never had a syncopal episode, usually lasts a few seconds and resolves with rest. He has seen cardiologist - he had stress test and echo done in 2015, he states started after he started taking bp medication, usually after he goes up the stairs and gets to the regular flat path when he gets dizzy. It is not associated with chest pain, diaphoresis or SOB. We checked cardiac calcium score that was positive followed by cardiac cath that showed - CAD (left heart cath 07/2022 -50% proximal LAD, 80% distal LAD, minimal diffuse RCA and left circumflex disease , he is on medical management .He is up to date with follow up and labs    Vitamin D deficiency and B12 deficiency: taking supplementation as recommended  History of appendectomy: he is doing well now   Obesity: his weight was up to 273 lbs in May 2024 , he changed his diet, smaller  portions and going to the gym most days of the week and weight is down to 229 lbs   Renal mass : incidental finding during CT for appendicitis, we will check MRI  Patient Active Problem List   Diagnosis Date Noted   Acute appendicitis 02/07/2023   Adenomatous polyp of colon 07/04/2022   Vitamin B12 deficiency 11/15/2021   Elevated red blood cell count 11/15/2021   Pinched nerve in shoulder, left 10/23/2019   OSA on CPAP 02/28/2016   Vitamin D deficiency 08/31/2015   Encounter for screening colonoscopy 03/16/2015   Dyslipidemia 03/01/2015   Erectile dysfunction of organic origin 10/29/2014   Arthritis of lumbar spine 10/22/2014   Intermittent low back pain 09/21/2014   Hypogonadism in male 07/30/2014   Essential hypertension     Past Surgical History:  Procedure Laterality Date   COLONOSCOPY WITH PROPOFOL N/A 07/04/2022   Procedure: COLONOSCOPY WITH PROPOFOL;  Surgeon: Wyline Mood, MD;  Location: [facility] ENDOSCOPY;  Service: Gastroenterology;  Laterality: N/A;   EYELID LACERATION REPAIR  01/2017   cut the the eye lid to drain the infection out   LEFT HEART CATH AND CORONARY ANGIOGRAPHY N/A 07/25/2022   Procedure: LEFT HEART CATH AND CORONARY ANGIOGRAPHY;  Surgeon: Marykay Lex, MD;  Location: [facility] INVASIVE CV LAB;  Service:  Cardiovascular;  Laterality: N/A;   WISDOM TOOTH EXTRACTION     XI ROBOTIC LAPAROSCOPIC ASSISTED APPENDECTOMY N/A 02/07/2023   Procedure: XI ROBOTIC LAPAROSCOPIC ASSISTED APPENDECTOMY;  Surgeon: Henrene Dodge, MD;  Location: [facility] ORS;  Service: General;  Laterality: N/A;    Family History  Problem Relation Age of Onset   Hypertension Mother    Diabetes Mother    Hypertension Father    Prostate cancer Father    Thyroid disease Father    Skin cancer Sister    Diabetes Brother    Hypertension Brother    Kidney disease Neg Hx    Bladder Cancer Neg Hx     Social History   Tobacco Use   Smoking status: Never    Passive exposure: Never   Smokeless tobacco:  Never  Substance Use Topics   Alcohol use: No    Comment: occasional     Current Outpatient Medications:    aspirin EC 81 MG tablet, Take 1 tablet (81 mg total) by mouth daily. Swallow whole., Disp: 90 tablet, Rfl: 3   benazepril (LOTENSIN) 20 MG tablet, Take 1 tablet (20 mg total) by mouth daily., Disp: 90 tablet, Rfl: 1   celecoxib (CELEBREX) 100 MG capsule, Take 1 capsule (100 mg total) by mouth 2 (two) times daily., Disp: 60 capsule, Rfl: 0   cholecalciferol (VITAMIN D3) 25 MCG (1000 UNIT) tablet, Take 1,000 Units by mouth daily., Disp: , Rfl:    Cyanocobalamin (VITAMIN B-12) 3000 MCG SUBL, Place 3,000 mcg under the tongue every other day., Disp: , Rfl:    ibuprofen (ADVIL) 600 MG tablet, Take 1 tablet (600 mg total) by mouth every 6 (six) hours as needed., Disp: 30 tablet, Rfl: 0   Multiple Vitamins-Minerals (MULTIVITAMIN WITH MINERALS) tablet, Take 1 tablet by mouth daily., Disp: , Rfl:    Papav-Phentolamine-Alprostadil 150-5-50 MG-MG-MCG SOLR, 1 each by Intracavernosal route daily as needed (ED). Trimix, Disp: , Rfl:    rosuvastatin (CRESTOR) 20 MG tablet, Take 1 tablet by mouth once daily, Disp: 90 tablet, Rfl: 0   SYRINGE-NEEDLE, DISP, 3 ML (MONOJECT SAFETY SYRINGE/SHIELD) 23G X 1" 3 ML MISC, Inject 1mL intramuscular every 10days, Disp: , Rfl:    tadalafil (CIALIS) 20 MG tablet, Take 20 mg by mouth daily as needed for erectile dysfunction., Disp: , Rfl:    testosterone cypionate (DEPOTESTOSTERONE CYPIONATE) 200 MG/ML injection, Inject 0.3 mLs into the muscle once a week., Disp: , Rfl:   Allergies  Allergen Reactions   Valsartan Other (See Comments)    diarrhea    I personally reviewed active problem list, medication list, allergies, family history with the patient/caregiver today.   ROS  Constitutional: Negative for fever or weight change.  Respiratory: Negative for cough and shortness of breath.   Cardiovascular: Negative for chest pain or palpitations.  Gastrointestinal:  Negative for abdominal pain, no bowel changes.  Musculoskeletal: Negative for gait problem or joint swelling.  Skin: Negative for rash.  Neurological: Negative for dizziness or headache.  No other specific complaints in a complete review of systems (except as listed in HPI above).   Objective  Vitals:   03/16/23 1303  BP: 118/78  Pulse: 85  Resp: 16  SpO2: 98%  Weight: 229 lb (103.9 kg)  Height: 6\' 1"  (1.854 m)    Body mass index is 30.21 kg/m.  Physical Exam  Constitutional: Patient appears well-developed and well-nourished. Obese  No distress.  HEENT: head atraumatic, normocephalic, pupils equal and reactive to light, neck supple Cardiovascular: Normal  rate, regular rhythm and normal heart sounds.  No murmur heard. No BLE edema. Pulmonary/Chest: Effort normal and breath sounds normal. No respiratory distress. Abdominal: Soft.  There is no tenderness. Psychiatric: Patient has a normal mood and affect. behavior is normal. Judgment and thought content normal.   Recent Results (from the past 2160 hours)  Surgical pathology     Status: None   Collection Time: 02/07/23 12:00 AM  Result Value Ref Range   SURGICAL PATHOLOGY      SURGICAL PATHOLOGY [facility] [address], [address] [place], [place] [address] Telephone [phone] or [phone] Fax [phone]  REPORT OF SURGICAL PATHOLOGY   Accession #: [id number] Patient Name: HAYDON, DORRIS Visit # : [id number]  MRN: [id number] Physician: Henrene Dodge DOB/Age Aug 31, 1960 (Age: 63) Gender: M Collected Date: 02/07/2023 Received Date: 02/08/2023  FINAL DIAGNOSIS       1. Appendix, Other than Incidental,  :       - ACUTE APPENDICITIS WITH SEROSITIS AND PERIAPPENDICEAL INFLAMMATION      - FIBROUS OBLITERATION OF TIP       DATE SIGNED OUT: 02/09/2023 ELECTRONIC SIGNATURE : Corey Harold M.D., Dossie Arbour., Pathologist, Electronic Signature  MICROSCOPIC DESCRIPTION  CASE COMMENTS STAINS USED  IN DIAGNOSIS: H&E H&E    CLINICAL HISTORY  SPECIMEN(S) OBTAINED 1. Appendix, Other than Incidental,  SPECIMEN COMMENTS: SPECIMEN CLINICAL INFORMATION: 1. Acute appendicitis    Gross Description 1. Specimen: "A ppendix", intact, vermiform; received fresh placed in formalin      Size: 9.1 cm long, 0.5-1.6 cm diameter; 7.0 x 2.0 x 1.1 cm portion of attached      adipose tissue      Serosa: Gray-tan with a moderate amount of white-tan, thin exudate. The staple      line is removed and underinked black.      Mucosa: Brown-gray and predominantly flat; discrete lesions are grossly absent.      Wall: 0.1-0.2 cm thick; tan-white and homogenous.      Lumen: Contains a mild amount of pink-gray fluid and a mild amount of soft,      tan-brown fecal material with a  1.5 x 0.5 x 0.5 cm aggregate of 3 fecaliths.      The diameter is up to 1.0 cm; the lumen is grossly obliterated at the distal      end.      Block Summary: Representative sections are submitted.      1A: Proximal sections including margin (en face)      1B: Distal sections including tip (bisected, entire)      AMG 02/08/2023        Report signed out from the following location(s) [place]. [facility] [address], [place] [address] CLIA #: [id number]  [facility] [address] [place], [place] [address] CLIA #: [id number]   Lipase, blood     Status: None   Collection Time: 02/07/23  7:40 AM  Result Value Ref Range   Lipase 34 11 - 51 U/L    Comment: Performed at [facility], [address] [place], [place] [address]  Comprehensive metabolic panel     Status: Abnormal   Collection Time: 02/07/23  7:40 AM  Result Value Ref Range   Sodium 136 135 - 145 mmol/L   Potassium 3.9 3.5 - 5.1 mmol/L   Chloride 100 98 - 111 mmol/L   CO2 25 22 - 32 mmol/L   Glucose, Bld 132 (H)  70 - 99 mg/dL    Comment: Glucose reference range applies only to samples taken after fasting  for at least 8 hours.   BUN 27 (H) 8 - 23 mg/dL   Creatinine, Ser 8.11 (H) 0.61 - 1.24 mg/dL   Calcium 8.9 8.9 - 91.4 mg/dL   Total Protein 7.2 6.5 - 8.1 g/dL   Albumin 4.0 3.5 - 5.0 g/dL   AST 29 15 - 41 U/L   ALT 25 0 - 44 U/L   Alkaline Phosphatase 48 38 - 126 U/L   Total Bilirubin 0.9 0.0 - 1.2 mg/dL   GFR, Estimated >78 >29 mL/min    Comment: (NOTE) Calculated using the CKD-EPI Creatinine Equation (2021)    Anion gap 11 5 - 15    Comment: Performed at [facility], [address] [place], [place] [address]  CBC     Status: Abnormal   Collection Time: 02/07/23  7:40 AM  Result Value Ref Range   WBC 12.8 (H) 4.0 - 10.5 K/uL   RBC 5.23 4.22 - 5.81 MIL/uL   Hemoglobin 15.9 13.0 - 17.0 g/dL   HCT 08.6 57.8 - 46.9 %   MCV 87.6 80.0 - 100.0 fL   MCH 30.4 26.0 - 34.0 pg   MCHC 34.7 30.0 - 36.0 g/dL   RDW 62.9 52.8 - 41.3 %   Platelets 196 150 - 400 K/uL   nRBC 0.0 0.0 - 0.2 %    Comment: Performed at [facility], [address] [place], [place] [address]  Urinalysis, Routine w reflex microscopic -Urine, Clean Catch     Status: Abnormal   Collection Time: 02/07/23  9:00 AM  Result Value Ref Range   Color, Urine STRAW (A) YELLOW   APPearance CLEAR (A) CLEAR   Specific Gravity, Urine 1.024 1.005 - 1.030   pH 7.0 5.0 - 8.0   Glucose, UA NEGATIVE NEGATIVE mg/dL   Hgb urine dipstick NEGATIVE NEGATIVE   Bilirubin Urine NEGATIVE NEGATIVE   Ketones, ur NEGATIVE NEGATIVE mg/dL   Protein, ur NEGATIVE NEGATIVE mg/dL   Nitrite NEGATIVE NEGATIVE   Leukocytes,Ua NEGATIVE NEGATIVE    Comment: Performed at [facility], [address] [place], [place] [address]    Diabetic Foot Exam:     PHQ2/9:    12/12/2022    3:19 PM 10/12/2022   10:58 AM 08/15/2022    9:14 AM 06/14/2022    9:17 AM 11/15/2021    8:51 AM  Depression screen PHQ 2/9  Decreased Interest 0 0  0 0  Down, Depressed, Hopeless 0 0 0 0 0  PHQ - 2 Score 0 0 0 0 0  Altered sleeping 0 0  0 0 0  Tired, decreased energy 0 0 0 0 0  Change in appetite 0 0 0 0 0  Feeling bad or failure about yourself  0 0 0 0 0  Trouble concentrating 0 0 0 0 0  Moving slowly or fidgety/restless 0 0 0 0 0  Suicidal thoughts 0 0 0 0 0  PHQ-9 Score 0 0 0 0 0  Difficult doing work/chores Not difficult at all Not difficult at all       phq 9 is negative  Fall Risk:    12/12/2022    3:11 PM 10/12/2022   10:58 AM 08/15/2022    9:14 AM 06/14/2022    9:17 AM 11/15/2021    8:51 AM  Fall Risk   Falls in the past year? 0 1 0  1 0  Number falls in past yr: 0 0  0 0  Injury with Fall? 0 1  0 0  Risk for fall due to :  Impaired balance/gait No Fall Risks History of fall(s) No Fall Risks  Follow up  Falls prevention discussed;Education provided;Falls evaluation completed Falls prevention discussed Falls prevention discussed;Education provided;Falls evaluation completed Falls prevention discussed     Assessment & Plan   1. Essential hypertension (Primary)  On benazepril 20 mg, he will keep a log and show cardiologist, may be able to go down on dose to 10 mg daily   2. Renal mass, right  - MR ABDOMEN WO CONTRAST; Future  3. Coronary artery disease involving native coronary artery of native heart without angina pectoris  Under the care of cardiologist   4. Atherosclerosis of aorta (HCC)  On statin therapy   5. OSA on CPAP  Seeing pulmonologist   6. Dyslipidemia  On statin therapy   7. Intermittent low back pain  Taking Tylenol prn   8. Vitamin B12 deficiency  Continue supplementation  9. History of appendectomy  Doing well

## 2023-03-28 ENCOUNTER — Ambulatory Visit
Admission: RE | Admit: 2023-03-28 | Discharge: 2023-03-28 | Disposition: A | Discharge status: Home / Self Care | Payer: 59 | Source: Ambulatory Visit | Attending: Family Medicine | Admitting: Family Medicine

## 2023-03-28 DIAGNOSIS — N2889 Other specified disorders of kidney and ureter: Secondary | ICD-10-CM | POA: Diagnosis present

## 2023-04-04 ENCOUNTER — Ambulatory Visit: Payer: BC Managed Care – PPO | Admitting: Nurse Practitioner

## 2023-04-09 ENCOUNTER — Ambulatory Visit: Payer: BC Managed Care – PPO | Admitting: Cardiology

## 2023-04-23 ENCOUNTER — Encounter: Payer: Self-pay | Admitting: Family Medicine

## 2023-05-09 ENCOUNTER — Encounter: Payer: Self-pay | Admitting: Cardiology

## 2023-05-09 ENCOUNTER — Encounter: Payer: Self-pay | Admitting: Nurse Practitioner

## 2023-05-09 ENCOUNTER — Ambulatory Visit: Attending: Cardiology | Admitting: Cardiology

## 2023-05-09 ENCOUNTER — Ambulatory Visit (INDEPENDENT_AMBULATORY_CARE_PROVIDER_SITE_OTHER): Admitting: Nurse Practitioner

## 2023-05-09 VITALS — BP 138/90 | HR 72 | Ht 72.0 in | Wt 230.2 lb

## 2023-05-09 VITALS — BP 112/78 | HR 78 | Temp 97.7°F | Ht 73.0 in | Wt 232.6 lb

## 2023-05-09 DIAGNOSIS — G4733 Obstructive sleep apnea (adult) (pediatric): Secondary | ICD-10-CM

## 2023-05-09 DIAGNOSIS — I10 Essential (primary) hypertension: Secondary | ICD-10-CM

## 2023-05-09 DIAGNOSIS — I2510 Atherosclerotic heart disease of native coronary artery without angina pectoris: Secondary | ICD-10-CM | POA: Diagnosis not present

## 2023-05-09 DIAGNOSIS — E7800 Pure hypercholesterolemia, unspecified: Secondary | ICD-10-CM | POA: Diagnosis not present

## 2023-05-09 NOTE — Progress Notes (Unsigned)
 @Patient  ID: Seth Meadows, male    DOB: 30-Dec-1960, 63 y.o.   MRN: [id number]  Chief Complaint  Patient presents with   Follow-up    Doing well.  Using CPAP nightly.  Sleep is good.    Referring provider: Alba Cory, MD  HPI: 63 year old male, never smoker followed for moderate OSA.  Past medical history significant for OSA on CPAP, hypertension, HLD.  TEST/EVENTS:  2016 PSG: AHI 22.6/h 01/09/2023 HST: AHI 12.5, SpO2 low 84%  01/04/2023: OV with Eneida Evers NP for sleep consult.  He has a history of what he believes is severe sleep apnea diagnosed in 2016.  He has been on CPAP since this time.  He has been working on weight loss measures and is down 60 pounds over the last 2 years.  He is still sleeping with his CPAP and has not tried without it but is wanting to see if he could possibly stop using it.  He did previously have a history of snoring and some daytime fatigue symptoms.  Feels like his energy levels are good now.  Sleeps well at night.  Denies any issues with drowsy driving, morning headaches, sleep parasomnia/paralysis. He goes to bed around midnight.  Falls asleep within 10 minutes.  Wakes 1-2 times night to use the restroom.  Typically gets up around 7:30 AM.  Does not operate any heavy machinery in his job field.  Last sleep study was in 2016.  Study obtained revealed moderate sleep apnea with AHI 22.6/h.  He is on CPAP 7 cm of water.  Wearing nasal pillow mask. He has a history of hypertension, well-controlled on antihypertensives.  No history of stroke or diabetes. He is a never smoker.  Does not drink alcohol.  No excessive caffeine intake.  Lives with his wife.  He has 3 children.  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  Family history of heart disease and cancer. [place] 3 12/06/2022-01/04/2023 CPAP 7 cmH2O 29/30 days; 97% greater than 4-hour; average use 6 hours 33 minutes Leaks 95th 19.7 AHI 1  05/09/2023: Today - follow up Patient presents today for follow  up. He had repeat sleep study following significant weight loss which revealed mild OSA. He tried himself off CPAP for a little while but he felt like he wasn't as rested and his wife told him he was snoring more. He has since restarted therapy. Doing well on this. No issues. Denies drowsy driving. Energy levels are good.  04/09/2023-05/08/2023 CPAP 7 cmH2O 30/30 days; 100% >4 hr; average use 6 hr 35 min Leaks 95th 13.2 AHI 0.7  Allergies  Allergen Reactions   Valsartan Other (See Comments)    diarrhea    Immunization History  Administered Date(s) Administered   IPV 10/19/2014   Influenza Split 10/20/2013   Influenza, Seasonal, Injecte, Preservative Fre 10/19/2011   Influenza,inj,Quad PF,6+ Mos 12/06/2016, 10/12/2017, 10/16/2018, 12/05/2019, 11/11/2020   Influenza-Unspecified 02/15/2016, 10/20/2021, 09/24/2022   PFIZER(Purple Top)SARS-COV-2 Vaccination 04/02/2019, 04/29/2019, 12/05/2019   Pfizer Covid-19 Vaccine Bivalent Booster 40yrs & up 10/20/2021   Tdap 10/20/2013, 10/19/2014   Zoster Recombinant(Shingrix) 02/04/2020, 05/04/2020   Zoster, Live 10/19/2014    Past Medical History:  Diagnosis Date   Allergic rhinitis    BPH with obstruction/lower urinary tract symptoms    Cutaneous eruption    Earache    Epididymitis, left    Hyperglycemia    Hypertension    Hypogonadism in male    Kidney function test abnormal    Overweight  Scrotal pain    Sleep apnea     Tobacco History: Social History   Tobacco Use  Smoking Status Never   Passive exposure: Never  Smokeless Tobacco Never   Counseling given: Not Answered   Outpatient Medications Prior to Visit  Medication Sig Dispense Refill   aspirin EC 81 MG tablet Take 1 tablet (81 mg total) by mouth daily. Swallow whole. 90 tablet 3   benazepril (LOTENSIN) 20 MG tablet Take 1 tablet (20 mg total) by mouth daily. 90 tablet 1   cholecalciferol (VITAMIN D3) 25 MCG (1000 UNIT) tablet Take 1,000 Units by mouth daily.      Cyanocobalamin (VITAMIN B-12) 3000 MCG SUBL Place 3,000 mcg under the tongue every other day.     ibuprofen (ADVIL) 600 MG tablet Take 1 tablet (600 mg total) by mouth every 6 (six) hours as needed. 30 tablet 0   Multiple Vitamins-Minerals (MULTIVITAMIN WITH MINERALS) tablet Take 1 tablet by mouth daily.     Papav-Phentolamine-Alprostadil 150-5-50 MG-MG-MCG SOLR 1 each by Intracavernosal route daily as needed (ED). Trimix     rosuvastatin (CRESTOR) 20 MG tablet Take 1 tablet by mouth once daily 90 tablet 0   SYRINGE-NEEDLE, DISP, 3 ML (MONOJECT SAFETY SYRINGE/SHIELD) 23G X 1" 3 ML MISC Inject 1mL intramuscular every 10days     tadalafil (CIALIS) 20 MG tablet Take 20 mg by mouth daily as needed for erectile dysfunction.     testosterone cypionate (DEPOTESTOSTERONE CYPIONATE) 200 MG/ML injection Inject 0.3 mLs into the muscle once a week.     No facility-administered medications prior to visit.     Review of Systems:   Constitutional: No night sweats, fevers, chills, fatigue, or lassitude. +intentional weight loss HEENT: No headaches, difficulty swallowing, tooth/dental problems, or sore throat. No sneezing, itching, ear ache, nasal congestion, or post nasal drip CV:  No chest pain, orthopnea, PND, swelling in lower extremities, anasarca, dizziness, palpitations, syncope Resp: No shortness of breath with exertion or at rest. No excess mucus or change in color of mucus. No productive or non-productive. No hemoptysis. No wheezing.  No chest wall deformity GI:  No heartburn, indigestion GU: No nocturia Skin: No rash, lesions, ulcerations MSK:  No joint pain or swelling.   Neuro: No dizziness or lightheadedness.  Psych: No depression or anxiety. Mood stable.     Physical Exam:  BP 112/78 (BP Location: Right Arm, Patient Position: Sitting, Cuff Size: Large)   Pulse 78   Temp 97.7 F (36.5 C) (Oral)   Ht 6\' 1"  (1.854 m)   Wt 232 lb 9.6 oz (105.5 kg)   SpO2 96%   BMI 30.69 kg/m   GEN:  Pleasant, interactive, well-appearing; in no acute distress HEENT:  Normocephalic and atraumatic. PERRLA. Sclera white. Nasal turbinates pink, moist and patent bilaterally. No rhinorrhea present. Oropharynx pink and moist, without exudate or edema. No lesions, ulcerations, or postnasal drip. Mallampati II NECK:  Supple w/ fair ROM. No JVD present. Normal carotid impulses w/o bruits. Thyroid symmetrical with no goiter or nodules palpated. No lymphadenopathy.   CV: RRR, no m/r/g, no peripheral edema. Pulses intact, +2 bilaterally. No cyanosis, pallor or clubbing. PULMONARY:  Unlabored, regular breathing. Clear bilaterally A&P w/o wheezes/rales/rhonchi. No accessory muscle use.  GI: BS present and normoactive. Soft, non-tender to palpation. No organomegaly or masses detected.  MSK: No erythema, warmth or tenderness. Cap refil <2 sec all extrem. No deformities or joint swelling noted.  Neuro: A/Ox3. No focal deficits noted.   Skin: Warm, no  lesions or rashe Psych: Normal affect and behavior. Judgement and thought content appropriate.     Lab Results:  CBC    Component Value Date/Time   WBC 12.8 (H) 02/07/2023 0740   RBC 5.23 02/07/2023 0740   HGB 15.9 02/07/2023 0740   HGB 18.4 (H) 11/10/2019 0818   HCT 45.8 02/07/2023 0740   HCT 56.3 (H) 03/29/2021 0833   PLT 196 02/07/2023 0740   PLT 193 11/10/2019 0818   MCV 87.6 02/07/2023 0740   MCV 85 11/10/2019 0818   MCH 30.4 02/07/2023 0740   MCHC 34.7 02/07/2023 0740   RDW 12.3 02/07/2023 0740   RDW 15.1 11/10/2019 0818   LYMPHSABS 1,702 06/14/2022 0944   LYMPHSABS 1.7 11/10/2019 0818   MONOABS 693 07/12/2016 1201   EOSABS 179 06/14/2022 0944   EOSABS 0.2 11/10/2019 0818   BASOSABS 50 06/14/2022 0944   BASOSABS 0.1 11/10/2019 0818    BMET    Component Value Date/Time   NA 136 02/07/2023 0740   NA 139 06/03/2019 0750   K 3.9 02/07/2023 0740   CL 100 02/07/2023 0740   CO2 25 02/07/2023 0740   GLUCOSE 132 (H) 02/07/2023 0740   BUN  27 (H) 02/07/2023 0740   BUN 18 06/03/2019 0750   CREATININE 1.29 (H) 02/07/2023 0740   CREATININE 1.14 06/14/2022 0944   CALCIUM 8.9 02/07/2023 0740   GFRNONAA >60 02/07/2023 0740   GFRNONAA 63 02/04/2020 0858   GFRAA 73 02/04/2020 0858    BNP No results found for: "BNP"   Imaging:  No results found.  Administration History     None           No data to display          No results found for: "NITRICOXIDE"      Assessment & Plan:   OSA on CPAP Reduced severity of OSA with weight loss, now mild. However, he still receives benefit from use of CPAP with more restful, restorative sleep and reduction in snoring. Encouraged him to continue using nightly. Aware of proper care/use of device. Excellent compliance and control on download. Safe driving practices reviewed. Continued healthy weight management encouraged.  Patient Instructions  Continue to use CPAP every night, minimum of 4-6 hours a night.  Change equipment as directed. Wash your tubing with warm soap and water daily, hang to dry. Wash humidifier portion weekly. Use bottled, distilled water and change daily Be aware of reduced alertness and do not drive or operate heavy machinery if experiencing this or drowsiness.  Exercise encouraged, as tolerated. Healthy weight management discussed.  Avoid or decrease alcohol consumption and medications that make you more sleepy, if possible. Notify if persistent daytime sleepiness occurs even with consistent use of PAP therapy.  Follow up in 1 year with Katie Britnay Magnussen,NP, or sooner, if needed    Advised if symptoms do not improve or worsen, to please contact office for sooner follow up or seek emergency care.   I spent 25 minutes of dedicated to the care of this patient on the date of this encounter to include pre-visit review of records, face-to-face time with the patient discussing conditions above, post visit ordering of testing, clinical documentation with the  electronic health record, making appropriate referrals as documented, and communicating necessary findings to members of the patients care team.  Noemi Chapel, NP 05/10/2023  Pt aware and understands NP's role.

## 2023-05-09 NOTE — Patient Instructions (Signed)
 Medication Instructions:  Your physician recommends that you continue on your current medications as directed. Please refer to the Current Medication list given to you today.  *If you need a refill on your cardiac medications before your next appointment, please call your pharmacy*  Lab Work: NONE ordered at this time of appointment   Testing/Procedures: NONE ordered at this time of appointment    Follow-Up: At [facility], you and your health needs are our priority.  As part of our continuing mission to provide you with exceptional heart care, our providers are all part of one team.  This team includes your primary Cardiologist (physician) and Advanced Practice Providers or APPs (Physician Assistants and Nurse Practitioners) who all work together to provide you with the care you need, when you need it.  Your next appointment:   1 year(s)  Provider:   You may see Constancia Delton, MD   We recommend signing up for the patient portal called "MyChart".  Sign up information is provided on this After Visit Summary.  MyChart is used to connect with patients for Virtual Visits (Telemedicine).  Patients are able to view lab/test results, encounter notes, upcoming appointments, etc.  Non-urgent messages can be sent to your provider as well.   To learn more about what you can do with MyChart, go to [email or web address].   Other Instructions

## 2023-05-09 NOTE — Patient Instructions (Signed)
 Continue to use CPAP every night, minimum of 4-6 hours a night.  Change equipment as directed. Wash your tubing with warm soap and water daily, hang to dry. Wash humidifier portion weekly. Use bottled, distilled water and change daily Be aware of reduced alertness and do not drive or operate heavy machinery if experiencing this or drowsiness.  Exercise encouraged, as tolerated. Healthy weight management discussed.  Avoid or decrease alcohol consumption and medications that make you more sleepy, if possible. Notify if persistent daytime sleepiness occurs even with consistent use of PAP therapy.  Follow up in 1 year with Seth Lynel Forester,NP, or sooner, if needed

## 2023-05-09 NOTE — Progress Notes (Signed)
 Cardiology Office Note:    Date:  05/09/2023   ID:  Seth Meadows, DOB [birth year], MRN [id number]  PCP:  Arleen Lacer, MD  Cardiologist:  Constancia Delton, MD  Electrophysiologist:  None   Referring MD: Arleen Lacer, MD   No chief complaint on file.   History of Present Illness:    Seth Meadows is a 63 y.o. male with a hx of hypertension, CAD (left heart cath 07/2022 -50% proximal LAD, 80% distal LAD, minimal diffuse RCA and left circumflex disease), hypogonadism on testosterone, OSA on CPAP who presents for follow-up.  Feels well, denies chest pain or shortness of breath.  Deviously had symptoms of fatigue on beta-blocker, carvedilol was reduced and eventually stopped with resolution of symptoms.  Compliant with medications as prescribed.  BP typically well-controlled.   Prior notes CT cardiac score 06/2022, three-vessel coronary calcification, coronary calcium score 3449, 99th percentile, Valsartan caused loose stools. Carvedilol caused fatigue.   Past Medical History:  Diagnosis Date   Allergic rhinitis    BPH with obstruction/lower urinary tract symptoms    Cutaneous eruption    Earache    Epididymitis, left    Hyperglycemia    Hypertension    Hypogonadism in male    Kidney function test abnormal    Overweight    Scrotal pain    Sleep apnea     Past Surgical History:  Procedure Laterality Date   COLONOSCOPY WITH PROPOFOL N/A 07/04/2022   Procedure: COLONOSCOPY WITH PROPOFOL;  Surgeon: Luke Salaam, MD;  Location: [facility] ENDOSCOPY;  Service: Gastroenterology;  Laterality: N/A;   EYELID LACERATION REPAIR  01/2017   cut the the eye lid to drain the infection out   LEFT HEART CATH AND CORONARY ANGIOGRAPHY N/A 07/25/2022   Procedure: LEFT HEART CATH AND CORONARY ANGIOGRAPHY;  Surgeon: Arleen Lacer, MD;  Location: [facility] INVASIVE CV LAB;  Service: Cardiovascular;  Laterality: N/A;   WISDOM TOOTH EXTRACTION     XI ROBOTIC LAPAROSCOPIC ASSISTED APPENDECTOMY N/A 02/07/2023    Procedure: XI ROBOTIC LAPAROSCOPIC ASSISTED APPENDECTOMY;  Surgeon: Emmalene Hare, MD;  Location: [facility] ORS;  Service: General;  Laterality: N/A;    Current Medications: Current Meds  Medication Sig   aspirin EC 81 MG tablet Take 1 tablet (81 mg total) by mouth daily. Swallow whole.   benazepril (LOTENSIN) 20 MG tablet Take 1 tablet (20 mg total) by mouth daily.   cholecalciferol (VITAMIN D3) 25 MCG (1000 UNIT) tablet Take 1,000 Units by mouth daily.   Cyanocobalamin (VITAMIN B-12) 3000 MCG SUBL Place 3,000 mcg under the tongue every other day.   ibuprofen (ADVIL) 600 MG tablet Take 1 tablet (600 mg total) by mouth every 6 (six) hours as needed.   Multiple Vitamins-Minerals (MULTIVITAMIN WITH MINERALS) tablet Take 1 tablet by mouth daily.   Papav-Phentolamine-Alprostadil 150-5-50 MG-MG-MCG SOLR 1 each by Intracavernosal route daily as needed (ED). Trimix   rosuvastatin (CRESTOR) 20 MG tablet Take 1 tablet by mouth once daily   SYRINGE-NEEDLE, DISP, 3 ML (MONOJECT SAFETY SYRINGE/SHIELD) 23G X 1" 3 ML MISC Inject 1mL intramuscular every 10days   tadalafil (CIALIS) 20 MG tablet Take 20 mg by mouth daily as needed for erectile dysfunction.   testosterone cypionate (DEPOTESTOSTERONE CYPIONATE) 200 MG/ML injection Inject 0.3 mLs into the muscle once a week.     Allergies:   Valsartan   Social History   Socioeconomic History   Marital status: Married    Spouse name: July    Number of children: 3   Years  of education: Not on file   Highest education level: Bachelor's degree (e.g., BA, AB, BS)  Occupational History   Occupation: Acupuncturist   Tobacco Use   Smoking status: Never    Passive exposure: Never   Smokeless tobacco: Never  Vaping Use   Vaping status: Never Used  Substance and Sexual Activity   Alcohol use: No    Comment: occasional   Drug use: No   Sexual activity: Yes    Partners: Female  Other Topics Concern   Not on file  Social History Narrative   Married,  has three children,    Twin daughters still in middle school   Son is going to school at [employer]    He also has a step son    Social Drivers of Corporate investment banker Strain: Low Risk  (08/14/2022)   Overall Financial Resource Strain (CARDIA)    Difficulty of Paying Living Expenses: Not hard at all  Food Insecurity: No Food Insecurity (02/07/2023)   Hunger Vital Sign    Worried About Running Out of Food in the Last Year: Never true    Ran Out of Food in the Last Year: Never true  Transportation Needs: No Transportation Needs (02/07/2023)   PRAPARE - Administrator, Civil Service (Medical): No    Lack of Transportation (Non-Medical): No  Physical Activity: Sufficiently Active (08/14/2022)   Exercise Vital Sign    Days of Exercise per Week: 7 days    Minutes of Exercise per Session: 30 min  Stress: No Stress Concern Present (08/14/2022)   [employer] of Occupational Health - Occupational Stress Questionnaire    Feeling of Stress : Only a little  Social Connections: Socially Integrated (02/07/2023)   Social Connection and Isolation Panel [NHANES]    Frequency of Communication with Friends and Family: More than three times a week    Frequency of Social Gatherings with Friends and Family: More than three times a week    Attends Religious Services: More than 4 times per year    Active Member of [employer] or Organizations: No    Attends Engineer, structural: More than 4 times per year    Marital Status: Married     Family History: The patient's family history includes Diabetes in his brother and mother; Hypertension in his brother, father, and mother; Prostate cancer in his father; Skin cancer in his sister; Thyroid disease in his father. There is no history of Kidney disease or Bladder Cancer.  ROS:   Please see the history of present illness.     All other systems reviewed and are negative.  EKGs/Labs/Other Studies Reviewed:    The following studies were  reviewed today: EKG Interpretation Date/Time:  Wednesday May 09 2023 08:10:58 EDT Ventricular Rate:  71 PR Interval:  184 QRS Duration:  94 QT Interval:  370 QTC Calculation: 402 R Axis:   8  Text Interpretation: Normal sinus rhythm Possible Anterior infarct , age undetermined Confirmed by Debbe Odea ([id number]) on 05/09/2023 8:28:47 AM    Recent Labs: 06/14/2022: TSH 2.48 02/07/2023: ALT 25; BUN 27; Creatinine, Ser 1.29; Hemoglobin 15.9; Platelets 196; Potassium 3.9; Sodium 136  Recent Lipid Panel    Component Value Date/Time   CHOL 144 06/14/2022 0944   CHOL 158 11/19/2018 0803   TRIG 117 06/14/2022 0944   HDL 41 06/14/2022 0944   HDL 35 (L) 11/19/2018 0803   CHOLHDL 3.5 06/14/2022 0944   VLDL 12 07/10/2016 0835  LDLCALC 82 06/14/2022 0944    Physical Exam:    VS:  BP (!) 138/90 (BP Location: Left Arm, Patient Position: Sitting, Cuff Size: Normal)   Pulse 72   Ht 6' (1.829 m)   Wt 230 lb 3.2 oz (104.4 kg)   SpO2 98%   BMI 31.22 kg/m     Wt Readings from Last 3 Encounters:  05/09/23 230 lb 3.2 oz (104.4 kg)  03/16/23 229 lb (103.9 kg)  02/21/23 223 lb (101.2 kg)     GEN:  Well nourished, well developed in no acute distress HEENT: Normal NECK: No JVD; No carotid bruits CARDIAC: RRR, no murmurs, rubs, gallops RESPIRATORY:  Clear to auscultation without rales, wheezing or rhonchi  ABDOMEN: Soft, non-tender, non-distended MUSCULOSKELETAL:  No edema; No deformity  SKIN: Warm and dry NEUROLOGIC:  Alert and oriented x 3 PSYCHIATRIC:  Normal affect   ASSESSMENT:    1. Coronary artery disease involving native coronary artery of native heart, unspecified whether angina present   2. Primary hypertension   3. Pure hypercholesterolemia     PLAN:    In order of problems listed above:  CAD, 50% mid LAD, 80% distal LAD.  Minimal diffuse disease in RCA and left circumflex.  Denies chest pain.  Continue aspirin 81 mg, Crestor 20 mg daily.  Consider Ranexa if  angina occurs. Hypertension, BP l slightly elevated today, usually well-controlled.  continue benazepril 20. Hyperlipidemia. Continue Crestor 20 mg daily.  Follow-up in 12 months    Medication Adjustments/Labs and Tests Ordered: Current medicines are reviewed at length with the patient today.  Concerns regarding medicines are outlined above.  Orders Placed This Encounter  Procedures   EKG 12-Lead   No orders of the defined types were placed in this encounter.   Patient Instructions  Medication Instructions:  Your physician recommends that you continue on your current medications as directed. Please refer to the Current Medication list given to you today.  *If you need a refill on your cardiac medications before your next appointment, please call your pharmacy*  Lab Work: NONE ordered at this time of appointment   Testing/Procedures: NONE ordered at this time of appointment    Follow-Up: At [facility], you and your health needs are our priority.  As part of our continuing mission to provide you with exceptional heart care, our providers are all part of one team.  This team includes your primary Cardiologist (physician) and Advanced Practice Providers or APPs (Physician Assistants and Nurse Practitioners) who all work together to provide you with the care you need, when you need it.  Your next appointment:   1 year(s)  Provider:   You may see Debbe Odea, MD   We recommend signing up for the patient portal called "MyChart".  Sign up information is provided on this After Visit Summary.  MyChart is used to connect with patients for Virtual Visits (Telemedicine).  Patients are able to view lab/test results, encounter notes, upcoming appointments, etc.  Non-urgent messages can be sent to your provider as well.   To learn more about what you can do with MyChart, go to [email or web address].   Other Instructions         Signed, Debbe Odea, MD   05/09/2023 10:32 AM    [facility]

## 2023-05-10 ENCOUNTER — Encounter: Payer: Self-pay | Admitting: Nurse Practitioner

## 2023-05-10 NOTE — Assessment & Plan Note (Signed)
 Reduced severity of OSA with weight loss, now mild. However, he still receives benefit from use of CPAP with more restful, restorative sleep and reduction in snoring. Encouraged him to continue using nightly. Aware of proper care/use of device. Excellent compliance and control on download. Safe driving practices reviewed. Continued healthy weight management encouraged.  Patient Instructions  Continue to use CPAP every night, minimum of 4-6 hours a night.  Change equipment as directed. Wash your tubing with warm soap and water daily, hang to dry. Wash humidifier portion weekly. Use bottled, distilled water and change daily Be aware of reduced alertness and do not drive or operate heavy machinery if experiencing this or drowsiness.  Exercise encouraged, as tolerated. Healthy weight management discussed.  Avoid or decrease alcohol consumption and medications that make you more sleepy, if possible. Notify if persistent daytime sleepiness occurs even with consistent use of PAP therapy.  Follow up in 1 year with Katie Azarius Lambson,NP, or sooner, if needed

## 2023-06-20 ENCOUNTER — Encounter: Payer: Self-pay | Admitting: Family Medicine

## 2023-06-20 ENCOUNTER — Ambulatory Visit: Payer: 59 | Admitting: Family Medicine

## 2023-06-20 VITALS — BP 124/86 | HR 90 | Resp 16 | Ht 71.56 in | Wt 226.7 lb

## 2023-06-20 DIAGNOSIS — E785 Hyperlipidemia, unspecified: Secondary | ICD-10-CM | POA: Diagnosis not present

## 2023-06-20 DIAGNOSIS — Z23 Encounter for immunization: Secondary | ICD-10-CM

## 2023-06-20 DIAGNOSIS — E291 Testicular hypofunction: Secondary | ICD-10-CM

## 2023-06-20 DIAGNOSIS — Z125 Encounter for screening for malignant neoplasm of prostate: Secondary | ICD-10-CM

## 2023-06-20 DIAGNOSIS — Z0000 Encounter for general adult medical examination without abnormal findings: Secondary | ICD-10-CM

## 2023-06-20 DIAGNOSIS — E790 Hyperuricemia without signs of inflammatory arthritis and tophaceous disease: Secondary | ICD-10-CM

## 2023-06-20 DIAGNOSIS — E538 Deficiency of other specified B group vitamins: Secondary | ICD-10-CM | POA: Diagnosis not present

## 2023-06-20 DIAGNOSIS — E559 Vitamin D deficiency, unspecified: Secondary | ICD-10-CM | POA: Diagnosis not present

## 2023-06-20 DIAGNOSIS — R718 Other abnormality of red blood cells: Secondary | ICD-10-CM

## 2023-06-20 DIAGNOSIS — Z0001 Encounter for general adult medical examination with abnormal findings: Secondary | ICD-10-CM

## 2023-06-20 DIAGNOSIS — Z131 Encounter for screening for diabetes mellitus: Secondary | ICD-10-CM

## 2023-06-20 NOTE — Progress Notes (Signed)
 Name: Seth Meadows   MRN: [id number]    DOB: 1960/02/15   Date:06/20/2023       Progress Note  Subjective  Chief Complaint  Chief Complaint  Patient presents with   Annual Exam    HPI  Patient presents for annual CPE .   IPSS     Row Name 06/20/23 0920         International Prostate Symptom Score   How often have you had the sensation of not emptying your bladder? Not at All     How often have you had to urinate less than every two hours? Not at All     How often have you found you stopped and started again several times when you urinated? Not at All     How often have you found it difficult to postpone urination? Not at All     How often have you had a weak urinary stream? Not at All     How often have you had to strain to start urination? Not at All     How many times did you typically get up at night to urinate? 1 Time     Total IPSS Score 1       Quality of Life due to urinary symptoms   If you were to spend the rest of your life with your urinary condition just the way it is now how would you feel about that? Pleased              Diet: low salt diet  Exercise: continue regular physical activity - plays volleyball in a league at the [facility] Spring and Fall also plays with friend during the Summer, also goes to the gym for elliptical, weights and goes for walks  Last Dental Exam: up to date  Last Eye Exam: up to date   Depression: phq 9 is negative    06/20/2023    9:16 AM 12/12/2022    3:19 PM 10/12/2022   10:58 AM 08/15/2022    9:14 AM 06/14/2022    9:17 AM  Depression screen PHQ 2/9  Decreased Interest 0 0 0  0  Down, Depressed, Hopeless 0 0 0 0 0  PHQ - 2 Score 0 0 0 0 0  Altered sleeping  0 0 0 0  Tired, decreased energy  0 0 0 0  Change in appetite  0 0 0 0  Feeling bad or failure about yourself   0 0 0 0  Trouble concentrating  0 0 0 0  Moving slowly or fidgety/restless  0 0 0 0  Suicidal thoughts  0 0 0 0  PHQ-9 Score  0 0 0 0  Difficult doing  work/chores  Not difficult at all Not difficult at all      Hypertension:  BP Readings from Last 3 Encounters:  06/20/23 124/86  05/09/23 112/78  05/09/23 (!) 138/90    Obesity: Wt Readings from Last 3 Encounters:  06/20/23 226 lb 11.2 oz (102.8 kg)  05/09/23 232 lb 9.6 oz (105.5 kg)  05/09/23 230 lb 3.2 oz (104.4 kg)   BMI Readings from Last 3 Encounters:  06/20/23 31.13 kg/m  05/09/23 30.69 kg/m  05/09/23 31.22 kg/m     [employer] Visit from 06/20/2023 in [facility]  AUDIT-C Score 0       Married STD testing and prevention (HIV/chl/gon/syphilis):  not applicable Sexual history: seeing Urologist , takes Cialis, also has the Trimix , also  taking testosterone   Hep C Screening: completed Skin cancer: Discussed monitoring for atypical lesions Colorectal cancer: repeat in 2034  Prostate cancer:  yes Lab Results  Component Value Date   PSA 1.15 06/14/2022     Lung cancer:  Low Dose CT Chest recommended if Age 2-80 years, 30 pack-year currently smoking OR have quit w/in 15years. Patient  is not a candidate for screening   AAA: The USPSTF recommends one-time screening with ultrasonography in men ages 74 to 75 years who have ever smoked. Patient   is not a candidate for screening  ECG:  under the care of cardiologist   Vaccines: reviewed with the patient. PCV 20 today   Advanced Care Planning: A voluntary discussion about advance care planning including the explanation and discussion of advance directives.  Discussed health care proxy and Living will, and the patient was able to identify a health care proxy as wife .  Patient does not have a living will and power of attorney of health care   Patient Active Problem List   Diagnosis Date Noted   Adenomatous polyp of colon 07/04/2022   Vitamin B12 deficiency 11/15/2021   Elevated red blood cell count 11/15/2021   Pinched nerve in shoulder, left 10/23/2019   OSA on CPAP 02/28/2016    Vitamin D  deficiency 08/31/2015   Dyslipidemia 03/01/2015   Erectile dysfunction of organic origin 10/29/2014   Arthritis of lumbar spine 10/22/2014   Intermittent low back pain 09/21/2014   Hypogonadism in male 07/30/2014   Essential hypertension     Past Surgical History:  Procedure Laterality Date   COLONOSCOPY WITH PROPOFOL  N/A 07/04/2022   Procedure: COLONOSCOPY WITH PROPOFOL ;  Surgeon: Luke Salaam, MD;  Location: [facility] ENDOSCOPY;  Service: Gastroenterology;  Laterality: N/A;   EYELID LACERATION REPAIR  01/2017   cut the the eye lid to drain the infection out   LEFT HEART CATH AND CORONARY ANGIOGRAPHY N/A 07/25/2022   Procedure: LEFT HEART CATH AND CORONARY ANGIOGRAPHY;  Surgeon: Arleen Lacer, MD;  Location: [facility] INVASIVE CV LAB;  Service: Cardiovascular;  Laterality: N/A;   WISDOM TOOTH EXTRACTION     XI ROBOTIC LAPAROSCOPIC ASSISTED APPENDECTOMY N/A 02/07/2023   Procedure: XI ROBOTIC LAPAROSCOPIC ASSISTED APPENDECTOMY;  Surgeon: Emmalene Hare, MD;  Location: [facility] ORS;  Service: General;  Laterality: N/A;    Family History  Problem Relation Age of Onset   Hypertension Mother    Diabetes Mother    Hypertension Father    Prostate cancer Father    Thyroid  disease Father    Skin cancer Sister    Diabetes Brother    Hypertension Brother    Kidney disease Neg Hx    Bladder Cancer Neg Hx     Social History   Socioeconomic History   Marital status: Married    Spouse name: July    Number of children: 3   Years of education: Not on file   Highest education level: Bachelor's degree (e.g., BA, AB, BS)  Occupational History   Occupation: Acupuncturist   Tobacco Use   Smoking status: Never    Passive exposure: Never   Smokeless tobacco: Never  Vaping Use   Vaping status: Never Used  Substance and Sexual Activity   Alcohol use: No    Comment: occasional   Drug use: No   Sexual activity: Yes    Partners: Female  Other Topics Concern   Not on file  Social History  Narrative   Married, has three children,  Twin daughters still in middle school   Son is going to school at [employer]    He also has a step son    Social Drivers of Corporate investment banker Strain: Low Risk  (06/20/2023)   Overall Financial Resource Strain (CARDIA)    Difficulty of Paying Living Expenses: Not hard at all  Food Insecurity: No Food Insecurity (02/07/2023)   Hunger Vital Sign    Worried About Running Out of Food in the Last Year: Never true    Ran Out of Food in the Last Year: Never true  Transportation Needs: No Transportation Needs (02/07/2023)   PRAPARE - Administrator, Civil Service (Medical): No    Lack of Transportation (Non-Medical): No  Physical Activity: Sufficiently Active (06/20/2023)   Exercise Vital Sign    Days of Exercise per Week: 5 days    Minutes of Exercise per Session: 30 min  Stress: No Stress Concern Present (06/20/2023)   [employer] of Occupational Health - Occupational Stress Questionnaire    Feeling of Stress : Only a little  Social Connections: Socially Integrated (02/07/2023)   Social Connection and Isolation Panel [NHANES]    Frequency of Communication with Friends and Family: More than three times a week    Frequency of Social Gatherings with Friends and Family: More than three times a week    Attends Religious Services: More than 4 times per year    Active Member of [employer] or Organizations: No    Attends Engineer, structural: More than 4 times per year    Marital Status: Married  Catering manager Violence: Not At Risk (02/07/2023)   Humiliation, Afraid, Rape, and Kick questionnaire    Fear of Current or Ex-Partner: No    Emotionally Abused: No    Physically Abused: No    Sexually Abused: No     Current Outpatient Medications:    aspirin  EC 81 MG tablet, Take 1 tablet (81 mg total) by mouth daily. Swallow whole., Disp: 90 tablet, Rfl: 3   benazepril  (LOTENSIN ) 20 MG tablet, Take 1 tablet (20 mg total) by mouth  daily., Disp: 90 tablet, Rfl: 1   cholecalciferol (VITAMIN D3) 25 MCG (1000 UNIT) tablet, Take 1,000 Units by mouth daily., Disp: , Rfl:    Cyanocobalamin (VITAMIN B-12) 3000 MCG SUBL, Place 3,000 mcg under the tongue every other day., Disp: , Rfl:    ibuprofen  (ADVIL ) 600 MG tablet, Take 1 tablet (600 mg total) by mouth every 6 (six) hours as needed., Disp: 30 tablet, Rfl: 0   Multiple Vitamins-Minerals (MULTIVITAMIN WITH MINERALS) tablet, Take 1 tablet by mouth daily., Disp: , Rfl:    Papav-Phentolamine-Alprostadil 150-5-50 MG-MG-MCG SOLR, 1 each by Intracavernosal route daily as needed (ED). Trimix, Disp: , Rfl:    rosuvastatin  (CRESTOR ) 20 MG tablet, Take 1 tablet by mouth once daily, Disp: 90 tablet, Rfl: 0   SYRINGE-NEEDLE, DISP, 3 ML (MONOJECT SAFETY SYRINGE/SHIELD) 23G X 1" 3 ML MISC, Inject 1mL intramuscular every 10days, Disp: , Rfl:    tadalafil (CIALIS) 20 MG tablet, Take 20 mg by mouth daily as needed for erectile dysfunction., Disp: , Rfl:    testosterone  cypionate (DEPOTESTOSTERONE CYPIONATE) 200 MG/ML injection, Inject 0.3 mLs into the muscle once a week., Disp: , Rfl:    valACYclovir (VALTREX) 1000 MG tablet, Take 4,000 mg by mouth once., Disp: , Rfl:   Allergies  Allergen Reactions   Valsartan  Other (See Comments)    diarrhea     ROS  Constitutional: Negative for fever or weight change.  Respiratory: Negative for cough and shortness of breath.   Cardiovascular: Negative for chest pain or palpitations.  Gastrointestinal: Negative for abdominal pain, no bowel changes. He has a lipoma removed from right flank in 2023 and has noticed some recurrence of pain intermittently, advised to follow up with surgeon  Musculoskeletal: Negative for gait problem or joint swelling.  Skin: Negative for rash.  Neurological: Negative for dizziness or headache.  No other specific complaints in a complete review of systems (except as listed in HPI above).    Objective  Vitals:   06/20/23  0922  BP: 124/86  Pulse: 90  Resp: 16  SpO2: 97%  Weight: 226 lb 11.2 oz (102.8 kg)  Height: 5' 11.56" (1.818 m)    Body mass index is 31.13 kg/m.  Physical Exam  Constitutional: Patient appears well-developed and well-nourished. No distress.  HENT: Head: Normocephalic and atraumatic. Ears: B TMs ok, no erythema or effusion; Nose: Nose normal. Mouth/Throat: Oropharynx is clear and moist. No oropharyngeal exudate.  Eyes: Conjunctivae and EOM are normal. Pupils are equal, round, and reactive to light. No scleral icterus.  Neck: Normal range of motion. Neck supple. No JVD present. No thyromegaly present.  Cardiovascular: Normal rate, regular rhythm and normal heart sounds.  No murmur heard. No BLE edema. Pulmonary/Chest: Effort normal and breath sounds normal. No respiratory distress. Abdominal: Soft. Bowel sounds are normal, no distension. There is no tenderness. no masses MALE GENITALIA:not done sees Urologist  RECTAL: Prostate normal size and consistency, no rectal masses or hemorrhoids Musculoskeletal: Normal range of motion, no joint effusions. No gross deformities Neurological: he is alert and oriented to person, place, and time. No cranial nerve deficit. Coordination, balance, strength, speech and gait are normal.  Skin: Skin is warm and dry. No rash noted. No erythema.  Psychiatric: Patient has a normal mood and affect. behavior is normal. Judgment and thought content normal.     Assessment & Plan  1. Well adult exam (Primary)  - Lipid panel - CBC with Differential/Platelet - Comprehensive metabolic panel with GFR - Hemoglobin A1c - PSA - Estradiol  - Uric acid - VITAMIN D  25 Hydroxy (Vit-D Deficiency, Fractures) - Testosterone  , Free and Total  2. Immunization due  - Pneumococcal conjugate vaccine 20-valent (Prevnar 20)  3. Vitamin D  deficiency  - VITAMIN D  25 Hydroxy (Vit-D Deficiency, Fractures)  4. Vitamin B12 deficiency  - CBC with  Differential/Platelet - Vitamin B12  5. Dyslipidemia  - Lipid panel  6. Elevated uric acid in blood  - Uric acid  7. Diabetes mellitus screening  - Hemoglobin A1c  8. Hypogonadism in male  - PSA - Estradiol  - Testosterone  , Free and Total  9. Elevated red blood cell count  - CBC with Differential/Platelet  10. Prostate cancer screening  - PSA    -Prostate cancer screening and PSA options (with potential risks and benefits of testing vs not testing) were discussed along with recent recs/guidelines. -USPSTF grade A and B recommendations reviewed with patient; age-appropriate recommendations, preventive care, screening tests, etc discussed and encouraged; healthy living encouraged; see AVS for patient education given to patient -Discussed importance of 150 minutes of physical activity weekly, eat two servings of fish weekly, eat one serving of tree nuts ( cashews, pistachios, pecans, almonds.Aaron Aas) every other day, eat 6 servings of fruit/vegetables daily and drink plenty of water and avoid sweet beverages.  -Reviewed Health Maintenance: yes

## 2023-06-22 ENCOUNTER — Ambulatory Visit: Payer: Self-pay | Admitting: Family Medicine

## 2023-06-26 LAB — COMPREHENSIVE METABOLIC PANEL WITH GFR
AG Ratio: 1.6 (calc) (ref 1.0–2.5)
ALT: 28 U/L (ref 9–46)
AST: 28 U/L (ref 10–35)
Albumin: 4.2 g/dL (ref 3.6–5.1)
Alkaline phosphatase (APISO): 52 U/L (ref 35–144)
BUN/Creatinine Ratio: 18 (calc) (ref 6–22)
BUN: 28 mg/dL — ABNORMAL HIGH (ref 7–25)
CO2: 25 mmol/L (ref 20–32)
Calcium: 9.2 mg/dL (ref 8.6–10.3)
Chloride: 105 mmol/L (ref 98–110)
Creat: 1.53 mg/dL — ABNORMAL HIGH (ref 0.70–1.35)
Globulin: 2.7 g/dL (ref 1.9–3.7)
Glucose, Bld: 95 mg/dL (ref 65–99)
Potassium: 4.4 mmol/L (ref 3.5–5.3)
Sodium: 138 mmol/L (ref 135–146)
Total Bilirubin: 0.6 mg/dL (ref 0.2–1.2)
Total Protein: 6.9 g/dL (ref 6.1–8.1)
eGFR: 50 mL/min/{1.73_m2} — ABNORMAL LOW (ref >=60)

## 2023-06-26 LAB — CBC WITH DIFFERENTIAL/PLATELET
Absolute Lymphocytes: 1830 {cells}/uL (ref 850–3900)
Absolute Monocytes: 547 {cells}/uL (ref 200–950)
Basophils Absolute: 57 {cells}/uL (ref 0–200)
Basophils Relative: 1 %
Eosinophils Absolute: 160 {cells}/uL (ref 15–500)
Eosinophils Relative: 2.8 %
HCT: 50.8 % — ABNORMAL HIGH (ref 38.5–50.0)
Hemoglobin: 16.7 g/dL (ref 13.2–17.1)
MCH: 29.2 pg (ref 27.0–33.0)
MCHC: 32.9 g/dL (ref 32.0–36.0)
MCV: 89 fL (ref 80.0–100.0)
MPV: 10.9 fL (ref 7.5–12.5)
Monocytes Relative: 9.6 %
Neutro Abs: 3107 {cells}/uL (ref 1500–7800)
Neutrophils Relative %: 54.5 %
Platelets: 160 10*3/uL (ref 140–400)
RBC: 5.71 10*6/uL (ref 4.20–5.80)
RDW: 13.2 % (ref 11.0–15.0)
Total Lymphocyte: 32.1 %
WBC: 5.7 10*3/uL (ref 3.8–10.8)

## 2023-06-26 LAB — URIC ACID: Uric Acid, Serum: 6.2 mg/dL (ref 4.0–8.0)

## 2023-06-26 LAB — TESTOSTERONE, FREE & TOTAL
Free Testosterone: 42.2 pg/mL (ref 35.0–155.0)
Testosterone, Total, LC-MS-MS: 287 ng/dL (ref 250–1100)

## 2023-06-26 LAB — VITAMIN D 25 HYDROXY (VIT D DEFICIENCY, FRACTURES): Vit D, 25-Hydroxy: 56 ng/mL (ref 30–100)

## 2023-06-26 LAB — LIPID PANEL
Cholesterol: 131 mg/dL (ref <200)
HDL: 48 mg/dL (ref >=40)
LDL Cholesterol (Calc): 69 mg/dL
Non-HDL Cholesterol (Calc): 83 mg/dL (ref <130)
Total CHOL/HDL Ratio: 2.7 (calc) (ref <5.0)
Triglycerides: 64 mg/dL (ref <150)

## 2023-06-26 LAB — ESTRADIOL: Estradiol: 27 pg/mL (ref <=39)

## 2023-06-26 LAB — HEMOGLOBIN A1C
Hgb A1c MFr Bld: 5.4 % (ref <5.7)
Mean Plasma Glucose: 108 mg/dL
eAG (mmol/L): 6 mmol/L

## 2023-06-26 LAB — VITAMIN B12: Vitamin B-12: 780 pg/mL (ref 200–1100)

## 2023-06-26 LAB — PSA: PSA: 1.19 ng/mL (ref <=4.00)

## 2023-06-27 ENCOUNTER — Other Ambulatory Visit: Payer: Self-pay

## 2023-06-27 ENCOUNTER — Other Ambulatory Visit: Payer: Self-pay | Admitting: Internal Medicine

## 2023-06-27 DIAGNOSIS — I10 Essential (primary) hypertension: Secondary | ICD-10-CM

## 2023-06-27 MED ORDER — BENAZEPRIL HCL 20 MG PO TABS: 20.0000 mg | ORAL_TABLET | Freq: Every day | ORAL | 0 refills | Status: DC

## 2023-06-28 NOTE — Telephone Encounter (Signed)
 Refilled 06/27/23 # 90. Requested Prescriptions  Refused Prescriptions Disp Refills   benazepril  (LOTENSIN ) 20 MG tablet [Pharmacy Med Name: Benazepril  HCl 20 MG Oral Tablet] 90 tablet 0    Sig: Take 1 tablet by mouth once daily     Cardiovascular:  ACE Inhibitors Failed - 06/28/2023 10:42 AM      Failed - Cr in normal range and within 180 days    Creat  Date Value Ref Range Status  06/21/2023 1.53 (H) 0.70 - 1.35 mg/dL Final         Passed - K in normal range and within 180 days    Potassium  Date Value Ref Range Status  06/21/2023 4.4 3.5 - 5.3 mmol/L Final         Passed - Patient is not pregnant      Passed - Last BP in normal range    BP Readings from Last 1 Encounters:  06/20/23 124/86         Passed - Valid encounter within last 6 months    Recent Outpatient Visits           1 week ago Well adult exam   [facility] Arleen Lacer, MD   3 months ago Essential hypertension   [facility] Arleen Lacer, MD       Future Appointments             In 2 months Sowles, Krichna, MD [facility]   In 11 months Sowles, Krichna, MD [facility], [facility]

## 2023-08-30 ENCOUNTER — Ambulatory Visit: Payer: 59 | Admitting: Family Medicine

## 2023-09-05 ENCOUNTER — Other Ambulatory Visit: Payer: Self-pay | Admitting: Family Medicine

## 2023-09-05 DIAGNOSIS — I10 Essential (primary) hypertension: Secondary | ICD-10-CM

## 2023-09-05 DIAGNOSIS — E785 Hyperlipidemia, unspecified: Secondary | ICD-10-CM

## 2023-09-09 ENCOUNTER — Other Ambulatory Visit: Payer: Self-pay | Admitting: Family Medicine

## 2023-09-09 DIAGNOSIS — I10 Essential (primary) hypertension: Secondary | ICD-10-CM

## 2023-09-10 ENCOUNTER — Other Ambulatory Visit: Payer: Self-pay | Admitting: Family Medicine

## 2023-09-10 DIAGNOSIS — I10 Essential (primary) hypertension: Secondary | ICD-10-CM

## 2023-09-21 ENCOUNTER — Ambulatory Visit: Admitting: Family Medicine

## 2023-09-21 ENCOUNTER — Other Ambulatory Visit: Payer: Self-pay | Admitting: Family Medicine

## 2023-09-21 DIAGNOSIS — I10 Essential (primary) hypertension: Secondary | ICD-10-CM

## 2023-10-09 ENCOUNTER — Encounter: Payer: Self-pay | Admitting: Family Medicine

## 2023-10-09 ENCOUNTER — Ambulatory Visit (INDEPENDENT_AMBULATORY_CARE_PROVIDER_SITE_OTHER): Admitting: Family Medicine

## 2023-10-09 VITALS — BP 136/84 | HR 90 | Resp 16 | Ht 71.56 in | Wt 242.9 lb

## 2023-10-09 DIAGNOSIS — I10 Essential (primary) hypertension: Secondary | ICD-10-CM

## 2023-10-09 DIAGNOSIS — I700 Atherosclerosis of aorta: Secondary | ICD-10-CM

## 2023-10-09 DIAGNOSIS — N2889 Other specified disorders of kidney and ureter: Secondary | ICD-10-CM

## 2023-10-09 DIAGNOSIS — Z23 Encounter for immunization: Secondary | ICD-10-CM | POA: Diagnosis not present

## 2023-10-09 DIAGNOSIS — E785 Hyperlipidemia, unspecified: Secondary | ICD-10-CM

## 2023-10-09 DIAGNOSIS — I2510 Atherosclerotic heart disease of native coronary artery without angina pectoris: Secondary | ICD-10-CM

## 2023-10-09 DIAGNOSIS — E559 Vitamin D deficiency, unspecified: Secondary | ICD-10-CM

## 2023-10-09 DIAGNOSIS — G4733 Obstructive sleep apnea (adult) (pediatric): Secondary | ICD-10-CM

## 2023-10-09 DIAGNOSIS — G8929 Other chronic pain: Secondary | ICD-10-CM | POA: Diagnosis not present

## 2023-10-09 DIAGNOSIS — E538 Deficiency of other specified B group vitamins: Secondary | ICD-10-CM

## 2023-10-09 DIAGNOSIS — M5442 Lumbago with sciatica, left side: Secondary | ICD-10-CM

## 2023-10-09 MED ORDER — CELECOXIB 100 MG PO CAPS: 100.0000 mg | ORAL_CAPSULE | Freq: Two times a day (BID) | ORAL | 0 refills | Status: DC

## 2023-10-09 MED ORDER — BENAZEPRIL HCL 20 MG PO TABS: 20.0000 mg | ORAL_TABLET | Freq: Every day | ORAL | 1 refills | Status: AC

## 2023-10-09 MED ORDER — COVID-19 MRNA VAC-TRIS(PFIZER) 30 MCG/0.3ML IM SUSY: 0.3000 mL | PREFILLED_SYRINGE | Freq: Once | INTRAMUSCULAR | 0 refills | Status: AC

## 2023-10-09 MED ORDER — ROSUVASTATIN CALCIUM 20 MG PO TABS: 20.0000 mg | ORAL_TABLET | Freq: Every day | ORAL | 1 refills | Status: AC

## 2023-10-09 MED ORDER — METAXALONE 800 MG PO TABS: 800.0000 mg | ORAL_TABLET | Freq: Three times a day (TID) | ORAL | 1 refills | Status: AC | PRN

## 2023-10-09 NOTE — Progress Notes (Signed)
 Name: Seth Meadows   MRN: [id number]    DOB: 17-Mar-1960   Date:10/09/2023       Progress Note  Subjective  Chief Complaint  Chief Complaint  Patient presents with   Medical Management of Chronic Issues   Discussed the use of AI scribe software for clinical note transcription with the patient, who gave verbal consent to proceed.  History of Present Illness Seth Meadows is a 63 year old male with chronic low back pain who presents for follow-up regarding his back pain.  He has experienced persistent low back pain throughout the summer, with fluctuating intensity. The pain is primarily located in the lower back, with recent sharp pain on the left side, whereas it was previously more pronounced on the right side. He describes the pain as 'really tight and painful all across the back,' particularly at the bottom of the lower back.  The pain occasionally radiates down the left leg to the lateral thigh, but not to the calf or foot. It is exacerbated by changing positions, such as sitting to standing, and bending over. The pain is particularly intense when he first stands up, described as 'like getting stabbed in the back,' with a pain level of eight or nine out of ten. However, it subsides after moving around for a while. No pain is experienced while sitting still or during sleep.  He denies saddle anesthesia , bowel or bladder incontinence. He has not been taking any medication for inflammation regularly, though he has used Advil  occasionally for headaches. He has muscle relaxers from a previous prescription but is unsure of the type.  He was previously seen by Dr Tobie ( ortho/sports medicine) and has OA lumbar spine particularly in the left sacroiliac joint, and multilevel narrowing with facet hypertrophy changes. He previously underwent physical therapy earlier in the year, which he believes helped improve his condition, though he has not continued the exercises at home.   He has HTN and is well  managed with Lotensin  20 mg, he denies chest pain or palpitation and is compliant with medication. BP usually a little higher in the mornings  Dyslipidemia/Atherosclerosis of Aorta/CAD , taking crestor  20 mg and denies myalgia, last LDL at goal   Vitamin B12 and D deficiency , taking otc supplementation   ED and low testosterone  , under the care of Urologist   Patient Active Problem List   Diagnosis Date Noted   Adenomatous polyp of colon 07/04/2022   Vitamin B12 deficiency 11/15/2021   Elevated red blood cell count 11/15/2021   Pinched nerve in shoulder, left 10/23/2019   OSA on CPAP 02/28/2016   Vitamin D  deficiency 08/31/2015   Dyslipidemia 03/01/2015   Erectile dysfunction of organic origin 10/29/2014   Arthritis of lumbar spine 10/22/2014   Intermittent low back pain 09/21/2014   Hypogonadism in male 07/30/2014   Essential hypertension     Past Surgical History:  Procedure Laterality Date   COLONOSCOPY WITH PROPOFOL  N/A 07/04/2022   Procedure: COLONOSCOPY WITH PROPOFOL ;  Surgeon: Therisa Bi, MD;  Location: [facility] ENDOSCOPY;  Service: Gastroenterology;  Laterality: N/A;   EYELID LACERATION REPAIR  01/2017   cut the the eye lid to drain the infection out   LEFT HEART CATH AND CORONARY ANGIOGRAPHY N/A 07/25/2022   Procedure: LEFT HEART CATH AND CORONARY ANGIOGRAPHY;  Surgeon: Anner Alm ORN, MD;  Location: [facility] INVASIVE CV LAB;  Service: Cardiovascular;  Laterality: N/A;   WISDOM TOOTH EXTRACTION     XI ROBOTIC LAPAROSCOPIC ASSISTED APPENDECTOMY N/A  02/07/2023   Procedure: XI ROBOTIC LAPAROSCOPIC ASSISTED APPENDECTOMY;  Surgeon: Desiderio Schanz, MD;  Location: [facility] ORS;  Service: General;  Laterality: N/A;    Family History  Problem Relation Age of Onset   Hypertension Mother    Diabetes Mother    Hypertension Father    Prostate cancer Father    Thyroid  disease Father    Skin cancer Sister    Diabetes Brother    Hypertension Brother    Kidney disease Neg Hx    Bladder  Cancer Neg Hx     Social History   Tobacco Use   Smoking status: Never    Passive exposure: Never   Smokeless tobacco: Never  Substance Use Topics   Alcohol use: No    Comment: occasional     Current Outpatient Medications:    aspirin  EC 81 MG tablet, Take 1 tablet (81 mg total) by mouth daily. Swallow whole., Disp: 90 tablet, Rfl: 3   benazepril  (LOTENSIN ) 20 MG tablet, Take 1 tablet by mouth once daily, Disp: 30 tablet, Rfl: 0   cholecalciferol (VITAMIN D3) 25 MCG (1000 UNIT) tablet, Take 1,000 Units by mouth daily., Disp: , Rfl:    Cyanocobalamin (VITAMIN B-12) 3000 MCG SUBL, Place 3,000 mcg under the tongue every other day., Disp: , Rfl:    ibuprofen  (ADVIL ) 600 MG tablet, Take 1 tablet (600 mg total) by mouth every 6 (six) hours as needed., Disp: 30 tablet, Rfl: 0   Multiple Vitamins-Minerals (MULTIVITAMIN WITH MINERALS) tablet, Take 1 tablet by mouth daily., Disp: , Rfl:    Papav-Phentolamine-Alprostadil 150-5-50 MG-MG-MCG SOLR, 1 each by Intracavernosal route daily as needed (ED). Trimix, Disp: , Rfl:    rosuvastatin  (CRESTOR ) 20 MG tablet, Take 1 tablet by mouth once daily, Disp: 30 tablet, Rfl: 0   SYRINGE-NEEDLE, DISP, 3 ML (MONOJECT SAFETY SYRINGE/SHIELD) 23G X 1 3 ML MISC, Inject 1mL intramuscular every 10days, Disp: , Rfl:    tadalafil (CIALIS) 20 MG tablet, Take 20 mg by mouth daily as needed for erectile dysfunction., Disp: , Rfl:    testosterone  cypionate (DEPOTESTOSTERONE CYPIONATE) 200 MG/ML injection, Inject 0.3 mLs into the muscle once a week., Disp: , Rfl:    valACYclovir (VALTREX) 1000 MG tablet, Take 4,000 mg by mouth once., Disp: , Rfl:   Allergies  Allergen Reactions   Valsartan  Other (See Comments)    diarrhea    I personally reviewed active problem list, medication list, allergies, family history with the patient/caregiver today.   ROS  Ten systems reviewed and is negative except as mentioned in HPI    Objective Physical Exam  CONSTITUTIONAL:  Patient appears well-developed and well-nourished.  No distress. HEENT: Head atraumatic, normocephalic, neck supple. CARDIOVASCULAR: Normal rate, regular rhythm and normal heart sounds.  No murmur heard. No BLE edema. PULMONARY: Effort normal and breath sounds normal. No respiratory distress. ABDOMINAL: There is no tenderness or distention. MUSCULOSKELETAL: pain with change in position  PSYCHIATRIC: Patient has a normal mood and affect. behavior is normal. Judgment and thought content normal.  Vitals:   10/09/23 0840  BP: 136/84  Pulse: 90  Resp: 16  SpO2: 99%  Weight: 242 lb 14.4 oz (110.2 kg)  Height: 5' 11.56 (1.818 m)    Body mass index is 33.35 kg/m.    PHQ2/9:    10/09/2023    8:37 AM 06/20/2023    9:16 AM 12/12/2022    3:19 PM 10/12/2022   10:58 AM 08/15/2022    9:14 AM  Depression screen PHQ 2/9  Decreased Interest 0 0 0 0   Down, Depressed, Hopeless 0 0 0 0 0  PHQ - 2 Score 0 0 0 0 0  Altered sleeping   0 0 0  Tired, decreased energy   0 0 0  Change in appetite   0 0 0  Feeling bad or failure about yourself    0 0 0  Trouble concentrating   0 0 0  Moving slowly or fidgety/restless   0 0 0  Suicidal thoughts   0 0 0  PHQ-9 Score   0 0 0  Difficult doing work/chores   Not difficult at all Not difficult at all     phq 9 is negative  Fall Risk:    10/09/2023    8:37 AM 06/20/2023    9:15 AM 12/12/2022    3:11 PM 10/12/2022   10:58 AM 08/15/2022    9:14 AM  Fall Risk   Falls in the past year? 0 0 0 1 0  Number falls in past yr: 0 0 0 0   Injury with Fall? 0 0 0 1   Risk for fall due to : No Fall Risks No Fall Risks  Impaired balance/gait No Fall Risks  Follow up Falls evaluation completed Falls prevention discussed;Education provided;Falls evaluation completed  Falls prevention discussed;Education provided;Falls evaluation completed Falls prevention discussed      Assessment & Plan Chronic low back pain with left-sided radiculopathy, likely due to lumbar  spondylosis with left sacroiliac arthritis and multilevel narrowing Persistent lower back pain with radiating sharp pain to the left lateral thigh. Previous x-rays indicate arthritis and multilevel narrowing, suggesting nerve impingement due to inflammation. - Prescribed Celebrex  for inflammation and pain management. Take one tablet every morning and night for two weeks, then as needed. Avoid long-term use due to potential kidney and blood pressure side effects. - Discontinue ibuprofen  to avoid concurrent use with Celebrex . - Advise Tylenol  500 mg three times daily for pain management post-inflammation control. - Prescribe a non-sedating muscle relaxer to be taken mostly at night, up to three times a day as needed for muscle spasms. - Monitor for symptoms of nerve impingement and advise immediate medical attention if bowel or bladder incontinence or perineal numbness occurs.  Atherosclerotic cardiovascular disease/CAD No current angina or chest pain. Managed with aspirin  and rosuvastatin . - Continue aspirin  81 mg daily. - Continue rosuvastatin  20 mg daily.  Hypertension Well-controlled with benazepril . Slightly elevated blood pressure in the morning, likely due to timing of medication administration. - Continue benazepril  20 mg daily. - Monitor blood pressure at home, especially in the morning.  Hyperlipidemia/Atherosclerosis of Aorta Managed with rosuvastatin . Recent labs show LDL cholesterol at 69 mg/dL, within target range. - Continue rosuvastatin  20 mg daily.  Testosterone  deficiency on replacement therapy Managed with testosterone  injections for erectile dysfunction, which have been effective. Recent testosterone  level was low at 287 ng/dL. - Continue testosterone  replacement therapy as prescribed. - Discontinue Cialis.  Hyperuricemia Uric acid levels previously elevated, recent labs show improvement with a decrease to 6.0 mg/dL. - Monitor uric acid levels in future lab work.

## 2023-11-03 IMAGING — CR DG HAND COMPLETE 3+V*R*
1 series · 3 of 3 positions shown · non-contrast
Comparison: None.

CLINICAL DATA: Likely osteoarthritis. Joint aches. Right index
finger pain for 2 months.

EXAM:
RIGHT HAND - COMPLETE 3+ VIEW

[Series 1: dg hand complete right · 0.14mm/px · 3 of 3 slices shown]
[im 1/3]
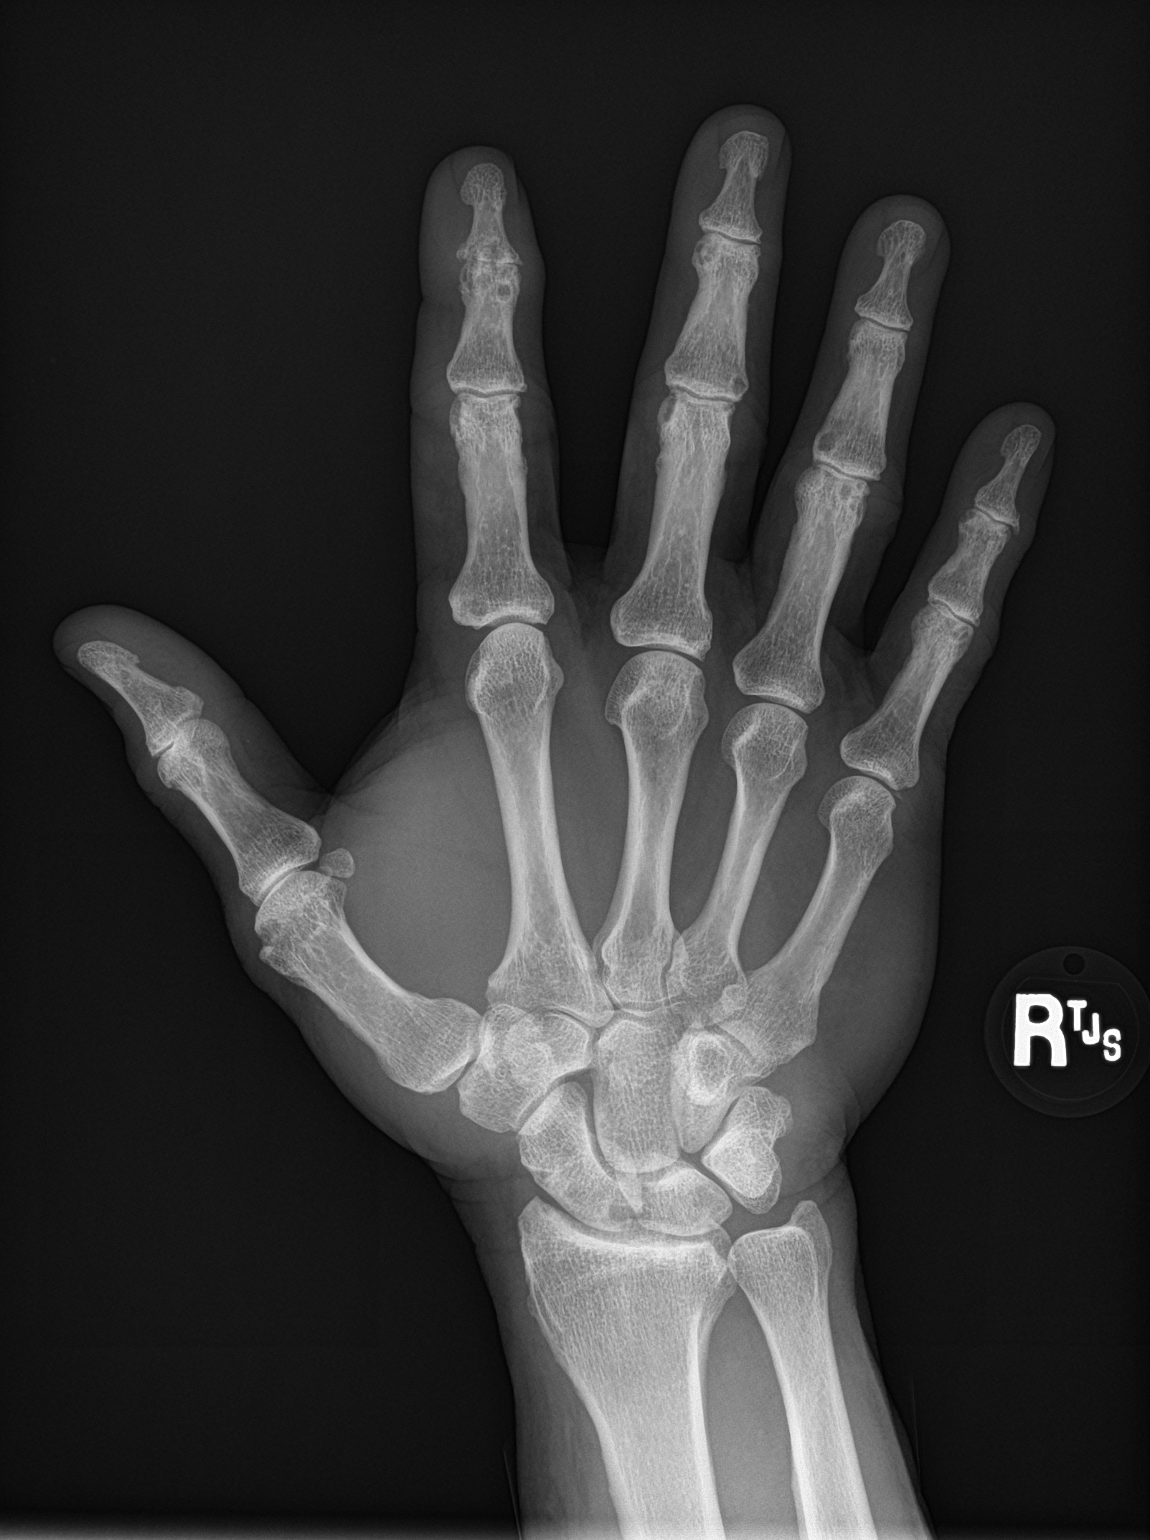
[im 2/3]
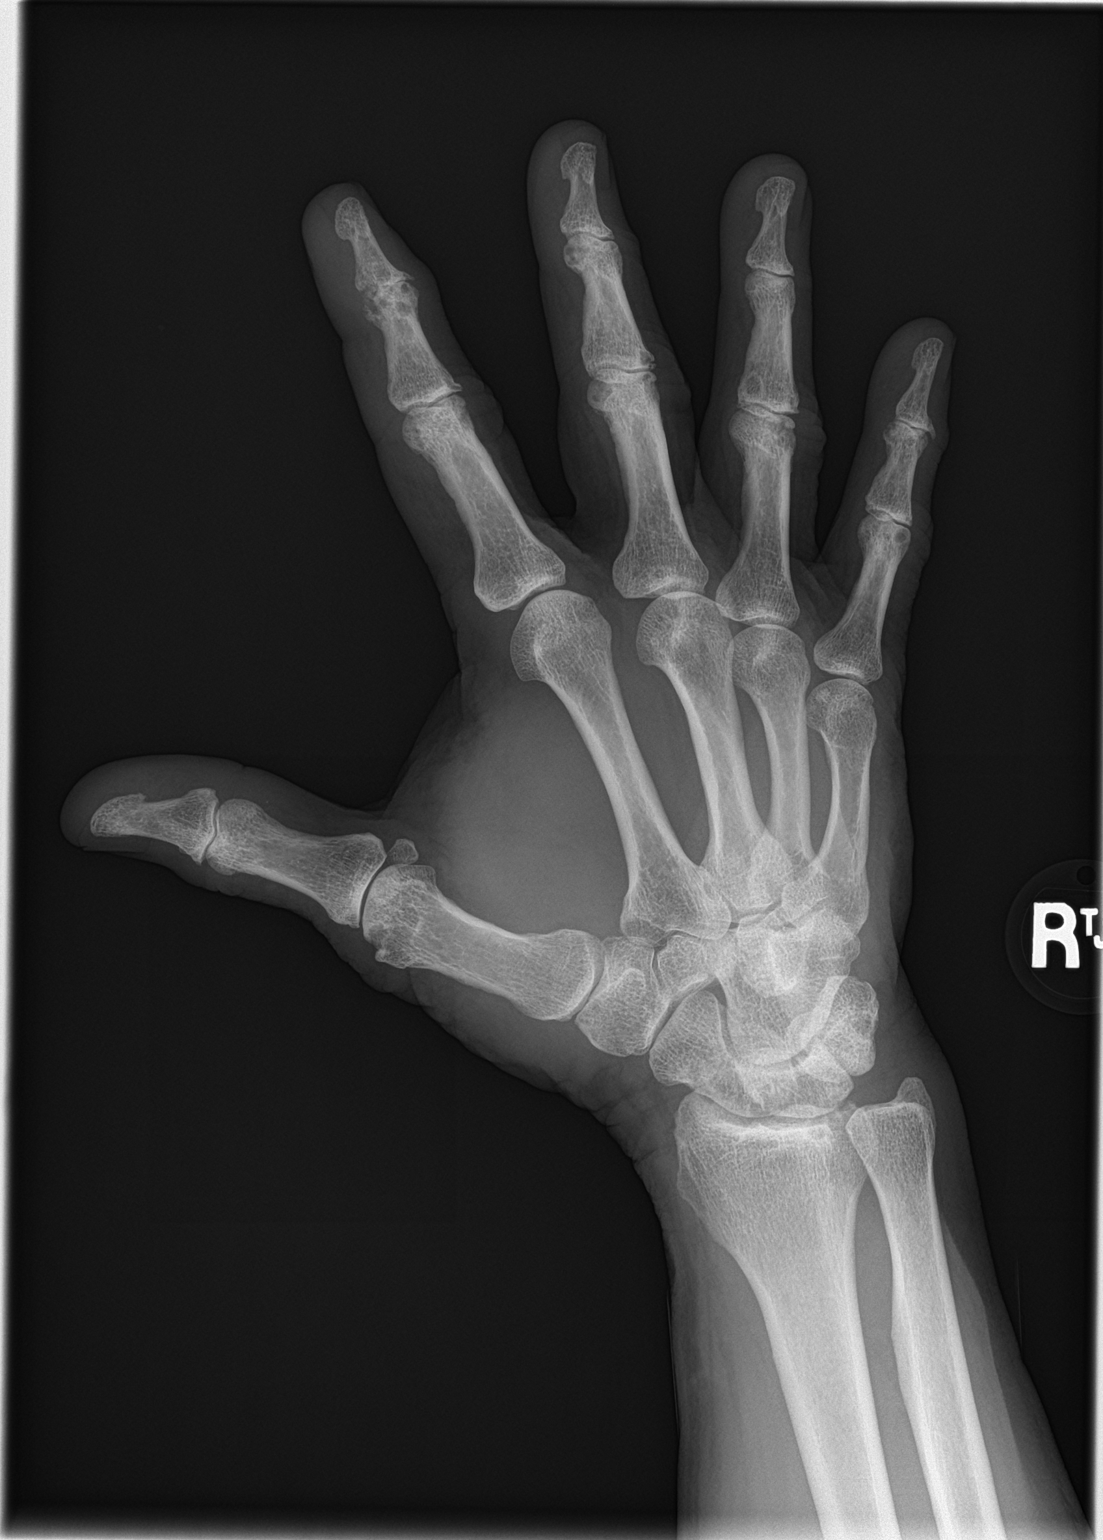
[im 3/3]
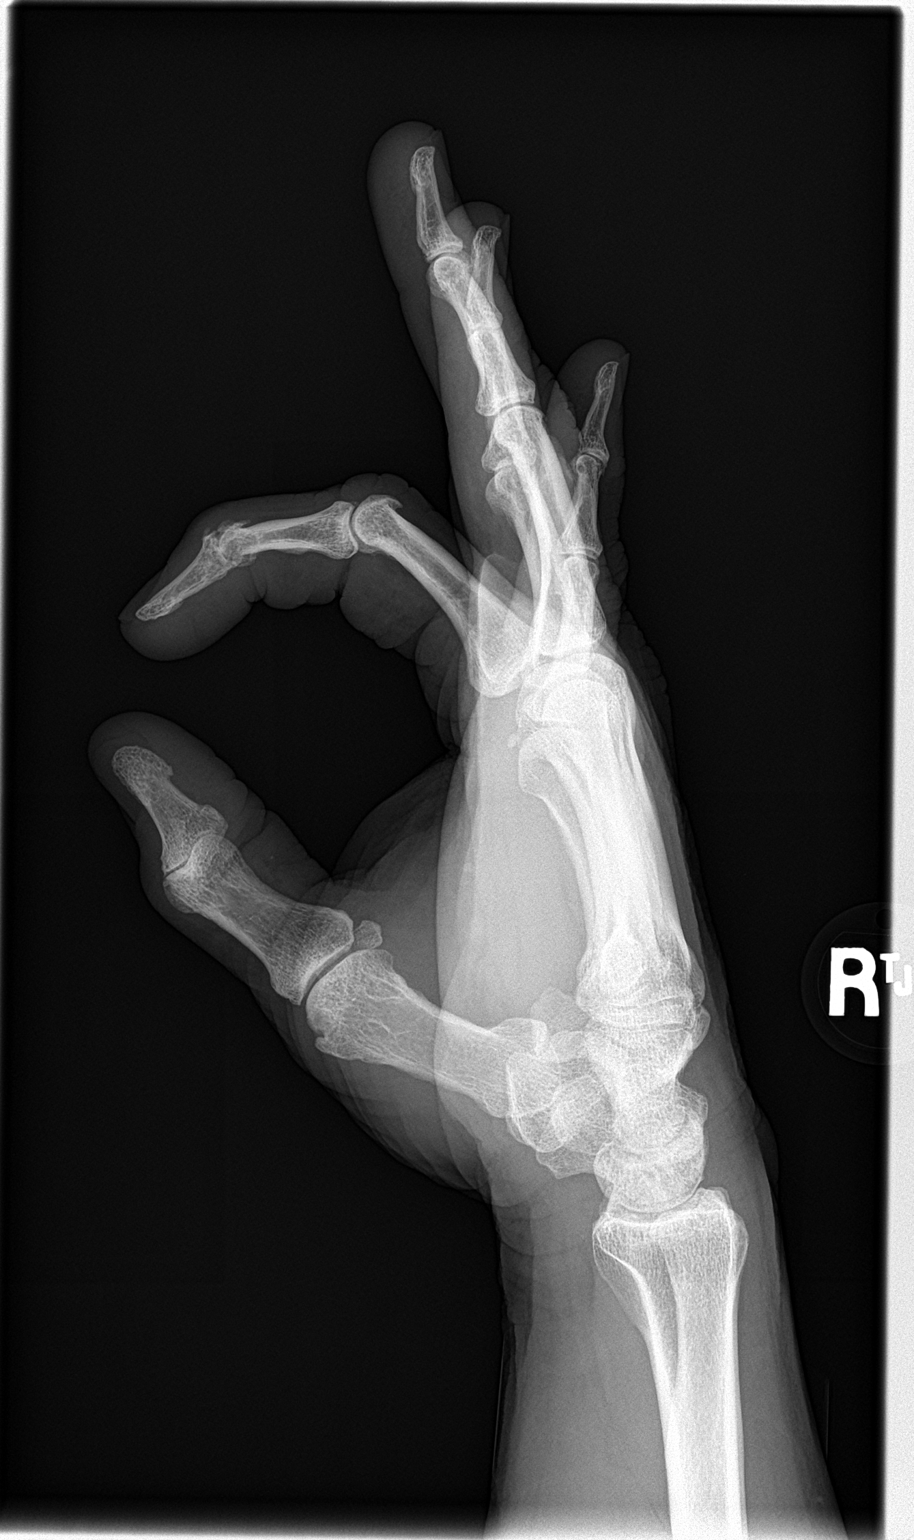

[3 of 3 positions shown; findings below may reference images not displayed]

FINDINGS: Moderate scapholunate joint space narrowing and subchondral cystic
degenerative change.

Moderate triscaphe joint and thumb carpometacarpal joint space
narrowing. Moderate to severe thumb metacarpophalangeal joint and
interphalangeal joint space narrowing and subchondral sclerosis.
Severe index finger DIP joint space narrowing, subchondral
sclerosis, and subchondral degenerative cystic changes with
mild-to-moderate peripheral degenerative osteophytosis. Moderate
second through fifth digit DIP joint space narrowing with mild
peripheral degenerative osteophytes greatest at the fifth finger.
Moderate second, third, and fourth PIP joint space narrowing and
subchondral sclerosis/cystic degenerative change.

No acute fracture or dislocation.
IMPRESSION: Arthritis in a distribution consistent with osteoarthritis greatest
within the index finger DIP joint, thumb interphalangeal joint, and
thumb metacarpophalangeal joint.

## 2023-11-19 ENCOUNTER — Ambulatory Visit: Payer: Self-pay

## 2023-11-19 NOTE — Telephone Encounter (Signed)
 FYI Only or Action Required?: FYI only for provider.  Patient was last seen in primary care on 10/09/2023 by Glenard Mire, MD.  Called Nurse Triage reporting Hip Pain.  Symptoms began several weeks ago.  Interventions attempted: Rest, hydration, or home remedies.  Symptoms are: unchanged.  Triage Disposition: See PCP When Office is Open (Within 3 Days)  Patient/caregiver understands and will follow disposition?: Yes Reason for Disposition  [1] MODERATE pain (e.g., interferes with normal activities, limping) AND [2] present > 3 days  Answer Assessment - Initial Assessment Questions Had a big bruise across hip and down leg after the occurrence when playing volleyball.   1. LOCATION and RADIATION: Where is the pain located? Does the pain spread (shoot) anywhere else?     Left hip  2. QUALITY: What does the pain feel like?  (e.g., sharp, dull, aching, burning)     Feels tingly and numb, hurts to lay on that side on something firm  3. SEVERITY: How bad is the pain? What does it keep you from doing?   (Scale 1-10; or mild, moderate, severe)     Cannot put weight / strong touch to that area. Walking with a limp  4. ONSET: When did the pain start? Does it come and go, or is it there all the time?     About 4 weeks ago  5. WORK OR EXERCISE: Has there been any recent work or exercise that involved this part of the body?      Yes, playing volleyball  6. CAUSE: What do you think is causing the hip pain?      Made an awkward twist playing volleyball   7. AGGRAVATING FACTORS: What makes the hip pain worse? (e.g., walking, climbing stairs, running)     Putting pressure on it, and notices a limp but it doesn't hurt walk   8. OTHER SYMPTOMS: Do you have any other symptoms? (e.g., back pain, pain shooting down leg,  fever, rash)     Tingling sensation going down leg  Protocols used: Hip Pain-A-AH  Copied from CRM #[id number]. Topic: Appointments - Appointment  Scheduling >> Nov 19, 2023  4:37 PM Victoria B wrote: Patient says left hip is sore and he limps and feeling numbness towards his knee on the outside of it

## 2023-11-20 ENCOUNTER — Other Ambulatory Visit: Payer: Self-pay

## 2023-11-20 ENCOUNTER — Encounter: Payer: Self-pay | Admitting: Internal Medicine

## 2023-11-20 ENCOUNTER — Ambulatory Visit
Admission: RE | Admit: 2023-11-20 | Discharge: 2023-11-20 | Disposition: A | Discharge status: Home / Self Care | Attending: Internal Medicine | Admitting: Internal Medicine

## 2023-11-20 ENCOUNTER — Ambulatory Visit: Admitting: Internal Medicine

## 2023-11-20 ENCOUNTER — Ambulatory Visit
Admission: RE | Admit: 2023-11-20 | Discharge: 2023-11-20 | Disposition: A | Source: Ambulatory Visit | Attending: Internal Medicine

## 2023-11-20 VITALS — BP 130/76 | HR 86 | Temp 97.9°F | Resp 16 | Ht 73.0 in | Wt 246.5 lb

## 2023-11-20 DIAGNOSIS — M25552 Pain in left hip: Secondary | ICD-10-CM

## 2023-11-20 NOTE — Progress Notes (Signed)
 Acute Office Visit  Subjective:     Patient ID: Seth Meadows, male    DOB: 10-Sep-1960, 63 y.o.   MRN: [id number]  Chief Complaint  Patient presents with   Hip Injury    Left hip injury playing volleyball   Knee Injury    Left knee tingling     HPI Patient is in today for hip and knee pain.   Discussed the use of AI scribe software for clinical note transcription with the patient, who gave verbal consent to proceed.  History of Present Illness Seth Meadows is a 63 year old male who presents with hip pain and bruising following a volleyball injury.  He experienced sharp pain in his hip after a sudden movement during volleyball, feeling as if the ball popped out of the socket, followed by numbness in his leg for about thirty minutes. Extensive bruising developed from his hip to his backside, which has resolved, but pain persists when touching the area and when lying on a hard surface.  Pain is localized to the hip joint, particularly when lying on it, but is absent while standing or walking. He has a limp and an altered gait due to perceived weakness or soreness, not pain. He can bear weight on the leg and continue some activities, such as playing volleyball, but avoids heavy lifting with his legs.  The injury occurred approximately three to four weeks ago. There is no knee pain, but tingling and mild pain are present along the IT band. He has no fever or additional bruising.   Review of Systems  Musculoskeletal:  Positive for joint pain.        Objective:    BP 130/76 (Cuff Size: Large)   Pulse 86   Temp 97.9 F (36.6 C) (Oral)   Resp 16   Ht 6' 1 (1.854 m)   Wt 246 lb 8 oz (111.8 kg)   SpO2 98%   BMI 32.52 kg/m    Physical Exam Constitutional:      Appearance: Normal appearance.  HENT:     Head: Normocephalic and atraumatic.  Eyes:     Conjunctiva/sclera: Conjunctivae normal.  Cardiovascular:     Rate and Rhythm: Normal rate and regular rhythm.  Pulmonary:      Effort: Pulmonary effort is normal.     Breath sounds: Normal breath sounds.  Musculoskeletal:     Right hip: Normal.     Left hip: Tenderness and bony tenderness present. No crepitus. Decreased range of motion. Normal strength.     Comments: Tenderness at IT band insertion point and over greater trochanter with mildly restricted internal rotation  Skin:    General: Skin is warm and dry.  Neurological:     General: No focal deficit present.     Mental Status: He is alert. Mental status is at baseline.  Psychiatric:        Mood and Affect: Mood normal.        Behavior: Behavior normal.     No results found for any visits on 11/20/23.      Assessment & Plan:   Assessment & Plan Left hip pain and possible soft tissue injury Left hip pain persists with tingling and noticeable limp. Differential includes subluxation, labrum tear, or tendon injury. Restricted internal rotation noted. - Order x-ray of left hip to rule out fractures or arthritis. - Consider MRI if x-ray inconclusive and symptoms persist. - Discuss potential for physical therapy to strengthen hip muscles. - Advise avoiding heavy  lifting and high-impact activities until further evaluation.  - DG HIP UNILAT W OR W/O PELVIS 2-3 VIEWS LEFT; Future   Return if symptoms worsen or fail to improve.  Sharyle Fischer, DO

## 2023-11-26 ENCOUNTER — Ambulatory Visit: Payer: Self-pay | Admitting: Internal Medicine

## 2023-11-26 ENCOUNTER — Other Ambulatory Visit: Payer: Self-pay | Admitting: Family Medicine

## 2023-11-26 ENCOUNTER — Ambulatory Visit: Payer: Self-pay

## 2023-11-26 DIAGNOSIS — M25552 Pain in left hip: Secondary | ICD-10-CM

## 2023-11-26 NOTE — Telephone Encounter (Signed)
 FYI Only or Action Required?: Action required by provider: clinical question for provider.  Patient was last seen in primary care on 11/20/2023 by Bernardo Fend, DO.  Called Nurse Triage reporting Hip Pain.  Symptoms began several weeks ago.  Interventions attempted: Rest, hydration, or home remedies.  Symptoms are: unchanged. Continues to have hip pain. States he was told he would need an MRI. Please advise pt.  Triage Disposition: See PCP When Office is Open (Within 3 Days)  Patient/caregiver understands and will follow disposition?: No, wishes to speak with PCP    Copied from CRM #[id number]. Topic: Clinical - Red Word Triage >> Nov 26, 2023 11:48 AM Pinkey ORN wrote: Red Word that prompted transfer to Nurse Triage: Worsening Pain In Hip Reason for Disposition  [1] MODERATE pain (e.g., interferes with normal activities, limping) AND [2] present > 3 days  Answer Assessment - Initial Assessment Questions 1. LOCATION and RADIATION: Where is the pain located? Does the pain spread (shoot) anywhere else?     Left hip and down to knee 2. QUALITY: What does the pain feel like?  (e.g., sharp, dull, aching, burning)     Ache 3. SEVERITY: How bad is the pain? What does it keep you from doing?   (Scale 1-10; or mild, moderate, severe)     8 if he lays on it 4. ONSET: When did the pain start? Does it come and go, or is it there all the time?     4 weeks 5. WORK OR EXERCISE: Has there been any recent work or exercise that involved this part of the body?      Twisted 6. CAUSE: What do you think is causing the hip pain?      above 7. AGGRAVATING FACTORS: What makes the hip pain worse? (e.g., walking, climbing stairs, running)     walking 8. OTHER SYMPTOMS: Do you have any other symptoms? (e.g., back pain, pain shooting down leg,  fever, rash)     no  Protocols used: Hip Pain-A-AH

## 2023-12-05 ENCOUNTER — Ambulatory Visit: Payer: Self-pay | Admitting: Family Medicine

## 2023-12-05 ENCOUNTER — Telehealth: Payer: Self-pay

## 2023-12-05 ENCOUNTER — Ambulatory Visit
Admission: RE | Admit: 2023-12-05 | Discharge: 2023-12-05 | Disposition: A | Discharge status: Home / Self Care | Source: Ambulatory Visit | Attending: Family Medicine | Admitting: Family Medicine

## 2023-12-05 DIAGNOSIS — M25552 Pain in left hip: Secondary | ICD-10-CM | POA: Diagnosis present

## 2023-12-05 NOTE — Telephone Encounter (Signed)
 Copied from CRM [id number]. Topic: Clinical - Request for Lab/Test Order >> Dec 05, 2023  4:28 PM Myrick T wrote: Reason for CRM: patient request an order for the lumbar spine MRI. Please f/u with patient

## 2023-12-06 ENCOUNTER — Other Ambulatory Visit: Payer: Self-pay | Admitting: Family Medicine

## 2023-12-06 DIAGNOSIS — M5416 Radiculopathy, lumbar region: Secondary | ICD-10-CM

## 2023-12-10 ENCOUNTER — Ambulatory Visit
Admission: RE | Admit: 2023-12-10 | Discharge: 2023-12-10 | Disposition: A | Discharge status: Home / Self Care | Source: Ambulatory Visit | Attending: Family Medicine | Admitting: Family Medicine

## 2023-12-10 DIAGNOSIS — M5416 Radiculopathy, lumbar region: Secondary | ICD-10-CM | POA: Diagnosis present

## 2023-12-14 ENCOUNTER — Other Ambulatory Visit: Payer: Self-pay | Admitting: Family Medicine

## 2023-12-14 ENCOUNTER — Ambulatory Visit: Payer: Self-pay | Admitting: Family Medicine

## 2023-12-14 DIAGNOSIS — N281 Cyst of kidney, acquired: Secondary | ICD-10-CM

## 2023-12-18 ENCOUNTER — Other Ambulatory Visit: Payer: Self-pay | Admitting: Family Medicine

## 2023-12-18 DIAGNOSIS — G8929 Other chronic pain: Secondary | ICD-10-CM

## 2023-12-18 NOTE — Telephone Encounter (Signed)
 Copied from CRM #[id number]. Topic: General - Other >> Dec 18, 2023  3:25 PM Seth Meadows wrote: Reason for CRM: patient is responding to his results on my chart, he would like to see a neurosurgeon.

## 2023-12-24 ENCOUNTER — Ambulatory Visit
Admission: RE | Admit: 2023-12-24 | Discharge: 2023-12-24 | Disposition: A | Source: Ambulatory Visit | Attending: Family Medicine | Admitting: Family Medicine

## 2023-12-24 DIAGNOSIS — N281 Cyst of kidney, acquired: Secondary | ICD-10-CM | POA: Diagnosis present

## 2023-12-28 ENCOUNTER — Other Ambulatory Visit: Payer: Self-pay

## 2023-12-28 ENCOUNTER — Inpatient Hospital Stay
Admission: RE | Admit: 2023-12-28 | Discharge: 2023-12-28 | Disposition: A | Payer: Self-pay | Source: Ambulatory Visit | Attending: Physician Assistant | Admitting: Physician Assistant

## 2023-12-28 DIAGNOSIS — Z049 Encounter for examination and observation for unspecified reason: Secondary | ICD-10-CM

## 2023-12-28 NOTE — Progress Notes (Signed)
 Referring Physician:  Sowles, Krichna, MD [address] [place],  [place] [address]  Primary Physician:  Meadows, Krichna, MD  History of Present Illness: 01/01/2024 Mr. Seth Meadows is here today with a chief complaint of chronic low back pain with sharp pain alternating on both sides of his lower back.  Unfortunately a couple of months ago patient was playing volleyball and had an instance in which she stepped and had a pop.  Since then he has had tingling to his anterior lateral thigh and also had extensive bruising in the area.  The tingling has continued, but the bruising has resolved.  He does not feel as though his back pain radiates down either leg.  He has noticed worsening pain in his back when he is up and moving which is been unrelieved by Celebrex  and Skelaxin  sparingly.  He denies any leg heaviness, cramping.  No saddle anesthesia or incontinence of bowel or bladder.    Weakness: none Bowel/Bladder Dysfunction: none  Conservative measures:  Physical therapy: Has participated for upper back @ [facility]. Multimodal medical therapy including regular antiinflammatories:  Celebrex , Tylenol , metaxalone    Injections: No epidural steroid injections.   Seth Meadows has no symptoms of cervical myelopathy.  The symptoms are causing a significant impact on the patient's life.   Review of Systems:  A 10 point review of systems is negative, except for the pertinent positives and negatives detailed in the HPI.  Past Medical History: Past Medical History:  Diagnosis Date   Allergic rhinitis    BPH with obstruction/lower urinary tract symptoms    Cutaneous eruption    Earache    Epididymitis, left    Hyperglycemia    Hypertension    Hypogonadism in male    Kidney function test abnormal    Overweight    Scrotal pain    Sleep apnea     Past Surgical History: Past Surgical History:  Procedure Laterality Date   COLONOSCOPY WITH PROPOFOL  N/A 07/04/2022    Procedure: COLONOSCOPY WITH PROPOFOL ;  Surgeon: Seth Bi, MD;  Location: [facility] ENDOSCOPY;  Service: Gastroenterology;  Laterality: N/A;   EYELID LACERATION REPAIR  01/2017   cut the the eye lid to drain the infection out   LEFT HEART CATH AND CORONARY ANGIOGRAPHY N/A 07/25/2022   Procedure: LEFT HEART CATH AND CORONARY ANGIOGRAPHY;  Surgeon: Seth Alm ORN, MD;  Location: [facility] INVASIVE CV LAB;  Service: Cardiovascular;  Laterality: N/A;   WISDOM TOOTH EXTRACTION     XI ROBOTIC LAPAROSCOPIC ASSISTED APPENDECTOMY N/A 02/07/2023   Procedure: XI ROBOTIC LAPAROSCOPIC ASSISTED APPENDECTOMY;  Surgeon: Seth Schanz, MD;  Location: [facility] ORS;  Service: General;  Laterality: N/A;    Allergies: Allergies as of 01/01/2024 - Review Complete 11/20/2023  Allergen Reaction Noted   Valsartan  Other (See Comments) 02/04/2020    Medications: Outpatient Encounter Medications as of 01/01/2024  Medication Sig   tadalafil (CIALIS) 20 MG tablet Take 20 mg by mouth daily as needed.   valACYclovir (VALTREX) 1000 MG tablet Take by mouth.   aspirin  EC 81 MG tablet Take 1 tablet (81 mg total) by mouth daily. Swallow whole.   benazepril  (LOTENSIN ) 20 MG tablet Take 1 tablet (20 mg total) by mouth daily.   celecoxib  (CELEBREX ) 100 MG capsule Take 1 capsule (100 mg total) by mouth 2 (two) times daily. Two weeks and after that prn   cholecalciferol (VITAMIN D3) 25 MCG (1000 UNIT) tablet Take 1,000 Units by mouth daily.   Cyanocobalamin (VITAMIN B-12)  3000 MCG SUBL Place 3,000 mcg under the tongue every other day.   metaxalone  (SKELAXIN ) 800 MG tablet Take 1 tablet (800 mg total) by mouth 3 (three) times daily as needed for muscle spasms.   Multiple Vitamins-Minerals (MULTIVITAMIN WITH MINERALS) tablet Take 1 tablet by mouth daily.   Papav-Phentolamine-Alprostadil 150-5-50 MG-MG-MCG SOLR 1 each by Intracavernosal route daily as needed (ED). Trimix   rosuvastatin  (CRESTOR ) 20 MG tablet Take 1 tablet (20 mg total) by mouth  daily.   SYRINGE-NEEDLE, DISP, 3 ML (MONOJECT SAFETY SYRINGE/SHIELD) 23G X 1 3 ML MISC Inject 1mL intramuscular every 10days   testosterone  cypionate (DEPOTESTOSTERONE CYPIONATE) 200 MG/ML injection Inject 0.3 mLs into the muscle once a week.   No facility-administered encounter medications on file as of 01/01/2024.    Social History: Social History   Tobacco Use   Smoking status: Never    Passive exposure: Never   Smokeless tobacco: Never  Vaping Use   Vaping status: Never Used  Substance Use Topics   Alcohol use: No    Comment: occasional   Drug use: No    Family Medical History: Family History  Problem Relation Age of Onset   Hypertension Mother    Diabetes Mother    Hypertension Father    Prostate cancer Father    Thyroid  disease Father    Skin cancer Sister    Diabetes Brother    Hypertension Brother    Kidney disease Neg Hx    Bladder Cancer Neg Hx     Physical Examination: @VITALWITHPAIN @  General: Patient is well developed, well nourished, calm, collected, and in no apparent distress. Attention to examination is appropriate.  Psychiatric: Patient is non-anxious.  Head:  Pupils equal, round, and reactive to light.  ENT:  Oral mucosa appears well hydrated.  Neck:   Supple.  Full range of motion.  Respiratory: Patient is breathing without any difficulty.  Extremities: No edema.  Vascular: Palpable dorsal pedal pulses.  Skin:   On exposed skin, there are no abnormal skin lesions.  NEUROLOGICAL:     Awake, alert, oriented to person, place, and time.  Speech is clear and fluent. Fund of knowledge is appropriate.   Cranial Nerves: Pupils equal round and reactive to light.  Facial tone is symmetric.   ROM of spine: Minimal tenderness palpation of lumbar paraspinals. Difficult to obtain Tinel of left lateral femoral cutaneous nerve. -SLR    Strength:  Side Iliopsoas Quads Hamstring PF DF EHL  R 5 5 5 5 5 5   L 5 5 5 5 5 5     2+ R patella, 1+  left patella, absent achilles reflexes bilaterally  Clonus is not present.  Toes are down-going.  Bilateral upper and lower extremity sensation is intact to light touch, with the exception of his left lateral thigh. Gait is normal.   No difficulty with tandem gait.   No evidence of dysmetria noted.  Medical Decision Making  Imaging: EXAM: MRI LUMBAR SPINE 12/10/2023 04:58:39 PM   TECHNIQUE: Multiplanar multisequence MRI of the lumbar spine was performed without the administration of intravenous contrast.   COMPARISON: None available.   CLINICAL HISTORY: Lumbar radiculopathy, chronic pain goes into left hip, least mild degenerative disc disease at L5-S1.   FINDINGS:   BONES AND ALIGNMENT: Straightening of the normal lumbar lordosis. Normal vertebral body heights. Bone marrow signal is unremarkable. There is congenital narrowing of the lumbar spinal canal secondary to short pedicles particularly at L4 and L5. Hemangioma in the posterior aspect  of the L3 vertebral body. There are degenerative endplate changes at multiple levels throughout the visualized spine. Subtle chronic endplate irregularity at T11 and T12 related to degenerative changes. There are Schmorl nodes at multiple levels. Fatty marrow changes at the L1-L2 level related to degenerative endplate changes. There is discogenic edema posteriorly at L4-L5 and additional more prominent discogenic edema at L5-S1 related to degenerative changes. Focus of cystic change in the inferior aspect of the L1 vertebral body.   SPINAL CORD: The conus terminates normally.   SOFT TISSUES: The paraspinal soft tissues are unremarkable. Partially visualized cyst in the posterior aspect of the right kidney which measures at least 6 cm in diameter.   T12-L1: There is a small disc bulge. Bilateral facet arthrosis. No evidence of significant spinal canal stenosis on sagittal images. Mild foraminal stenosis on the right.    L1-L2: There is mild disc height loss. Diffuse disc bulge and posterior osteophytes resulting in lateral recess narrowing slightly greater on the left. Moderate to severe facet arthrosis. There is mild spinal canal stenosis. Mild bilateral foraminal stenosis.   L2-L3: There is mild disc height loss. Diffuse disc bulge and posterior osteophytes resulting in lateral recess narrowing. There is potential for impingement of the traversing L3 nerve roots. Moderate to severe facet arthrosis and thickening of the ligamentum flavum. Moderate spinal canal stenosis with crowding of the cauda equina and nerve roots at this level. There is mild bilateral foraminal stenosis slightly greater on the right.   L3-L4: There is moderate disc height loss. Diffuse disc bulge and posterior osteophytes resulting in lateral recess narrowing. Moderate to severe facet arthrosis and thickening of the ligamentum flavum. There is potential for impingement of the traversing L4 nerve roots particularly on the right. Moderate spinal canal stenosis. There is mild bilateral foraminal stenosis.   L4-L5: There is mild disc height loss. Diffuse disc bulge resulting in lateral recess narrowing slightly greater on the right. Moderate facet arthrosis and thickening of the ligamentum flavum. Mild spinal canal stenosis. There is mild bilateral foraminal stenosis on the left.   L5-S1: There is mild disc height loss. Diffuse disc bulge. Moderate facet arthrosis and thickening of the ligamentum flavum. Mild lateral recess narrowing. No significant spinal canal stenosis. There is mild bilateral foraminal stenosis.   IMPRESSION: 1. Congenital narrowing of the lumbar spinal canal secondary to short pedicles. Superimposed degenerative changes as described. 2. Moderate spinal canal stenosis at L2-3 and L3-4. 3. Lateral recess narrowing at multiple levels. L2-3 lateral recess narrowing and potential impingement of traversing L3  nerve roots. L3-4 lateral recess narrowing and potential impingement of traversing L4 nerve roots, greater on the right. 4. Mild foraminal stenosis at multiple levels. 5. Partially visualized right renal cyst measuring at least 6 cm, recommend ultrasound or renal protocol imaging correlation if not previously characterized.  EXAM DESCRIPTION: MR HIP LEFT WO CONTRAST   CLINICAL HISTORY: severe left hip pain, x-ray mild arthritis   COMPARISON: None Available.   TECHNIQUE: MRI of the hip is performed according to our usual protocol with multiplanar multi sequence imaging.   FINDINGS: No fracture. Partial imaging of at least mild degenerative disc disease L5-S1 with discogenic endplate marrow edema. The marrow signal is otherwise unremarkable. No significant joint effusion. Mild greater trochanteric bursal fluid on the left.   No labral tear. No significant degenerative change left hip. The tendons and musculature are unremarkable. No subcutaneous edema.   IMPRESSION: There is at least mild degenerative disc disease at L5-S1. This  is partially imaged. Consider MRI of the lumbar spine.   Mild greater trochanteric bursal fluid on the left.   The left hip is otherwise unremarkable.    I have personally reviewed the images and agree with the above interpretation.  Assessment and Plan: Mr. Roper is a pleasant 63 y.o. male with chronic, but worsening low back pain that is non radiating as well as new tingling in his left lateral thigh after hurting himself while playing volleyball several months ago which was also associated with a large contusion thigh.  Patient has known lumbar stenosis, worse centrally extending from L2-L4 which is the likely cause of his chronic back pain in addition to his degenerative disc disease.  In regards to the tingling in his left thigh, it sounds more like a traumatic left lateral femoral cutaneous neuropathy.  Thankfully despite the tingling and is not  causing him significant pain.  Regards to his back plan includes the following moving forward:  -Physical therapy specific for his lumbar spine - X-rays today to include flexion-extension to evaluate for listhesis - Steroid Dosepak in the meantime.  Risks of this medication were discussed at length. - Discussed injections in patient's spine to potentially help with the pain.  I have placed a referral for him as well. - See back in approximately 8 weeks.  May follow-up sooner if needed.  Thank you for involving me in the care of this patient.   I spent a total of 45 minutes in both face-to-face and non-face-to-face activities for this visit on the date of this encounter including preparing to see the patient, obtaining and reviewing separately obtained history, performing medically appropriate examination, counseling the patient, ordering additional medications and tests, documenting clinical information, independently interpreting results, coordination of care.   Lyle Decamp, PA-C Dept. of Neurosurgery

## 2023-12-28 NOTE — Addendum Note (Signed)
 Addended by: BENN AUSTON HERO on: 12/28/2023 12:49 PM   Modules accepted: Orders

## 2024-01-01 ENCOUNTER — Ambulatory Visit: Admitting: Physician Assistant

## 2024-01-01 ENCOUNTER — Ambulatory Visit

## 2024-01-01 ENCOUNTER — Other Ambulatory Visit: Payer: Self-pay | Admitting: Family Medicine

## 2024-01-01 ENCOUNTER — Ambulatory Visit: Payer: Self-pay | Admitting: Family Medicine

## 2024-01-01 ENCOUNTER — Encounter: Payer: Self-pay | Admitting: Physician Assistant

## 2024-01-01 VITALS — BP 150/94 | Wt 246.4 lb

## 2024-01-01 DIAGNOSIS — M48061 Spinal stenosis, lumbar region without neurogenic claudication: Secondary | ICD-10-CM

## 2024-01-01 DIAGNOSIS — G8929 Other chronic pain: Secondary | ICD-10-CM

## 2024-01-01 DIAGNOSIS — N281 Cyst of kidney, acquired: Secondary | ICD-10-CM

## 2024-01-01 MED ORDER — METHYLPREDNISOLONE 4 MG PO TBPK: ORAL_TABLET | ORAL | 0 refills | Status: AC

## 2024-01-03 NOTE — Telephone Encounter (Signed)
 Requested Prescriptions  Pending Prescriptions Disp Refills   celecoxib  (CELEBREX ) 100 MG capsule [Pharmacy Med Name: Celecoxib  100 MG Oral Capsule] 180 capsule 1    Sig: TAKE ONE CAPSULE BY MOUTH TWICE DAILY FOR TWO WEEKS AND AFTER THAT TAKE AS NEEDED     Analgesics:  COX2 Inhibitors Failed - 01/03/2024 11:34 AM      Failed - Manual Review: Labs are only required if the patient has taken medication for more than 8 weeks.      Failed - Cr in normal range and within 360 days    Creat  Date Value Ref Range Status  06/21/2023 1.53 (H) 0.70 - 1.35 mg/dL Final         Failed - HCT in normal range and within 360 days    HCT  Date Value Ref Range Status  06/21/2023 50.8 (H) 38.5 - 50.0 % Final   Hematocrit  Date Value Ref Range Status  03/29/2021 56.3 (H) 37.5 - 51.0 % Final         Passed - HGB in normal range and within 360 days    Hemoglobin  Date Value Ref Range Status  06/21/2023 16.7 13.2 - 17.1 g/dL Final  89/81/7978 81.5 (H) 13.0 - 17.7 g/dL Final         Passed - AST in normal range and within 360 days    AST  Date Value Ref Range Status  06/21/2023 28 10 - 35 U/L Final         Passed - ALT in normal range and within 360 days    ALT  Date Value Ref Range Status  06/21/2023 28 9 - 46 U/L Final         Passed - eGFR is 30 or above and within 360 days    GFR, Est African American  Date Value Ref Range Status  02/04/2020 73 > OR = 60 mL/min/1.53m2 Final   GFR, Est Non African American  Date Value Ref Range Status  02/04/2020 63 > OR = 60 mL/min/1.73m2 Final   GFR, Estimated  Date Value Ref Range Status  02/07/2023 >60 >60 mL/min Final    Comment:    (NOTE) Calculated using the CKD-EPI Creatinine Equation (2021)    eGFR  Date Value Ref Range Status  06/21/2023 50 (L) > OR = 60 mL/min/1.48m2 Final         Passed - Patient is not pregnant      Passed - Valid encounter within last 12 months    Recent Outpatient Visits           1 month ago Acute pain  of left hip   [facility] Bernardo Fend, DO   2 months ago Chronic bilateral low back pain with left-sided sciatica   [facility] Glenard Mire, MD   6 months ago Well adult exam   [facility] Glenard Mire, MD   9 months ago Essential hypertension   [facility] Glenard Mire, MD       Future Appointments             In 5 months Glenard, Krichna, MD [facility], [place]

## 2024-01-04 ENCOUNTER — Telehealth: Payer: Self-pay

## 2024-01-04 NOTE — Telephone Encounter (Signed)
 Copied from CRM #[id number]. Topic: Referral - Status >> Jan 04, 2024  3:52 PM Santiya F wrote: Reason for CRM: Patient is calling in because he requested a referral to [facility] but it was sent to [facility]. He is requesting it be sent to [facility].

## 2024-01-04 NOTE — Telephone Encounter (Signed)
 03/04/2023  3:05 PM Seth Meadows Referral sent to -  Note: Referral sent to  Gladis Lauraine Kindler, NP  [address] [facility]  [place], [place] [address]  [phone] (Work)  [phone] (Fax)

## 2024-01-21 ENCOUNTER — Other Ambulatory Visit: Payer: Self-pay | Admitting: Family Medicine

## 2024-01-21 ENCOUNTER — Telehealth: Payer: Self-pay | Admitting: Family Medicine

## 2024-01-21 DIAGNOSIS — E785 Hyperlipidemia, unspecified: Secondary | ICD-10-CM

## 2024-01-21 DIAGNOSIS — I700 Atherosclerosis of aorta: Secondary | ICD-10-CM

## 2024-01-21 DIAGNOSIS — I2510 Atherosclerotic heart disease of native coronary artery without angina pectoris: Secondary | ICD-10-CM

## 2024-01-21 NOTE — Telephone Encounter (Signed)
 Copied from CRM #[id number]. Topic: Clinical - Lab/Test Results >> Jan 21, 2024 12:23 PM Zebedee SAUNDERS wrote: Reason for CRM: Please send imaging US  Renal (Accession [id number]) (Order [id number]) To fax [phone] Dr. Lauraine Lunger.Pt has appt tomorrow with Urologist.

## 2024-01-21 NOTE — Telephone Encounter (Signed)
 Faxed to number provided

## 2024-01-22 ENCOUNTER — Other Ambulatory Visit: Payer: Self-pay | Admitting: Nurse Practitioner

## 2024-01-22 DIAGNOSIS — N200 Calculus of kidney: Secondary | ICD-10-CM

## 2024-01-22 DIAGNOSIS — N1330 Unspecified hydronephrosis: Secondary | ICD-10-CM

## 2024-01-22 NOTE — Telephone Encounter (Signed)
 Too soon for refill.  Requested Prescriptions  Pending Prescriptions Disp Refills   rosuvastatin  (CRESTOR ) 20 MG tablet [Pharmacy Med Name: Rosuvastatin  Calcium  20 MG Oral Tablet] 90 tablet 0    Sig: Take 1 tablet by mouth once daily     Cardiovascular:  Antilipid - Statins 2 Failed - 01/22/2024  3:03 PM      Failed - Cr in normal range and within 360 days    Creat  Date Value Ref Range Status  06/21/2023 1.53 (H) 0.70 - 1.35 mg/dL Final         Failed - Lipid Panel in normal range within the last 12 months    Cholesterol, Total  Date Value Ref Range Status  11/19/2018 158 100 - 199 mg/dL Final   Cholesterol  Date Value Ref Range Status  06/21/2023 131 <200 mg/dL Final   LDL Cholesterol (Calc)  Date Value Ref Range Status  06/21/2023 69 mg/dL (calc) Final    Comment:    Reference range: <100 . Desirable range <100 mg/dL for primary prevention;   <70 mg/dL for patients with CHD or diabetic patients  with > or = 2 CHD risk factors. SABRA LDL-C is now calculated using the Martin-Hopkins  calculation, which is a validated novel method providing  better accuracy than the Friedewald equation in the  estimation of LDL-C.  Gladis APPLETHWAITE et al. SANDREA. 7986;689(80): 2061-2068  (http://education.[employer].com/faq/FAQ164)    HDL  Date Value Ref Range Status  06/21/2023 48 > OR = 40 mg/dL Final  89/72/7979 35 (L) >39 mg/dL Final   Triglycerides  Date Value Ref Range Status  06/21/2023 64 <150 mg/dL Final         Passed - Patient is not pregnant      Passed - Valid encounter within last 12 months    Recent Outpatient Visits           2 months ago Acute pain of left hip   [facility] Bernardo Fend, DO   3 months ago Chronic bilateral low back pain with left-sided sciatica   [facility] Glenard Mire, MD   7 months ago Well adult exam   [facility] Glenard Mire, MD   10 months ago  Essential hypertension   [facility] Glenard Mire, MD       Future Appointments             In 5 months Glenard, Krichna, MD [facility] [place]

## 2024-01-30 ENCOUNTER — Ambulatory Visit: Admitting: Family Medicine

## 2024-01-30 ENCOUNTER — Encounter: Payer: Self-pay | Admitting: Family Medicine

## 2024-01-30 VITALS — BP 162/102 | HR 90 | Temp 98.0°F | Resp 18 | Ht 73.0 in | Wt 255.1 lb

## 2024-01-30 DIAGNOSIS — I10 Essential (primary) hypertension: Secondary | ICD-10-CM

## 2024-01-30 MED ORDER — AMLODIPINE BESYLATE 2.5 MG PO TABS: 2.5000 mg | ORAL_TABLET | Freq: Every day | ORAL | 0 refills | Status: AC

## 2024-01-30 NOTE — Progress Notes (Signed)
 Name: Seth Meadows   MRN: [id number]    DOB: 1960/02/03   Date:01/30/2024       Progress Note  Subjective  Chief Complaint  Chief Complaint  Patient presents with   Hypertension    Thinks its due to back pain   Discussed the use of AI scribe software for clinical note transcription with the patient, who gave verbal consent to proceed.  History of Present Illness Seth Meadows is a 64 year old male with hypertension and chronic back pain who presents with elevated blood pressure and severe lower back pain.  He experiences severe lower back pain, rated as 10/10 when standing up or sitting down, which eases after standing for 10-15 seconds and diminishes with movement. He has a history of lumbar spine issues, with an MRI in November and a subsequent x-ray in December to check for any instability. He is scheduled to see a pain specialist tomorrow.  He recalls a past incident where a misstep before playing volleyball led to a significant bruise on his hip and leg, causing numbness and a limp. An x-ray and MRI were performed, revealing no major issues with the hip but identifying problems in the spine. Further imaging showed a left renal lesion, kidney stones, and cysts, which were evaluated by a nephrologist.  His blood pressure was found to be elevated during a dental visit earlier today , leading to the cancellation of a root canal procedure. He was unaware of the high blood pressure until this visit. No headaches, chest pain, or shortness of breath. He has been in pain for two months, with previous blood pressure readings of 150/94 in December and 130/76 in October. Today bp at dentist was in the 170's systolic readings  He is currently taking benazepril  20 mg for hypertension and has a blood pressure cuff at home to monitor his levels.    Patient Active Problem List   Diagnosis Date Noted   Adenomatous polyp of colon 07/04/2022   Vitamin B12 deficiency 11/15/2021   Elevated red blood cell  count 11/15/2021   Pinched nerve in shoulder, left 10/23/2019   OSA on CPAP 02/28/2016   Vitamin D  deficiency 08/31/2015   Dyslipidemia 03/01/2015   Erectile dysfunction of organic origin 10/29/2014   Arthritis of lumbar spine 10/22/2014   Intermittent low back pain 09/21/2014   Hypogonadism in male 07/30/2014   Essential hypertension     Past Surgical History:  Procedure Laterality Date   COLONOSCOPY WITH PROPOFOL  N/A 07/04/2022   Procedure: COLONOSCOPY WITH PROPOFOL ;  Surgeon: Therisa Bi, MD;  Location: [facility] ENDOSCOPY;  Service: Gastroenterology;  Laterality: N/A;   EYELID LACERATION REPAIR  01/2017   cut the the eye lid to drain the infection out   LEFT HEART CATH AND CORONARY ANGIOGRAPHY N/A 07/25/2022   Procedure: LEFT HEART CATH AND CORONARY ANGIOGRAPHY;  Surgeon: Anner Alm ORN, MD;  Location: [facility] INVASIVE CV LAB;  Service: Cardiovascular;  Laterality: N/A;   WISDOM TOOTH EXTRACTION     XI ROBOTIC LAPAROSCOPIC ASSISTED APPENDECTOMY N/A 02/07/2023   Procedure: XI ROBOTIC LAPAROSCOPIC ASSISTED APPENDECTOMY;  Surgeon: Desiderio Schanz, MD;  Location: [facility] ORS;  Service: General;  Laterality: N/A;    Family History  Problem Relation Age of Onset   Hypertension Mother    Diabetes Mother    Hypertension Father    Prostate cancer Father    Thyroid  disease Father    Skin cancer Sister    Diabetes Brother    Hypertension Brother    Kidney  disease Neg Hx    Bladder Cancer Neg Hx     Social History   Tobacco Use   Smoking status: Never    Passive exposure: Never   Smokeless tobacco: Never  Substance Use Topics   Alcohol use: No    Comment: occasional    Current Medications[1]  Allergies[2]  I personally reviewed active problem list, medication list, allergies, family history with the patient/caregiver today.   ROS  Ten systems reviewed and is negative except as mentioned in HPI    Objective Physical Exam  CONSTITUTIONAL: Patient appears well-developed and  well-nourished. No distress. HEENT: Head atraumatic, normocephalic, neck supple. CARDIOVASCULAR: Normal rate, regular rhythm and normal heart sounds. No murmur heard. No BLE edema. PULMONARY: Effort normal and breath sounds normal. No respiratory distress. ABDOMINAL: There is no tenderness or distention. MUSCULOSKELETAL: Normal gait but intense pain when shifting positions from sitting to standing PSYCHIATRIC: Patient has a normal mood and affect. Behavior is normal. Judgment and thought content normal.  Vitals:   01/30/24 1535 01/30/24 1602  BP: (!) 166/104 (!) 162/102  Pulse: 90   Resp: 18   Temp: 98 F (36.7 C)   SpO2: 98%   Weight: 255 lb 1.6 oz (115.7 kg)   Height: 6' 1 (1.854 m)     Body mass index is 33.66 kg/m.    PHQ2/9:    01/30/2024    3:34 PM 10/09/2023    8:37 AM 06/20/2023    9:16 AM 12/12/2022    3:19 PM 10/12/2022   10:58 AM  Depression screen PHQ 2/9  Decreased Interest 0 0 0 0 0  Down, Depressed, Hopeless 0 0 0 0 0  PHQ - 2 Score 0 0 0 0 0  Altered sleeping    0 0  Tired, decreased energy    0 0  Change in appetite    0 0  Feeling bad or failure about yourself     0 0  Trouble concentrating    0 0  Moving slowly or fidgety/restless    0 0  Suicidal thoughts    0 0  PHQ-9 Score    0  0   Difficult doing work/chores    Not difficult at all Not difficult at all     Data saved with a previous flowsheet row definition    phq 9 is negative  Fall Risk:    01/30/2024    3:34 PM 10/09/2023    8:37 AM 06/20/2023    9:15 AM 12/12/2022    3:11 PM 10/12/2022   10:58 AM  Fall Risk   Falls in the past year? 0 0 0 0 1  Number falls in past yr: 0 0 0 0 0  Injury with Fall? 0 0  0  0  1   Risk for fall due to : No Fall Risks No Fall Risks No Fall Risks  Impaired balance/gait  Follow up Falls evaluation completed Falls evaluation completed Falls prevention discussed;Education provided;Falls evaluation completed  Falls prevention discussed;Education provided;Falls  evaluation completed     Data saved with a previous flowsheet row definition      Assessment & Plan Uncontrolled  hypertension Hypertension exacerbated by acute pain. Blood pressure 162/102 mmHg. Current management includes benazepril . Amlodipine  added for control during pain episodes. Discussed side effects and need for monitoring. Emphasized importance of blood pressure control during dental procedures and pain management. - Continue benazepril  as prescribed. - Initiated amlodipine  2.5 mg at night. - Monitor blood pressure  at home. - Acceptable blood pressure in 140s with pain; continue amlodipine  if 150s-160s. - Discontinue amlodipine  if blood pressure decreases with pain control. - Consider shorter-acting medication like hydralazine   if blood pressure drops too low with norvasc  2.5 mg . - Sent prescription to pharmacy; advised use of GoodRx if cost is prohibitive. - Instructed to pay cash for one dose tonight if pharmacy unavailable. - Advised to seek emergency care for chest pain, palpitations, shortness of breath, or increasing pain.        [1]  Current Outpatient Medications:    aspirin  EC 81 MG tablet, Take 1 tablet (81 mg total) by mouth daily. Swallow whole., Disp: 90 tablet, Rfl: 3   benazepril  (LOTENSIN ) 20 MG tablet, Take 1 tablet (20 mg total) by mouth daily., Disp: 90 tablet, Rfl: 1   celecoxib  (CELEBREX ) 100 MG capsule, TAKE ONE CAPSULE BY MOUTH TWICE DAILY FOR TWO WEEKS AND AFTER THAT TAKE AS NEEDED, Disp: 180 capsule, Rfl: 1   cholecalciferol (VITAMIN D3) 25 MCG (1000 UNIT) tablet, Take 1,000 Units by mouth daily., Disp: , Rfl:    Cyanocobalamin (VITAMIN B-12) 3000 MCG SUBL, Place 3,000 mcg under the tongue every other day., Disp: , Rfl:    metaxalone  (SKELAXIN ) 800 MG tablet, Take 1 tablet (800 mg total) by mouth 3 (three) times daily as needed for muscle spasms., Disp: 90 tablet, Rfl: 1   methylPREDNISolone  (MEDROL  DOSEPAK) 4 MG TBPK tablet, Take by mouth daily,  taper daily dose per package instructions., Disp: 21 tablet, Rfl: 0   Multiple Vitamins-Minerals (MULTIVITAMIN WITH MINERALS) tablet, Take 1 tablet by mouth daily., Disp: , Rfl:    Papav-Phentolamine-Alprostadil 150-5-50 MG-MG-MCG SOLR, 1 each by Intracavernosal route daily as needed (ED). Trimix, Disp: , Rfl:    rosuvastatin  (CRESTOR ) 20 MG tablet, Take 1 tablet (20 mg total) by mouth daily., Disp: 90 tablet, Rfl: 1   SYRINGE-NEEDLE, DISP, 3 ML (MONOJECT SAFETY SYRINGE/SHIELD) 23G X 1 3 ML MISC, Inject 1mL intramuscular every 10days, Disp: , Rfl:    tadalafil (CIALIS) 20 MG tablet, Take 20 mg by mouth daily as needed., Disp: , Rfl:    testosterone  cypionate (DEPOTESTOSTERONE CYPIONATE) 200 MG/ML injection, Inject 0.3 mLs into the muscle once a week., Disp: , Rfl:    valACYclovir (VALTREX) 1000 MG tablet, Take by mouth., Disp: , Rfl:  [2]  Allergies Allergen Reactions   Valsartan  Other (See Comments)    diarrhea

## 2024-02-01 ENCOUNTER — Encounter: Payer: Self-pay | Admitting: Family Medicine

## 2024-02-23 ENCOUNTER — Encounter: Payer: Self-pay | Admitting: *Deleted

## 2024-04-07 ENCOUNTER — Ambulatory Visit: Admitting: Family Medicine

## 2024-06-20 ENCOUNTER — Encounter: Admitting: Family Medicine
